# Patient Record
Sex: Female | Born: 1935
Health system: Southern US, Community
[De-identification: ages and names within clinical notes are randomized; demographics above are authoritative.]

## PROBLEM LIST (undated history)

## (undated) DIAGNOSIS — Z86718 Personal history of other venous thrombosis and embolism: Secondary | ICD-10-CM

## (undated) DIAGNOSIS — I10 Essential (primary) hypertension: Secondary | ICD-10-CM

## (undated) DIAGNOSIS — E785 Hyperlipidemia, unspecified: Secondary | ICD-10-CM

## (undated) DIAGNOSIS — E119 Type 2 diabetes mellitus without complications: Secondary | ICD-10-CM

## (undated) DIAGNOSIS — F329 Major depressive disorder, single episode, unspecified: Secondary | ICD-10-CM

## (undated) DIAGNOSIS — F32A Depression, unspecified: Secondary | ICD-10-CM

## (undated) HISTORY — DX: Depression, unspecified: F32.A

## (undated) HISTORY — DX: Major depressive disorder, single episode, unspecified: F32.9

## (undated) HISTORY — DX: Personal history of other venous thrombosis and embolism: Z86.718

## (undated) HISTORY — DX: Type 2 diabetes mellitus without complications: E11.9

## (undated) HISTORY — PX: LEG AMPUTATION: SHX1105

## (undated) HISTORY — DX: Essential (primary) hypertension: I10

## (undated) HISTORY — DX: Hyperlipidemia, unspecified: E78.5

---

## 2005-07-04 LAB — HEPATIC FUNCTION PANEL
ALT: 15 U/L (ref 7–35)
AST: 15 U/L (ref 13–35)
Alkaline Phosphatase: 84 U/L (ref 25–125)
Bilirubin, Total: 0.3 mg/dL

## 2005-07-04 LAB — BASIC METABOLIC PANEL
BUN: 9 mg/dL (ref 4–21)
Glucose: 149 mg/dL
POTASSIUM: 4.5 mmol/L (ref 3.4–5.3)
Sodium: 138 mmol/L (ref 137–147)

## 2005-07-04 LAB — LIPID PANEL
Cholesterol: 273 mg/dL — AB (ref 0–200)
HDL: 40 mg/dL (ref 35–70)
LDL Cholesterol: 197 mg/dL
LDL/HDL RATIO: 4.9
Triglycerides: 182 mg/dL — AB (ref 40–160)

## 2005-07-04 LAB — TSH: TSH: 1.62 u[IU]/mL (ref <=5.90)

## 2005-07-06 DIAGNOSIS — E139 Other specified diabetes mellitus without complications: Secondary | ICD-10-CM | POA: Insufficient documentation

## 2005-07-06 DIAGNOSIS — E785 Hyperlipidemia, unspecified: Secondary | ICD-10-CM | POA: Insufficient documentation

## 2005-07-06 HISTORY — DX: Hyperlipidemia, unspecified: E78.5

## 2005-07-18 ENCOUNTER — Ambulatory Visit: Payer: Self-pay | Admitting: Family Medicine

## 2005-08-12 ENCOUNTER — Ambulatory Visit: Payer: Self-pay | Admitting: Family Medicine

## 2011-12-17 ENCOUNTER — Ambulatory Visit: Payer: Self-pay | Admitting: Vascular Surgery

## 2011-12-17 LAB — CREATININE, SERUM
Creatinine: 0.41 mg/dL — ABNORMAL LOW (ref 0.60–1.30)
EGFR (African American): >60

## 2011-12-17 LAB — BUN: BUN: 9 mg/dL (ref 7–18)

## 2011-12-18 ENCOUNTER — Inpatient Hospital Stay: Payer: Self-pay | Admitting: Podiatry

## 2011-12-18 LAB — CBC WITH DIFFERENTIAL/PLATELET
Basophil #: 0.1 10*3/uL (ref 0.0–0.1)
Basophil %: 0.6 %
Basophil %: 0.8 %
Eosinophil #: 0.1 10*3/uL (ref 0.0–0.7)
Eosinophil %: 0.7 %
HCT: 31.2 % — ABNORMAL LOW (ref 35.0–47.0)
HGB: 10.4 g/dL — ABNORMAL LOW (ref 12.0–16.0)
HGB: 10.3 g/dL — ABNORMAL LOW (ref 12.0–16.0)
Lymphocyte #: 2.5 10*3/uL (ref 1.0–3.6)
Lymphocyte #: 2.7 10*3/uL (ref 1.0–3.6)
Lymphocyte %: 13.6 %
Lymphocyte %: 13.9 %
MCH: 27.6 pg (ref 26.0–34.0)
MCH: 27.9 pg (ref 26.0–34.0)
MCHC: 33.5 g/dL (ref 32.0–36.0)
MCHC: 33.4 g/dL (ref 32.0–36.0)
MCV: 83 fL (ref 80–100)
Monocyte #: 1.9 x10 3/mm — ABNORMAL HIGH (ref 0.2–0.9)
Monocyte %: 10.3 %
Neutrophil #: 13.8 10*3/uL — ABNORMAL HIGH (ref 1.4–6.5)
Neutrophil #: 14.6 10*3/uL — ABNORMAL HIGH (ref 1.4–6.5)
Neutrophil %: 74.8 %
Neutrophil %: 73.5 %
Platelet: 422 10*3/uL (ref 150–440)
Platelet: 410 10*3/uL (ref 150–440)
RBC: 3.78 10*6/uL — ABNORMAL LOW (ref 3.80–5.20)
RBC: 3.69 10*6/uL — ABNORMAL LOW (ref 3.80–5.20)
RDW: 13.7 % (ref 11.5–14.5)
RDW: 13.7 % (ref 11.5–14.5)
WBC: 18.5 10*3/uL — ABNORMAL HIGH (ref 3.6–11.0)
WBC: 19.8 10*3/uL — ABNORMAL HIGH (ref 3.6–11.0)

## 2011-12-18 LAB — SEDIMENTATION RATE: Erythrocyte Sed Rate: 119 mm/hr — ABNORMAL HIGH (ref 0–30)

## 2011-12-18 LAB — BASIC METABOLIC PANEL
BUN: 8 mg/dL (ref 7–18)
Chloride: 95 mmol/L — ABNORMAL LOW (ref 98–107)
Co2: 26 mmol/L (ref 21–32)
Creatinine: 0.58 mg/dL — ABNORMAL LOW (ref 0.60–1.30)
EGFR (Non-African Amer.): >60
Glucose: 396 mg/dL — ABNORMAL HIGH (ref 65–99)
Potassium: 4.5 mmol/L (ref 3.5–5.1)
Sodium: 131 mmol/L — ABNORMAL LOW (ref 136–145)

## 2011-12-20 LAB — CBC WITH DIFFERENTIAL/PLATELET
Eosinophil #: 0.2 10*3/uL (ref 0.0–0.7)
Eosinophil %: 1.1 %
HCT: 28.7 % — ABNORMAL LOW (ref 35.0–47.0)
Lymphocyte #: 2.9 10*3/uL (ref 1.0–3.6)
MCV: 82 fL (ref 80–100)
Monocyte %: 9.8 %
Neutrophil #: 13.4 10*3/uL — ABNORMAL HIGH (ref 1.4–6.5)
RBC: 3.5 10*6/uL — ABNORMAL LOW (ref 3.80–5.20)
WBC: 18.5 10*3/uL — ABNORMAL HIGH (ref 3.6–11.0)

## 2011-12-20 LAB — APTT: Activated PTT: 53.6 secs — ABNORMAL HIGH (ref 23.6–35.9)

## 2011-12-21 LAB — CBC WITH DIFFERENTIAL/PLATELET
Basophil #: 0.1 10*3/uL (ref 0.0–0.1)
Basophil %: 0.8 %
Eosinophil %: 1 %
HGB: 9.8 g/dL — ABNORMAL LOW (ref 12.0–16.0)
Lymphocyte #: 2.5 10*3/uL (ref 1.0–3.6)
MCV: 82 fL (ref 80–100)
Monocyte #: 1.7 x10 3/mm — ABNORMAL HIGH (ref 0.2–0.9)
Monocyte %: 9.4 %
Neutrophil %: 75.3 %
Platelet: 440 10*3/uL (ref 150–440)
RBC: 3.52 10*6/uL — ABNORMAL LOW (ref 3.80–5.20)
WBC: 18.5 10*3/uL — ABNORMAL HIGH (ref 3.6–11.0)

## 2011-12-21 LAB — APTT: Activated PTT: 56.4 secs — ABNORMAL HIGH (ref 23.6–35.9)

## 2011-12-22 LAB — CBC WITH DIFFERENTIAL/PLATELET
Eosinophil #: 0.2 10*3/uL (ref 0.0–0.7)
MCH: 28.3 pg (ref 26.0–34.0)
MCHC: 34.6 g/dL (ref 32.0–36.0)
Monocyte #: 1.6 x10 3/mm — ABNORMAL HIGH (ref 0.2–0.9)
Neutrophil #: 11.1 10*3/uL — ABNORMAL HIGH (ref 1.4–6.5)
Neutrophil %: 73.8 %
Platelet: 452 10*3/uL — ABNORMAL HIGH (ref 150–440)
RBC: 3.35 10*6/uL — ABNORMAL LOW (ref 3.80–5.20)

## 2011-12-22 LAB — BASIC METABOLIC PANEL
Anion Gap: 9 (ref 7–16)
BUN: 5 mg/dL — ABNORMAL LOW (ref 7–18)
Calcium, Total: 8.5 mg/dL (ref 8.5–10.1)
Chloride: 99 mmol/L (ref 98–107)
Osmolality: 269 (ref 275–301)
Potassium: 3.6 mmol/L (ref 3.5–5.1)
Sodium: 134 mmol/L — ABNORMAL LOW (ref 136–145)

## 2011-12-22 LAB — APTT: Activated PTT: 28.4 secs (ref 23.6–35.9)

## 2011-12-23 LAB — WBC: WBC: 13.2 10*3/uL — ABNORMAL HIGH (ref 3.6–11.0)

## 2011-12-23 LAB — APTT
Activated PTT: 36.5 secs — ABNORMAL HIGH (ref 23.6–35.9)
Activated PTT: 41.2 secs — ABNORMAL HIGH (ref 23.6–35.9)

## 2011-12-23 LAB — PROTIME-INR
INR: 1.1
Prothrombin Time: 14.9 secs — ABNORMAL HIGH (ref 11.5–14.7)

## 2011-12-24 LAB — PLATELET COUNT: Platelet: 476 10*3/uL — ABNORMAL HIGH (ref 150–440)

## 2011-12-24 LAB — PROTIME-INR: INR: 1.4

## 2011-12-24 LAB — HEMOGLOBIN: HGB: 8.2 g/dL — ABNORMAL LOW (ref 12.0–16.0)

## 2011-12-24 LAB — APTT: Activated PTT: 43.4 secs — ABNORMAL HIGH (ref 23.6–35.9)

## 2011-12-25 LAB — CBC WITH DIFFERENTIAL/PLATELET
Basophil #: 0.1 10*3/uL (ref 0.0–0.1)
Basophil %: 0.5 %
Eosinophil #: 0.3 10*3/uL (ref 0.0–0.7)
HCT: 26.2 % — ABNORMAL LOW (ref 35.0–47.0)
HGB: 8.8 g/dL — ABNORMAL LOW (ref 12.0–16.0)
Lymphocyte #: 3.1 10*3/uL (ref 1.0–3.6)
Lymphocyte %: 28.1 %
MCH: 27.9 pg (ref 26.0–34.0)
MCHC: 33.8 g/dL (ref 32.0–36.0)
MCV: 83 fL (ref 80–100)
Monocyte #: 1.3 x10 3/mm — ABNORMAL HIGH (ref 0.2–0.9)
Monocyte %: 11.8 %
Neutrophil #: 6.4 10*3/uL (ref 1.4–6.5)
Neutrophil %: 57.3 %
Platelet: 532 10*3/uL — ABNORMAL HIGH (ref 150–440)
WBC: 11.1 10*3/uL — ABNORMAL HIGH (ref 3.6–11.0)

## 2011-12-25 LAB — PROTIME-INR
INR: 3.5
Prothrombin Time: 35.1 secs — ABNORMAL HIGH (ref 11.5–14.7)

## 2011-12-25 LAB — SEDIMENTATION RATE: Erythrocyte Sed Rate: >140 mm/hr — ABNORMAL HIGH (ref 0–30)

## 2011-12-25 LAB — WOUND CULTURE

## 2011-12-26 LAB — PATHOLOGY REPORT

## 2011-12-30 LAB — WOUND CULTURE

## 2012-01-11 ENCOUNTER — Ambulatory Visit: Payer: Self-pay | Admitting: Podiatry

## 2012-01-15 LAB — WOUND CULTURE

## 2012-02-20 DIAGNOSIS — M869 Osteomyelitis, unspecified: Secondary | ICD-10-CM | POA: Insufficient documentation

## 2012-02-20 DIAGNOSIS — I96 Gangrene, not elsewhere classified: Secondary | ICD-10-CM | POA: Insufficient documentation

## 2012-03-03 ENCOUNTER — Ambulatory Visit: Payer: Self-pay | Admitting: Orthopaedic Surgery

## 2012-06-18 DIAGNOSIS — S88919A Complete traumatic amputation of unspecified lower leg, level unspecified, initial encounter: Secondary | ICD-10-CM | POA: Insufficient documentation

## 2012-08-18 ENCOUNTER — Emergency Department: Payer: Self-pay | Admitting: Emergency Medicine

## 2013-01-08 ENCOUNTER — Ambulatory Visit: Payer: Medicare Other | Attending: Infectious Diseases | Admitting: Physical Therapy

## 2013-01-08 DIAGNOSIS — Z5189 Encounter for other specified aftercare: Secondary | ICD-10-CM | POA: Insufficient documentation

## 2013-01-08 DIAGNOSIS — R5381 Other malaise: Secondary | ICD-10-CM | POA: Insufficient documentation

## 2013-01-08 DIAGNOSIS — S88119A Complete traumatic amputation at level between knee and ankle, unspecified lower leg, initial encounter: Secondary | ICD-10-CM | POA: Insufficient documentation

## 2013-01-08 DIAGNOSIS — R269 Unspecified abnormalities of gait and mobility: Secondary | ICD-10-CM | POA: Insufficient documentation

## 2013-10-06 LAB — HEMOGLOBIN A1C: Hgb A1c MFr Bld: 7.1 % — AB (ref 4.0–6.0)

## 2014-08-31 NOTE — Consult Note (Signed)
Brief Consult Note: Diagnosis: 1. Diabetic foot infection and gangrene.   Patient was seen by consultant.   Consult note dictated.   Recommend further assessment or treatment.   Orders entered.   Comments: Given PVD, very poorly controlled Dm, some deeper purulence noted at surgery and unhealthy appearance of wound would rec at least 2 weeks of IV abx - place picc (order placed) - d/c vanco (No MRSA identified) and cipro (needs better coverage including anaerobes) - start ertapenem for today - will discuss with home health whether they can provide zosyn pump which makes it easier to dose - if not may need the ertapenem for ease of dosing. Thank you for consult. Will follow with you and would like to see pt as otpt prior to changes in her abx regimen.  Electronic Signatures: Angelena Form (MD)  (Signed 12-Aug-13 19:18)  Authored: Brief Consult Note   Last Updated: 12-Aug-13 19:18 by Angelena Form (MD)

## 2014-08-31 NOTE — Consult Note (Signed)
Brief Consult Note: Comments: 81 yr old female admitted to podiatry services for possible right foot amputation/ for non healing ulcer on right foot;get wound cultures,continue cipro and clinda,get basic labs and EKG htn:no h/o htn;add bbloker will follow.  Electronic Signatures: Epifanio Lesches (MD)  (Signed 06-Aug-13 18:55)  Authored: Brief Consult Note   Last Updated: 06-Aug-13 18:55 by Epifanio Lesches (MD)

## 2014-08-31 NOTE — Op Note (Signed)
PATIENT NAME:  Melanie Diaz, Melanie Diaz MR#:  [id number] DATE OF BIRTH:  [birth year]  DATE OF PROCEDURE:  12/21/2011  PREOPERATIVE DIAGNOSIS: Right foot gangrene.   POSTOPERATIVE DIAGNOSIS: Right foot gangrene.   PROCEDURE: Transmetatarsal amputation right midfoot.   SURGEON: Raymond Azure A. Vickki Muff, DPM  COMPLICATIONS: None.   SPECIMEN: Swab for culture and sensitivity as well as forefoot amputation for pathology.   COMPLICATIONS: None.   HEMOSTASIS: None.   ANESTHESIA: MAC with ankle block.   OPERATIVE INDICATIONS: This is a 81 year old female who underwent angioplasty for blockages to her lower extremity who presented to the outpatient clinic with gangrenous changes of her great toe and second toe and was admitted. She was found to have an elevated white blood cell count with worsening discoloration of her third toe. We have discussed surgical intervention including a transmetatarsal amputation and she has given consent for this procedure.   DESCRIPTION OF PROCEDURE: The patient was brought into the OR and placed on the operating table in the supine position. IV sedation was administered by the anesthesia team. An ankle block was placed by myself. The right lower extremity was prepped and draped in the usual sterile fashion. Attention was directed to the forefoot area where basically the first and second metatarsals all the way to the base were noted to be quite gangrenous. The incision was mapped out. A full thickness incision was made along the base of the first and second metatarsals. This was carried distally to the third MTPJ lateral to the fifth MTPJ with a full thickness incision plantar from the fifth to the third MTPJ and brought back once again more proximal on the first metatarsal base area due to the gangrenous changes and brought back around the midfoot. Full thickness incision was made. The lateral portion was freed of the overlying soft tissue from the bone. At this time with a power saw at  the base of the first metatarsal keeping the metatarsal parabola intact, the metatarsals were all osteotomized one through five. These were disarticulated and removed from the surgical field in toto. A laterally based flap from the third to the fifth MTPJ had been created with this. There was noted to be a fair amount of purulent drainage throughout the medial one-half of this forefoot amputation site. There was noted to be some necrotic changes to the muscle and deep layers. There was noted to be areas of purulent drainage coursing along the dorsal extensor hallucis longus tendon as well as into the deep midfoot space where the FDL and FHL were coursing from as well as medially along the first ray. This was all debrided, opened up, and flushed out. All areas were then debrided with a Versajet. The laterally based flap was brought over medially. I was able to obtain a fairly good closure with this. Some dog ears were trimmed back. The medial portion of the incision site was left and packed open where most of the purulent drainage was noted. The dorsal and medial portion was closed loosely with nylon. A bulky sterile dressing was placed on the right foot. She tolerated the procedure and anesthesia well and was transported from the OR to the PAC-U with all vital signs stable and neurovascular status intact. Good perfusion to the flap was noted. There was not noted to be any severe bleeding from the wound. There was no obvious open arterial areas.   She will be readmitted to the floor. Will continue with vancomycin awaiting further culture results. Possibly place a Wound  VAC to this to assist with closure. She is at high risk of undergoing a below-the-knee amputation given the severe gangrenous changes and infection to the area. Will monitor her white blood cell count as well.   ____________________________ Pete Glatter. Vickki Muff, DPM jaf:drc D: 12/21/2011 15:28:33 ET T: 12/21/2011 16:14:42  ET JOB#: [id number]  cc: Larkin Ina A. Vickki Muff, DPM, <Dictator> Edna Rede DPM ELECTRONICALLY SIGNED 12/28/2011 9:36

## 2014-08-31 NOTE — Op Note (Signed)
PATIENT NAME:  Melanie Diaz, Melanie Diaz MR#:  [id number] DATE OF BIRTH:  [birth year]  DATE OF PROCEDURE:  12/17/2011  PREOPERATIVE DIAGNOSES:  1. Peripheral arterial disease with gangrene, right lower extremity.  2. Hypertension.  3. Ulceration, right foot.   POSTOPERATIVE DIAGNOSES: 1. Peripheral arterial disease with gangrene, right lower extremity.  2. Hypertension.  3. Ulceration, right foot.  PROCEDURES:  1. Ultrasound guidance for vascular access, left femoral artery.  2. Catheter placement into right dorsalis pedis artery from left femoral approach.  3. Aortogram and selective right lower extremity angiogram.  4. Percutaneous transluminal angioplasty right dorsalis pedis artery with 2 mm diameter angioplasty balloon.  5. Percutaneous transluminal angioplasty of right anterior tibial artery with 3 mm diameter angioplasty balloon.  6. Percutaneous transluminal angioplasty of long segment right SFA and popliteal artery for chronic total occlusion with 5 mm diameter angioplasty balloon.  7. Self-expanding stent x2 to right SFA and above-knee popliteal artery for greater than 50% residual stenosis and dissection after angioplasty.  8. StarClose closure device left femoral artery.   SURGEON: Algernon Huxley, MD   ANESTHESIA: Local with moderate conscious sedation.   ESTIMATED BLOOD LOSS: 25 mL.  FLUOROSCOPY TIME: Approximately 15 minutes.  CONTRAST USED: 120 mL.   INDICATION FOR PROCEDURE: This is a 81 year old white female who I saw in the office last week for nonhealing ulceration of the right lower extremity with an ABI of 0.27. She was set up for angiogram within a week's time. Since that time she has progressed to some gangrenous changes of the left great toe with worsening pain. We discussed that for any attempt at limb salvage revascularization will be required and she is brought here for angiography with possible revascularization today. Risks and benefits were discussed. Informed  consent was obtained.   DESCRIPTION OF PROCEDURE: The patient is brought to the Vascular Interventional Radiology Suite. Groins were shaved and prepped and a sterile surgical field was created. The left femoral head was localized with fluoroscopy. The left femoral artery was visualized with ultrasound and accessed under direct ultrasound guidance without difficulty with a Seldinger needle. A J-wire and 5 French sheath was placed. Pigtail catheter was placed in the aorta at the L1 level and AP aortogram was performed. This showed patent renal arteries bilaterally. The distal aorta was highly calcific but not stenotic and the iliacs appeared patent. I crossed the aortic bifurcation with a Terumo advantage wire and the pigtail catheter without difficulty and advanced the pigtail catheter into the right common femoral artery. Selective right lower extremity angiogram was then performed. This showed the first few centimeters of the superficial femoral artery to be patent but then was occluded over short to medium segment. It reconstituted in the distal superficial femoral artery and then re-occluded a few centimeters more distally and did not reconstitute until the popliteal artery around the level of the knee. There appeared to be an anterior tibial artery which was the best runoff distally. The peroneal artery came off the same level as the anterior tibial artery. The posterior tibial artery was not well seen but there was a vessel at the level of the knee that may have been an aberrant origin of the posterior tibial artery which was occluded. It did not reconstitute distally to be able to evaluate this further. The patient was given 4000 units of intravenous heparin for systemic anti-coagulation and a 6 French Ansel sheath was placed over a Terumo advantage wire. With a mild amount of difficulty I was  able to cross the lesions. I was able to redirect the wire into the true lumen and the below-knee popliteal artery  and confirm intraluminal flow distally. The anterior tibial artery was the best runoff but there was some disease proximally within the anterior tibial artery. Distal opacification into the foot was not particularly brisk initially but there was some disease within the foot as well. A 5 mm diameter angioplasty balloon was inflated from the below-knee popliteal artery to the proximal superficial femoral artery. Wastes were taken in multiple locations. Following angioplasty there was significant dissection and stenosis throughout much of the mid to distal superficial femoral artery and above-knee popliteal artery. Two 6 mm diameter self-expanding stents were used to treat from just above the knee to the proximal superficial femoral artery to resolve this. This was ironed out with a 5 mm balloon with good angiographic completion result. However, runoff distally appeared somewhat limited. The anterior tibial artery flow was stenotic proximally and this was the best runoff to the foot and there was disease within the foot. I was able taken an 0.018 wire and park this into the first digital artery and a 3 mm diameter angioplasty balloon was inflated in the proximal anterior tibial artery with excellent angiographic completion result. A 2 mm diameter angioplasty balloon was inflated in the dorsalis pedis artery. It was a bit difficult to see how much improved the flow was given patient motion but there was flow into the foot at this point in line from a sheath parked in the common femoral artery. At this point I elected to terminate the procedure. The sheath was pulled back to the ipsilateral external iliac artery and oblique arteriogram was performed. StarClose closure device was deployed in the usual fashion with excellent hemostatic result. The patient tolerated the procedure well and was taken to the recovery room in stable condition.   ____________________________ Algernon Huxley, MD jsd:drc D: 12/17/2011 11:19:39  ET T: 12/17/2011 11:42:12 ET JOB#: [id number]  cc: Algernon Huxley, MD, <Dictator> Richard L. Rosanna Randy, MD Algernon Huxley MD ELECTRONICALLY SIGNED 12/19/2011 11:01

## 2014-08-31 NOTE — Consult Note (Signed)
PATIENT NAME:  Melanie Diaz, Melanie Diaz MR#:  [id number] DATE OF BIRTH:  11/13/1933  DATE OF CONSULTATION:  12/24/2011  REFERRING PHYSICIAN:  Samara Deist, DPM  CONSULTING PHYSICIAN:  Cheral Marker. Ola Spurr, MD  REASON FOR CONSULT:  Antibiotic management of a complicated diabetic foot infection.  HISTORY OF PRESENT ILLNESS:  Melanie Diaz is a 81 year old with very poorly controlled diabetes who was admitted on 08/06 for gangrenous infected foot. She had had a very poor vascular supply to that foot and had a nonhealing ulcer on the right foot for several months.  She was being followed by Dr. Lucky Cowboy in vascular and Dr. Vickki Muff in podiatry.   She underwent transmetatarsal amputation of the right foot by Dr. Vickki Muff on 08/09. We are consulted for antibiotic management and review of her complicated culture data.  The patient apparently was not aware of her diabetes prior to this admission.  She was not seeking regular medical care at that time.  Since the amputation the patient continues to have some pain in the foot and some bleeding from the site that is reported as slowly improving.  PAST MEDICAL HISTORY: 1. Previously undiagnosed diabetes. 2. Peripheral vascular disease followed by Dr. Lucky Cowboy.  PAST SURGICAL HISTORY:  None.  ALLERGIES:  The patient is allergic to Augmentin, which causes stomach upset, and Keflex with an unclear reaction.  SOCIAL HISTORY:  The patient is married.  She does not smoke, does not drink.  FAMILY HISTORY:  No hypertension or coronary artery disease.  REVIEW OF SYSTEMS:  11 systems reviewed and negative except as per History of Present Illness.   MEDICATIONS:  Currently the patient is on: 1. Ciprofloxacin 500 mg twice a day orally. 2. Metoprolol 25 mg twice a day. 3. Sliding scale insulin. 4. Percocet p.r.n.  5. Morphine p.r.n.  6. Insulin detemir 20 units once a day. 7. Metformin 500 twice a day. 8. Magnesium hydroxide. 9. Robaxin. 10. Vancomycin 750 q. 18  hours. 11. Lovenox. 12. Coumadin. 13. Glucerna.  PHYSICAL EXAMINATION: VITAL SIGNS:  Temperature over the last 24 hours has been 100.3, pulse 88, blood pressure 127/56, pulse oximetry 90%.  GENERAL:  She is a somewhat disheveled, elderly woman in no acute distress.  HEENT:  Pupils equal, round, reactive to light and accommodation. Extraocular movements intact.  Oropharynx is clear.  NECK:  Supple.  HEART:  Regular.  LUNGS:  Clear.  ABDOMEN:  Soft, nontender, nondistended.  EXTREMITIES:  She has no edema in her left foot.  On her right foot she has a transmetatarsal amputation site with some dusky appearance at the site of the stitches.  The right foot is somewhat warm.  There is no purulence but some erythema around the margins of the operative site.  DATA:  The patient's white blood count on admission was elevated at 18.5 but has decreased today since the surgery and antibiotics to 15.1 on 08/10.  Hemoglobin 9.5, platelets 452.  White blood count on 08/11 is 13.2.  Creatinine on 08/10 was normal at 0.42 and BUN 5.  Estimated GFR of greater than 60.    Microbiology data from intraoperative cultures on 08/09 grew heavy growth of group B strep.  Preoperative cultures of necrotic area that was debrided grew the following organisms: 1. Methicillin-sensitive Staphylococcus aureus that was initially reported as resistant to vancomycin but on follow-up testing is relatively sensitive, resistant only to clindamycin.  2. Group B strep, heavy growth. 3. Moderate growth of Providencia, sensitive to ceftriaxone, ciprofloxacin, ceftazidime, imipenem, Bactrim, levofloxacin,  and cefoxitin.  It is resistant to cefazolin and ampicillin. 4. Acinetobacter lwoffi. This was sensitive to ceftriaxone, ciprofloxacin, gentamicin, ceftazidime, and imipenem.  It was intermediate to levofloxacin. 5. Bacteroides stercoris, beta-lactamase positive.  IMPRESSION:  Gangrenous foot in a woman with very poorly  controlled diabetes, peripheral vascular disease, now status post transmetatarsal amputation.  Per my discussion with Dr. Vickki Muff, there was necrotic material and areas of purulence in the remaining part of the foot that was not amputated.  This was debrided and washed out.  Her white count has decreased but she is still having low-grade fevers and the site looks dusky, somewhat erythematous, and warm.  At this point I think she would benefit from her multiple comorbidities and poor vascular supply from at least a two-week course of IV antibiotics.  Given the recent identification of the Bacteroides, we would definitely need to add anaerobic coverage, which is currently lacking.   RECOMMENDATIONS: 1. Place a PICC line for at least two weeks, potentially four, of antibiotics. 2. At this point I would recommend dosing with a carbapenem in order to achieve the best coverage with the simplest regimen.  Another option would be Zosyn, although it may not be as easy to administer this at home.  I will check with home infusion as to whether they have continuous pumps that can be used to administer Zosyn. 3. The foot should be monitored closely and she may end up needing below-the-knee amputation. 4. She needs better control of her diabetes and her primary care physician needs to be working on this. 5. Following the IV antibiotic course I will see her as an outpatient and may need to extend an oral antibiotic course to ensure clearance of this infection.     Thank you very much for the consult.  We will be glad to follow along with you.    ____________________________ Cheral Marker. Ola Spurr, MD dpf:bjt D: 12/24/2011 19:03:04 ET T: 12/25/2011 10:15:41 ET JOB#: [id number]  cc: Cheral Marker. Ola Spurr, MD, <Dictator> DAVID Ola Spurr MD ELECTRONICALLY SIGNED 01/01/2012 12:36

## 2014-08-31 NOTE — Op Note (Signed)
PATIENT NAME:  Melanie Diaz, Melanie Diaz MR#:  [id number] DATE OF BIRTH:  08-05-1933  DATE OF PROCEDURE:  01/11/2012  PREOPERATIVE DIAGNOSIS: Gangrene transmetatarsal amputation flap, right foot.   POSTOPERATIVE DIAGNOSIS: Gangrene transmetatarsal amputation flap, right foot.   PROCEDURES:  1. Debridement of necrotic gangrenous tissue. 2. Preparation of wound site for skin graft placement.  3. Application of Apligraf skin graft.  4. Application of wound VAC, right foot.   SURGEON: Edilia Ghuman A. Vickki Muff, DPM  COMPLICATIONS: None.   SPECIMEN: Wound culture, right foot.   HEMOSTASIS: None.   ANESTHESIA: MAC, IV sedation with ankle block.   OPERATIVE INDICATIONS: This is a 81 year old female who has been seen in the outpatient clinic. She underwent a transmetatarsal amputation approximately 2-1/2 weeks ago. She had evidence of necrosis of the skin flap, developed a full thickness gangrenous changes to this. She presents today for debridement of the skin flap, revision of the wound bed, placement of an Apligraf and a wound VAC. All risks, benefits, alternatives, and complications associated with the procedure were discussed with the patient and informed consent has been given.   DESCRIPTION OF PROCEDURE: Patient was brought into the OR, placed the operating table in the supine position. IV sedation was administered by the anesthesia team. An ankle block was placed by myself. The right lower extremity was then prepped and draped in the usual sterile fashion. Attention was directed to the right foot where after exposure of the skin flap there was noted to be a full thickness necrosis of the skin flap to the right foot. This was the medial one-half of the transmetatarsal amputation flap. This was debrided of all necrotic tissue. This was taken down with some mild superficial residual skin that was left in the area but approximately 80% of the flap full thickness had died. I did debride the skin edges and deeper  tissue with the Versajet. I did debride and remove with a power saw a small amount of bone to allow for bleeding into the tissues and mild skin closure dorsally. This was basically along the navicular cuneiform region. At this time a wound culture was taken. I then prepped the Apligraf. Basically the complete sheet of Apligraf, 44 sq cm, were applied to the wound bed. This had been fenestrated to allow for drainage. This was then covered with a Mepitel nonadhesive covering and a sponge for the wound VAC was placed over top of this and the wound VAC was then fixated to the area with a good seal noted. No active bleeding was noted during the procedure, just mild drainage from the bone and superficial skin as expected. This was then covered with a light gauze dressing. She was transported from the Operating Room to the PAC-U with all vital signs stable and neurovascular status intact. She will be readmitted to be nursing facility. Her Coumadin will be restarted tomorrow evening. Orders have already been written for this. I will see her in the outpatient clinic in five days where I will perform the wound VAC change at that point.   ____________________________ Pete Glatter. Vickki Muff, DPM jaf:cms D: 01/11/2012 13:08:52 ET T: 01/11/2012 13:25:16 ET JOB#: [id number]  cc: Larkin Ina A. Vickki Muff, DPM, <Dictator>  Tong Pieczynski DPM ELECTRONICALLY SIGNED 01/15/2012 13:37

## 2014-08-31 NOTE — Consult Note (Signed)
Patient admitted with worsening gangrene of the toes of the right foot.site well healedpulse now presentas if she will need a TMA or at least multiple toes removed, and this is being considered for later this week.follow  Electronic Signatures: Algernon Huxley (MD)  (Signed on 07-Aug-13 14:44)  Authored  Last Updated: 07-Aug-13 14:44 by Algernon Huxley (MD)

## 2014-08-31 NOTE — Consult Note (Signed)
Brief Consult Note: Diagnosis: Infected foot wound- s/p amputation.   Recommend further assessment or treatment.   Orders entered.   Comments: Called by Dr Vickki Muff to review micro results which I have done and also discussed with lab tech (although no one available in micro until tomorrow AM.  An MSSA isolate that is resistant to vancomycin would be EXCEEDINGLY Rare.  The sensitivies for some of the other organisms also do not make sense.   I have asked micro to call me first thing in the morning.  Per report pt does not appear septic and wbc has inproved since amputation.   ABX - all organisms isolated so far are sensitive to ciprofloxacin so it seems reasonable to continue this. Will add vanco until the true sensitivies of the Staph aureus are elucidated. - consider adding anaerobic coverage but pt is intolerant of augmentin.  WIll decide final abx recs once pt examined and after discussion with lab.  Thank you for the consult.  Full consult to follow tomorrow in AM.  Electronic Signatures: Angelena Form (MD)  (Signed 11-Aug-13 20:38)  Authored: Brief Consult Note   Last Updated: 11-Aug-13 20:38 by Angelena Form (MD)

## 2014-08-31 NOTE — Discharge Summary (Signed)
PATIENT NAME:  Melanie Diaz, Melanie Diaz MR#:  [id number] DATE OF BIRTH:  May 08, 1934  DATE OF ADMISSION:  12/18/2011 DATE OF DISCHARGE:  12/25/2011  REASON FOR ADMISSION: Right foot gangrene.   HISTORY OF PRESENT ILLNESS: This is a 81 year old female who presented to our outpatient clinic after undergoing an angioplasty early in the week. On evaluation on August 6th she had noted worsening gangrenous changes to her right foot. She was found to be in a fair amount of pain with erythema and admitted to the hospital for IV antibiotics and further work-up with impending surgery.   PERTINENT LABS UPON ADMISSION: White blood cell count 18.5, increased to 19.8. Serum glucose was 396. Her hemoglobin A1c on August 7th was 12.8. White blood cell count upon admission was 11.1, hemoglobin 8.8 with hematocrit of 26.2. Micro revealed intraoperatively a Group B strep pansensitive. A bedside lab revealed Staph aureus Group B strep, Acinetobacter, bacteroides.   PERTINENT RADIOLOGY STUDIES: Color flow Doppler of her lower extremities revealing bilateral lower extremity nonocclusive thrombus in the common femoral popliteal veins bilaterally.   HOSPITAL COURSE: She was admitted on August 6th. Internal Medicine was consulted at that time. She was started on IV clindamycin and p.o. ciprofloxacin. Bedside debridement was performed and a culture was sent off at that time. Vascular Surgery did evaluate her on August 7th and also agreed with transmetatarsal amputation as being her likely outcome for possible healing. With her history of multiple DVTs, she was started on a heparin drip which was discontinued six hours prior to surgical intervention. On August 9th she underwent a transmetatarsal amputation in the afternoon after six hours discontinuation of the heparin drip. She was noted to have multiple bacteria and was on vancomycin for a period of time. This was discontinued. There was some concern as far as a possible resistant  bacteria though after further lab evaluation this was found to be a lab error and she did not have resistant bacteria to Group B Strep or Staph aureus. ID was consulted on August 12th for their assistance with antibiotics. Upon discharge he felt that she could be discharged with 1 gram of ertapenem daily for two weeks. Her anemia of chronic disease was felt to be likely multifactorial in volume depletion and will be left up to primary care for further evaluation upon discharge. Her hypertension was well controlled at discharge and her blood sugars were under much better control at discharge. She will be discharged to a skilled nursing facility for physical therapy to assist with her balance issues and possible transfer. Continue with IV antibiotics. She is going to follow-up with myself in two weeks. She will follow-up with Infectious Disease in two weeks. She did have some chronic back pain. She has been dealing with this for some time. Will try to have her follow-up with Orthopedics in approximately two weeks as well.   ____________________________ Pete Glatter. Vickki Muff, DPM jaf:drc D: 12/25/2011 13:11:33 ET T: 12/26/2011 12:06:50 ET JOB#: [id number]  cc: Larkin Ina A. Vickki Muff, DPM, <Dictator> Arlette Schaad DPM ELECTRONICALLY SIGNED 12/28/2011 9:36

## 2014-08-31 NOTE — Consult Note (Signed)
PATIENT NAME:  Melanie Diaz, Melanie Diaz MR#:  [id number] DATE OF BIRTH:  01/04/1934  DATE OF CONSULTATION:  12/18/2011  REFERRING PHYSICIAN:  Dr. Samara Deist  CONSULTING PHYSICIAN:  Epifanio Lesches, MD  PRIMARY CARE PHYSICIAN: Dr. Rosanna Randy   CHIEF COMPLAINT: Gangrene of the right foot.   HISTORY OF PRESENT ILLNESS: 81 year old female with no significant past medical history and does not follow with PCP on regular basis came in because patient is admitted to podiatry service for right foot gangrene. Patient has been having nonhealing ulcer on the right foot for about three months, had an angiogram with revascularization by Dr. Lucky Cowboy yesterday and she went to follow up with Dr. Lucky Cowboy because ulcer was not healing and the toe was turning to purple color. She was referred to podiatry service with Dr. Vickki Muff and he recommended admission for IV antibiotics and also possible partial foot amputation. I was asked for medical consult for preop clearance. Patient seen at the bedside and denies chest pain, trouble breathing. No nausea. No vomiting. Has some low back pain and also right foot pain, received some pain medication.  PAST MEDICAL HISTORY: No hypertension or diabetes.   ALLERGIES: Patient allergic to Augmentin and Keflex.   SOCIAL HISTORY: No smoking. No drinking.   MEDICATIONS: Patient is right now on Cipro 500 mg q.12 and clinda 600 mg IV q.6 hours and also on acetaminophen with oxycodone tablets.   PAST SURGICAL HISTORY: None.   FAMILY HISTORY: No hypertension or coronary artery disease.   REVIEW OF SYSTEMS: CONSTITUTIONAL: Complains of low back pain. No fatigue. EYES: No blurred vision. RESPIRATORY: No cough, no hemoptysis. CARDIOVASCULAR: No chest pain. No orthopnea. GASTROINTESTINAL: No nausea, no vomiting, no abdominal pain. GENITOURINARY: No dysuria. ENDOCRINE: No polydipsia, nocturia. INTEGUMENTARY: No skin rashes. MUSCULOSKELETAL: Complains of low back pain. NEUROLOGIC: No numbness or  weakness. PSYCH: No anxiety or insomnia.   PHYSICAL EXAMINATION:  VITAL SIGNS: Temperature 99 Fahrenheit, pulse 96, respirations 18, blood 171/78, sats 98% on room air.   GENERAL: She is alert, awake, oriented, cooperative, not in distress.   HEENT: Head atraumatic, normocephalic. Pupils equally reacting to light. Extraocular movements are intact. ENT: No tympanic membrane congestion. No turbinate hypertrophy. No oropharyngeal erythema.   NECK: Normal range of motion. No JVD. Thyroid not enlarged. No lymphadenopathy. No carotid bruits.    RESPIRATORY: Clear to auscultation. No wheeze. No rales. Patient is not using accessory muscles of respiration.   CARDIOVASCULAR: S1 and S2 regular. PMI not displaced. Patient has slight tachycardia, about 96 beats per minute.   ABDOMEN: Soft, nontender, nondistended. Bowel sounds present.   SKIN: Patient had left foot small superficial ulcer present between first and second toes and right foot as gangrenous changes in the first and second toes and also to the dorsum with foot dorsal aspect patient has some blackish discoloration of the great toe and dressing present.   EXTREMITIES: Patient has decreased sensations on the right foot. Pulse is palpable on the left side dorsalis pedis and femoral artery on the right side. Patient has weak pulses on the dorsalis pedis.   NEUROLOGIC: Cranial nerves II through XII intact. Deep tendon reflexes 2+. Sensory sensations are diminished on the right foot. No dysarthria or aphasia.   PSYCH: Oriented to time, place, person.   LABORATORY, DIAGNOSTIC AND RADIOLOGICAL DATA: Right foot x-ray showed chronic changes. No definite bony lesion, underlying osteopenia. WBC 18.5, hemoglobin 10.4, hematocrit 31.2, platelets 422, ESR 119, BUN 9, creatinine 0.41, that was yesterday.   ASSESSMENT  AND PLAN: Patient is a 78-year-old female with peripheral vascular disease with right lower extremity gangrene. Patient had angiogram of the  right lower extremity with stent to the right superior femoral artery yesterday. Patient has some ischemic changes with gangrene and also elevated white count and ESR so admitted to podiatry service for possible partial foot amputation and IV antibiotics. She is on appropriate stuff with Cipro and clindamycin and wound cultures are pending. Continue the IV antibiotics. Patient has hypertension, no prior history of high blood pressure but blood pressure is elevated here at 171/78, heart rate 96 so I started her on beta blockers. Follow the clinical response. Obtain basic labs with CBC and CMP and EKG. Patient's family says she was pretty active before without we will follow along and make further recommendations about her preop clearance. At this time we are waiting for further blood work.  TIME SPENT ON CONSULTATION: About 55 minutes.  ____________________________  , MD sk:cms D: 12/18/2011 18:57:47 ET T: 12/19/2011 09:25:28 ET JOB#: [id number]  cc:  , MD, <Dictator> Richard L. Gilbert, MD   MD ELECTRONICALLY SIGNED 01/24/2012 17:53 

## 2014-08-31 NOTE — Consult Note (Signed)
PATIENT NAME:  Melanie Diaz, Melanie Diaz MR#:  [id number] DATE OF BIRTH:  Jul 03, 1933  DATE OF CONSULTATION:  12/19/2011  REFERRING PHYSICIAN:   CONSULTING PHYSICIAN:  Algernon Huxley, MD  REASON FOR CONSULTATION: Worsening gangrene of the toes of the right foot.   HISTORY OF PRESENT ILLNESS: This is a 81 year old white female who was admitted to the podiatry service last night with worsening gangrenous changes to the toes on the right foot. The first toe is now frankly gangrenous, the second and third toes have cyanosis with early gangrenous changes as well as does the metatarsal head area. The patient has undergone revascularization of the right lower extremity for very severe ischemia within the last week and her pain is now nearly unbearable and she is no longer able to really ambulate. Her access site from her procedure seems well healed and she is not having any pain or complaints in this region.   PAST MEDICAL HISTORY: Worsening nonhealing ulceration right foot. She is a diabetic.   PAST SURGICAL HISTORY: Percutaneous revascularization of right foot.   ALLERGIES: Augmentin and Keflex.   MEDICATIONS:  1. Cipro. 2. Pain medications. 3. Started on metformin.   SOCIAL HISTORY: No alcohol or tobacco abuse.   FAMILY HISTORY: No coronary disease or hypertension.   REVIEW OF SYSTEMS: GENERAL: Positive for fever. No unintentional weight loss or gain. EYES: No blurred or double vision. EARS: No tinnitus or ear pain. CARDIOVASCULAR: No palpitations or chest pain. GASTROINTESTINAL: No nausea, vomiting, diarrhea. GENITOURINARY: No dysuria, hematuria. ENDOCRINE: No heat or cold intolerance. MUSCULOSKELETAL: Positive for low back pain and right foot pain. NEUROLOGIC: No transient ischemic attack, stroke, or seizure. PSYCH: No anxiety or depression. SKIN: Gangrenous changes to right foot.   PHYSICAL EXAMINATION:  VITAL SIGNS: Temperature 100.2, pulse 77, blood pressure 153/66, saturations 96% on room air.    HEENT: Normocephalic and atraumatic. Eyes: Sclerae nonicteric. Conjunctivae are clear. Ears: Normal external appearance. Hearing is intact.  NECK: Supple without adenopathy or jugular venous distention.   HEART: Regular rate and rhythm. No jugular venous distention.   LUNGS: Clear bilaterally without increased work of respiration.   ABDOMEN: Soft, nondistended, nontender.   EXTREMITIES: Her access site in her left groin is well healed. There is a palpable dorsalis pedis pulse in the right foot now that was not present previously. There is capillary refill to the mid foot. The right great toe is clearly nonviable. The right second toe appears nonviable. The  base of the right third toe has cyanosis and likely early gangrenous changes. These changes extend to the first metatarsal head area and the area is tender. The left foot has no open ulcerations.   NEUROLOGIC: Normal strength and tone in all four extremities.   PSYCH: Normal affect and mood.   LABORATORY, DIAGNOSTIC, AND RADIOLOGICAL DATA: White blood cell count 19.8, hemoglobin 10.3, platelet count 410,000, sodium 131, potassium 4.5, chloride 95, CO2 26, BUN 8, creatinine 0.58, glucose 396. Hemoglobin A1c is 12.8 from completely uncontrolled diabetes.   ASSESSMENT AND PLAN: This is a 81 year old white female with progression of gangrenous changes to her right foot. She had undergone revascularization. She has what appears to be horribly controlled diabetes and a medical consult has also been placed. The patient reports severe pain and is scheduled for removal of her toes later this week. I hope with revascularization she will be able to salvage a transmetatarsal amputation but limb loss is still very likely particularly if her sugars do not get under  better control and there is underlying purulence or infection at the time of surgery. This is a very serious situation with a high risk of limb loss and this is discussed again with the family  and patient.   This is a level-4 consultation.   ____________________________ Algernon Huxley, MD jsd:cms D: 01/02/2012 10:04:25 ET T: 01/02/2012 10:26:11 ET JOB#: [id number]  cc: Algernon Huxley, MD, <Dictator> Algernon Huxley MD ELECTRONICALLY SIGNED 01/02/2012 14:15

## 2014-11-16 DIAGNOSIS — S88919A Complete traumatic amputation of unspecified lower leg, level unspecified, initial encounter: Secondary | ICD-10-CM | POA: Insufficient documentation

## 2014-11-16 DIAGNOSIS — F32A Depression, unspecified: Secondary | ICD-10-CM | POA: Insufficient documentation

## 2014-11-16 DIAGNOSIS — J309 Allergic rhinitis, unspecified: Secondary | ICD-10-CM | POA: Insufficient documentation

## 2014-11-16 DIAGNOSIS — G547 Phantom limb syndrome without pain: Secondary | ICD-10-CM | POA: Insufficient documentation

## 2014-11-16 DIAGNOSIS — G4700 Insomnia, unspecified: Secondary | ICD-10-CM | POA: Insufficient documentation

## 2014-11-16 DIAGNOSIS — I739 Peripheral vascular disease, unspecified: Secondary | ICD-10-CM | POA: Insufficient documentation

## 2014-11-16 DIAGNOSIS — E119 Type 2 diabetes mellitus without complications: Secondary | ICD-10-CM | POA: Insufficient documentation

## 2014-11-16 DIAGNOSIS — F329 Major depressive disorder, single episode, unspecified: Secondary | ICD-10-CM | POA: Insufficient documentation

## 2014-12-15 ENCOUNTER — Ambulatory Visit: Payer: Self-pay | Admitting: Family Medicine

## 2014-12-20 ENCOUNTER — Encounter: Payer: Self-pay | Admitting: Family Medicine

## 2014-12-20 ENCOUNTER — Ambulatory Visit (INDEPENDENT_AMBULATORY_CARE_PROVIDER_SITE_OTHER): Payer: Medicare Other | Admitting: Family Medicine

## 2014-12-20 VITALS — BP 132/62 | HR 84 | Temp 97.9°F | Resp 16

## 2014-12-20 DIAGNOSIS — E139 Other specified diabetes mellitus without complications: Secondary | ICD-10-CM

## 2014-12-20 DIAGNOSIS — I10 Essential (primary) hypertension: Secondary | ICD-10-CM | POA: Diagnosis not present

## 2014-12-20 DIAGNOSIS — E785 Hyperlipidemia, unspecified: Secondary | ICD-10-CM | POA: Diagnosis not present

## 2014-12-20 NOTE — Progress Notes (Signed)
Patient ID: Melanie Diaz, female   DOB: Dec 27, 1933, 81 y.o.   MRN: [id number]    Subjective:  HPI  Hypertension, follow-up:  BP Readings from Last 3 Encounters:  12/20/14 132/62  10/07/14 170/60    She was last seen for hypertension 2 months ago.  BP at that visit was 170/60. Management changes since that visit include Started Amlodipine. She reports good compliance with treatment. She is not having side effects.  She is not exercising. Outside blood pressures are 130's/80's. She is experiencing none.    Wt Readings from Last 3 Encounters:  No data found for Wt   ------------------------------------------------------------------------ Labs ordered at [place], not done.    Prior to Admission medications   Medication Sig Start Date End Date Taking? Authorizing Provider  amLODipine (NORVASC) 5 MG tablet Take by mouth. 10/07/14  Yes Historical Provider, MD  Cetirizine HCl (ZYRTEC ALLERGY) 10 MG CAPS Take by mouth. 12/16/13  Yes Historical Provider, MD  gabapentin (NEURONTIN) 300 MG capsule Take by mouth. 08/17/14  Yes Historical Provider, MD  glipiZIDE (GLUCOTROL) 10 MG tablet Take by mouth. 08/17/14  Yes Historical Provider, MD  hydrochlorothiazide (HYDRODIURIL) 25 MG tablet Take by mouth. 08/17/14  Yes Historical Provider, MD  losartan (COZAAR) 100 MG tablet Take by mouth. 08/17/14  Yes Historical Provider, MD  metFORMIN (GLUCOPHAGE) 500 MG tablet Take by mouth. 08/17/14  Yes Historical Provider, MD  metoprolol (LOPRESSOR) 50 MG tablet Take by mouth. 08/17/14  Yes Historical Provider, MD  mirtazapine (REMERON) 30 MG tablet Take by mouth. 08/17/14  Yes Historical Provider, MD  warfarin (COUMADIN) 4 MG tablet Take by mouth. 08/17/14  Yes Historical Provider, MD  warfarin (COUMADIN) 5 MG tablet Take by mouth. 01/07/14  Yes Historical Provider, MD    Patient Active Problem List   Diagnosis Date Noted  . Allergic rhinitis 11/16/2014  . Cannot sleep 11/16/2014  . Peripheral blood vessel disorder  11/16/2014  . FLS (phantom limb syndrome) 11/16/2014  . Reactive depression (situational) 11/16/2014  . Amputation of lower limb 11/16/2014  . Diabetes mellitus, type 2 11/16/2014  . Amputation of leg, traumatic 06/18/2012  . Osteomyelitis of ankle and foot 02/20/2012  . Gangrene of foot 02/20/2012  . DM (diabetes mellitus), secondary 07/06/2005  . HLD (hyperlipidemia) 07/06/2005    Past Medical History  Diagnosis Date  . Hyperlipidemia 07/06/2005  . Hypertension   . Depression     History   Social History  . Marital Status: Married    Spouse Name: N/A  . Number of Children: N/A  . Years of Education: N/A   Occupational History  . Not on file.   Social History Main Topics  . Smoking status: Never Smoker   . Smokeless tobacco: Not on file  . Alcohol Use: No  . Drug Use: No  . Sexual Activity: Not on file   Other Topics Concern  . Not on file   Social History Narrative    Allergies  Allergen Reactions  . Amoxicillin Other (See Comments)  . Azithromycin     vomiting and stomach upset    Review of Systems  All other systems reviewed and are negative.    There is no immunization history on file for this patient. Objective:  BP 132/62 mmHg  Pulse 84  Temp(Src) 97.9 F (36.6 C) (Oral)  Resp 16  Physical Exam  Constitutional: She is oriented to person, place, and time and well-developed, well-nourished, and in no distress.  HENT:  Head: Normocephalic and atraumatic.  Right Ear: External ear normal.  Left Ear: External ear normal.  Nose: Nose normal.  Eyes: Conjunctivae are normal.  Neck: Neck supple.  Cardiovascular: Normal rate, regular rhythm and normal heart sounds.   Pulmonary/Chest: Effort normal and breath sounds normal.  Abdominal: Soft. Bowel sounds are normal.  Neurological: She is alert and oriented to person, place, and time. Gait normal.  Skin: Skin is warm and dry.  Psychiatric: Mood, memory, affect and judgment normal.    Lab  Results  Component Value Date   WBC 11.1* 12/25/2011   HGB 8.8* 12/25/2011   HCT 26.2* 12/25/2011   PLT 532* 12/25/2011   GLUCOSE 167* 12/22/2011   CHOL 273* 07/04/2005   TRIG 182* 07/04/2005   HDL 40 07/04/2005   LDLCALC 197 07/04/2005   TSH 1.62 07/04/2005   INR 3.5 12/25/2011   HGBA1C 7.1* 10/06/2013    CMP     Component Value Date/Time   NA 134* 12/22/2011 0507   NA 138 07/04/2005   K 3.6 12/22/2011 0507   K 4.5 07/04/2005   CL 99 12/22/2011 0507   CO2 26 12/22/2011 0507   GLUCOSE 167* 12/22/2011 0507   BUN 5* 12/22/2011 0507   BUN 9 07/04/2005   CREATININE 0.42* 12/22/2011 0507   CALCIUM 8.5 12/22/2011 0507   AST 15 07/04/2005   ALT 15 07/04/2005   ALKPHOS 84 07/04/2005   GFRNONAA >60 12/22/2011 0507   GFRAA >60 12/22/2011 0507    Assessment and Plan :  1. HLD (hyperlipidemia)  - COMPLETE METABOLIC PANEL WITH GFR - Lipid Panel With LDL/HDL Ratio  2. Essential hypertension Much improved on present therapy. - CBC with Differential/Platelet - TSH  3. DM (diabetes mellitus), secondary Will get A1c in 4 months since not been 3 months since the last one.   Patient was seen and examined by Dr. Miguel Aschoff, and noted scribed by Webb Laws, [place] [facility] 12/20/2014 2:36 PM

## 2014-12-21 LAB — COMPREHENSIVE METABOLIC PANEL
ALBUMIN: 3.9 g/dL (ref 3.5–4.8)
ALT: 18 IU/L (ref 0–32)
AST: 14 IU/L (ref 0–40)
Albumin/Globulin Ratio: 1.4 (ref 1.1–2.5)
Alkaline Phosphatase: 69 IU/L (ref 39–117)
BUN/Creatinine Ratio: 36 — ABNORMAL HIGH (ref 11–26)
BUN: 19 mg/dL (ref 8–27)
CHLORIDE: 88 mmol/L — AB (ref 97–108)
CO2: 25 mmol/L (ref 18–29)
CREATININE: 0.53 mg/dL — AB (ref 0.57–1.00)
Calcium: 9.4 mg/dL (ref 8.7–10.3)
GFR calc non Af Amer: 91 mL/min/{1.73_m2} (ref >59)
GFR, EST AFRICAN AMERICAN: 104 mL/min/{1.73_m2} (ref >59)
GLUCOSE: 142 mg/dL — AB (ref 65–99)
Globulin, Total: 2.7 g/dL (ref 1.5–4.5)
Potassium: 5.1 mmol/L (ref 3.5–5.2)
SODIUM: 128 mmol/L — AB (ref 134–144)
Total Protein: 6.6 g/dL (ref 6.0–8.5)

## 2014-12-21 LAB — CBC WITH DIFFERENTIAL/PLATELET
BASOS: 1 %
Basophils Absolute: 0 10*3/uL (ref 0.0–0.2)
EOS (ABSOLUTE): 0.2 10*3/uL (ref 0.0–0.4)
EOS: 2 %
Hematocrit: 29.7 % — ABNORMAL LOW (ref 34.0–46.6)
Hemoglobin: 10.3 g/dL — ABNORMAL LOW (ref 11.1–15.9)
IMMATURE GRANS (ABS): 0 10*3/uL (ref 0.0–0.1)
Immature Granulocytes: 0 %
LYMPHS: 35 %
Lymphocytes Absolute: 3.1 10*3/uL (ref 0.7–3.1)
MCH: 27.9 pg (ref 26.6–33.0)
MCHC: 34.7 g/dL (ref 31.5–35.7)
MCV: 81 fL (ref 79–97)
MONOCYTES: 9 %
Monocytes Absolute: 0.8 10*3/uL (ref 0.1–0.9)
Neutrophils Absolute: 4.7 10*3/uL (ref 1.4–7.0)
Neutrophils: 53 %
Platelets: 409 10*3/uL — ABNORMAL HIGH (ref 150–379)
RBC: 3.69 x10E6/uL — ABNORMAL LOW (ref 3.77–5.28)
RDW: 14.7 % (ref 12.3–15.4)
WBC: 8.8 10*3/uL (ref 3.4–10.8)

## 2014-12-21 LAB — LIPID PANEL WITH LDL/HDL RATIO
CHOLESTEROL TOTAL: 262 mg/dL — AB (ref 100–199)
HDL: 36 mg/dL — ABNORMAL LOW (ref >39)
LDL Calculated: 164 mg/dL — ABNORMAL HIGH (ref 0–99)
LDl/HDL Ratio: 4.6 ratio units — ABNORMAL HIGH (ref 0.0–3.2)
TRIGLYCERIDES: 309 mg/dL — AB (ref 0–149)
VLDL Cholesterol Cal: 62 mg/dL — ABNORMAL HIGH (ref 5–40)

## 2014-12-21 LAB — TSH: TSH: 3.23 u[IU]/mL (ref 0.450–4.500)

## 2014-12-22 ENCOUNTER — Other Ambulatory Visit: Payer: Self-pay | Admitting: Emergency Medicine

## 2014-12-22 DIAGNOSIS — E785 Hyperlipidemia, unspecified: Secondary | ICD-10-CM

## 2014-12-22 MED ORDER — SIMVASTATIN 20 MG PO TABS: 20.0000 mg | ORAL_TABLET | Freq: Every day | ORAL | Status: DC

## 2015-02-24 ENCOUNTER — Other Ambulatory Visit: Payer: Self-pay

## 2015-02-24 MED ORDER — HYDROCHLOROTHIAZIDE 25 MG PO TABS: 25.0000 mg | ORAL_TABLET | Freq: Every day | ORAL | Status: DC

## 2015-04-04 ENCOUNTER — Other Ambulatory Visit: Payer: Self-pay | Admitting: Family Medicine

## 2015-04-06 ENCOUNTER — Other Ambulatory Visit: Payer: Self-pay | Admitting: Family Medicine

## 2015-04-19 ENCOUNTER — Ambulatory Visit: Payer: Medicare Other | Admitting: Family Medicine

## 2015-06-06 ENCOUNTER — Other Ambulatory Visit: Payer: Self-pay | Admitting: Family Medicine

## 2015-06-06 DIAGNOSIS — E119 Type 2 diabetes mellitus without complications: Secondary | ICD-10-CM

## 2015-06-30 ENCOUNTER — Other Ambulatory Visit: Payer: Self-pay | Admitting: Family Medicine

## 2015-07-11 ENCOUNTER — Other Ambulatory Visit: Payer: Self-pay | Admitting: Family Medicine

## 2015-07-28 ENCOUNTER — Other Ambulatory Visit: Payer: Self-pay | Admitting: Family Medicine

## 2015-09-05 ENCOUNTER — Other Ambulatory Visit: Payer: Self-pay | Admitting: Family Medicine

## 2015-09-26 ENCOUNTER — Other Ambulatory Visit: Payer: Self-pay | Admitting: Family Medicine

## 2015-10-17 ENCOUNTER — Other Ambulatory Visit: Payer: Self-pay | Admitting: Family Medicine

## 2015-10-24 ENCOUNTER — Other Ambulatory Visit: Payer: Self-pay | Admitting: Family Medicine

## 2015-11-17 ENCOUNTER — Other Ambulatory Visit: Payer: Self-pay | Admitting: Family Medicine

## 2015-11-21 ENCOUNTER — Other Ambulatory Visit: Payer: Self-pay | Admitting: Family Medicine

## 2015-11-22 ENCOUNTER — Other Ambulatory Visit: Payer: Self-pay | Admitting: Family Medicine

## 2015-11-23 ENCOUNTER — Other Ambulatory Visit: Payer: Self-pay | Admitting: Family Medicine

## 2015-12-01 ENCOUNTER — Other Ambulatory Visit: Payer: Self-pay | Admitting: Family Medicine

## 2015-12-09 ENCOUNTER — Other Ambulatory Visit: Payer: Self-pay | Admitting: Family Medicine

## 2015-12-09 DIAGNOSIS — E785 Hyperlipidemia, unspecified: Secondary | ICD-10-CM

## 2015-12-22 ENCOUNTER — Other Ambulatory Visit: Payer: Self-pay | Admitting: Family Medicine

## 2016-01-15 ENCOUNTER — Other Ambulatory Visit: Payer: Self-pay | Admitting: Family Medicine

## 2016-01-18 ENCOUNTER — Other Ambulatory Visit: Payer: Self-pay | Admitting: Family Medicine

## 2016-01-23 ENCOUNTER — Other Ambulatory Visit: Payer: Self-pay | Admitting: Family Medicine

## 2016-01-24 ENCOUNTER — Other Ambulatory Visit: Payer: Self-pay | Admitting: Family Medicine

## 2016-01-24 DIAGNOSIS — I10 Essential (primary) hypertension: Secondary | ICD-10-CM

## 2016-02-07 ENCOUNTER — Other Ambulatory Visit: Payer: Self-pay | Admitting: Family Medicine

## 2016-02-09 ENCOUNTER — Telehealth: Payer: Self-pay | Admitting: Family Medicine

## 2016-02-09 NOTE — Telephone Encounter (Signed)
Pt contacted office for refill request on the following medications: 1. glipiZIDE (GLUCOTROL) 10 MG tablet Last written: 11/17/15 No refills 2. metoprolol (LOPRESSOR) 50 MG tablet Last written: 12/22/15 1 refill Last OV: 12/20/14 Pt is scheduled for OV with husband on 02/28/16. To [facility] Please advise. Thanks TNP

## 2016-02-09 NOTE — Telephone Encounter (Signed)
Ok to refill. Thanks 

## 2016-02-10 ENCOUNTER — Other Ambulatory Visit: Payer: Self-pay | Admitting: Family Medicine

## 2016-02-10 MED ORDER — GLIPIZIDE 10 MG PO TABS: 10.0000 mg | ORAL_TABLET | Freq: Every day | ORAL | 0 refills | Status: DC

## 2016-02-10 MED ORDER — METOPROLOL TARTRATE 50 MG PO TABS: 50.0000 mg | ORAL_TABLET | Freq: Two times a day (BID) | ORAL | 0 refills | Status: DC

## 2016-02-10 NOTE — Telephone Encounter (Signed)
Sent in. Melanie Diaz, [place]

## 2016-02-10 NOTE — Telephone Encounter (Signed)
2 weeks only==pt has not kept appts as recommended

## 2016-02-13 ENCOUNTER — Other Ambulatory Visit: Payer: Self-pay | Admitting: Family Medicine

## 2016-02-21 ENCOUNTER — Other Ambulatory Visit: Payer: Self-pay | Admitting: Family Medicine

## 2016-02-24 ENCOUNTER — Other Ambulatory Visit: Payer: Self-pay | Admitting: Family Medicine

## 2016-02-27 ENCOUNTER — Other Ambulatory Visit: Payer: Self-pay | Admitting: Family Medicine

## 2016-02-27 ENCOUNTER — Other Ambulatory Visit: Payer: Self-pay

## 2016-02-27 MED ORDER — AMLODIPINE BESYLATE 5 MG PO TABS: ORAL_TABLET | ORAL | 0 refills | Status: DC

## 2016-02-27 MED ORDER — METOPROLOL TARTRATE 50 MG PO TABS: 50.0000 mg | ORAL_TABLET | Freq: Two times a day (BID) | ORAL | 0 refills | Status: DC

## 2016-02-27 NOTE — Telephone Encounter (Signed)
RX sent in-aa

## 2016-02-27 NOTE — Telephone Encounter (Signed)
2 week refill only

## 2016-02-27 NOTE — Telephone Encounter (Signed)
Pt needs refill on her metoprolol 50 mg  She uses [facility]   Thank sTeri

## 2016-02-27 NOTE — Telephone Encounter (Signed)
Please review, next appt 03/08/16-aa

## 2016-02-28 ENCOUNTER — Ambulatory Visit: Payer: Medicare Other | Admitting: Family Medicine

## 2016-03-05 ENCOUNTER — Other Ambulatory Visit: Payer: Self-pay | Admitting: Family Medicine

## 2016-03-08 ENCOUNTER — Ambulatory Visit: Payer: Medicare Other | Admitting: Family Medicine

## 2016-03-12 ENCOUNTER — Other Ambulatory Visit: Payer: Self-pay | Admitting: Family Medicine

## 2016-03-19 ENCOUNTER — Other Ambulatory Visit: Payer: Self-pay | Admitting: Family Medicine

## 2016-03-19 ENCOUNTER — Encounter: Payer: Self-pay | Admitting: Family Medicine

## 2016-03-19 ENCOUNTER — Ambulatory Visit (INDEPENDENT_AMBULATORY_CARE_PROVIDER_SITE_OTHER): Payer: Medicare Other | Admitting: Family Medicine

## 2016-03-19 VITALS — BP 146/62 | HR 54 | Temp 98.4°F | Resp 12 | Wt 137.0 lb

## 2016-03-19 DIAGNOSIS — G4709 Other insomnia: Secondary | ICD-10-CM | POA: Diagnosis not present

## 2016-03-19 DIAGNOSIS — E784 Other hyperlipidemia: Secondary | ICD-10-CM | POA: Diagnosis not present

## 2016-03-19 DIAGNOSIS — F329 Major depressive disorder, single episode, unspecified: Secondary | ICD-10-CM | POA: Diagnosis not present

## 2016-03-19 DIAGNOSIS — I10 Essential (primary) hypertension: Secondary | ICD-10-CM

## 2016-03-19 DIAGNOSIS — E139 Other specified diabetes mellitus without complications: Secondary | ICD-10-CM

## 2016-03-19 DIAGNOSIS — E7849 Other hyperlipidemia: Secondary | ICD-10-CM

## 2016-03-19 DIAGNOSIS — G547 Phantom limb syndrome without pain: Secondary | ICD-10-CM | POA: Diagnosis not present

## 2016-03-19 DIAGNOSIS — S88919A Complete traumatic amputation of unspecified lower leg, level unspecified, initial encounter: Secondary | ICD-10-CM

## 2016-03-19 DIAGNOSIS — Z89619 Acquired absence of unspecified leg above knee: Secondary | ICD-10-CM

## 2016-03-19 LAB — POCT GLYCOSYLATED HEMOGLOBIN (HGB A1C): HEMOGLOBIN A1C: 7.6

## 2016-03-19 MED ORDER — GLIPIZIDE 10 MG PO TABS: 10.0000 mg | ORAL_TABLET | Freq: Every day | ORAL | 1 refills | Status: DC

## 2016-03-19 MED ORDER — MIRTAZAPINE 30 MG PO TABS: 30.0000 mg | ORAL_TABLET | Freq: Every day | ORAL | 1 refills | Status: DC

## 2016-03-19 MED ORDER — LOSARTAN POTASSIUM 100 MG PO TABS: 100.0000 mg | ORAL_TABLET | Freq: Every day | ORAL | 1 refills | Status: DC

## 2016-03-19 MED ORDER — METFORMIN HCL 500 MG PO TABS: ORAL_TABLET | ORAL | 1 refills | Status: DC

## 2016-03-19 MED ORDER — METOPROLOL TARTRATE 50 MG PO TABS: 50.0000 mg | ORAL_TABLET | Freq: Two times a day (BID) | ORAL | 1 refills | Status: DC

## 2016-03-19 MED ORDER — HYDROCHLOROTHIAZIDE 25 MG PO TABS: 25.0000 mg | ORAL_TABLET | Freq: Every day | ORAL | 1 refills | Status: DC

## 2016-03-19 MED ORDER — AMLODIPINE BESYLATE 5 MG PO TABS: ORAL_TABLET | ORAL | 1 refills | Status: DC

## 2016-03-19 MED ORDER — GABAPENTIN 300 MG PO CAPS: ORAL_CAPSULE | ORAL | 1 refills | Status: DC

## 2016-03-19 NOTE — Progress Notes (Addendum)
Melanie Diaz  MRN: [id number] DOB: 06-02-1933  Subjective:  HPI  Patient is here for follow up Diabetes: patient checks her sugar usually once daily and sometimes twice if she does not feel well. Last fasting reading was 128.  Last A1C was on 09/01/14 7.3 Patient is completely noncompliant with office follow-up. Hypertension: patient checks her b/p sometimes and last reading was 146/68. No cardiac symptoms. BP Readings from Last 3 Encounters:  03/19/16 (!) 146/62  12/20/14 132/62  10/07/14 (!) 170/60    Last labs were in October 2016, patient does not take Simvastatin daily due to it upsets her stomach. Patient Active Problem List   Diagnosis Date Noted  . Hypertension 12/20/2014  . Allergic rhinitis 11/16/2014  . Cannot sleep 11/16/2014  . Peripheral blood vessel disorder ([place]) 11/16/2014  . FLS (phantom limb syndrome) 11/16/2014  . Reactive depression (situational) 11/16/2014  . Amputation of lower limb ([place]) 11/16/2014  . Diabetes mellitus, type 2 ([place]) 11/16/2014  . Amputation of leg, traumatic ([place]) 06/18/2012  . Osteomyelitis of ankle and foot ([place]) 02/20/2012  . Gangrene of foot ([place]) 02/20/2012  . DM (diabetes mellitus), secondary ([place]) 07/06/2005  . HLD (hyperlipidemia) 07/06/2005    Past Medical History:  Diagnosis Date  . Depression   . Hyperlipidemia 07/06/2005  . Hypertension     Social History   Social History  . Marital status: Married    Spouse name: N/A  . Number of children: N/A  . Years of education: N/A   Occupational History  . Not on file.   Social History Main Topics  . Smoking status: Never Smoker  . Smokeless tobacco: Not on file  . Alcohol use No  . Drug use: No  . Sexual activity: Not on file   Other Topics Concern  . Not on file   Social History Narrative  . No narrative on file    Outpatient Encounter Prescriptions as of 03/19/2016  Medication Sig Note  . amLODipine (NORVASC) 5 MG tablet 1 (one) Tablet, Oral, daily    . Cetirizine HCl (ZYRTEC ALLERGY) 10 MG CAPS Take by mouth. 11/16/2014: Received from: [facility]  . gabapentin (NEURONTIN) 300 MG capsule TAKE (1) CAPSULE TWICE DAILY AS NEEDED.   Marland Kitchen glipiZIDE (GLUCOTROL) 10 MG tablet Take 1 tablet (10 mg total) by mouth daily.   . hydrochlorothiazide (HYDRODIURIL) 25 MG tablet Take 1 tablet (25 mg total) by mouth daily.   Marland Kitchen losartan (COZAAR) 100 MG tablet TAKE 1 TABLET DAILY.   . metFORMIN (GLUCOPHAGE) 500 MG tablet TAKE THREE TABLETS AT BEDTIME.   . metoprolol (LOPRESSOR) 50 MG tablet Take 1 tablet (50 mg total) by mouth 2 (two) times daily.   . mirtazapine (REMERON) 30 MG tablet TAKE TWO TABLETS AT BEDTIME.   . simvastatin (ZOCOR) 20 MG tablet Take 1 tablet (20 mg total) by mouth daily.   Marland Kitchen warfarin (COUMADIN) 4 MG tablet TAKE ONE TABLET DAILY AS DIRECTED.   Marland Kitchen warfarin (COUMADIN) 5 MG tablet Take by mouth. 11/16/2014: Received from: [facility]   No facility-administered encounter medications on file as of 03/19/2016.     Allergies  Allergen Reactions  . Amoxicillin Other (See Comments)  . Azithromycin     vomiting and stomach upset    Review of Systems  Constitutional: Negative.   HENT: Negative.   Eyes: Negative.   Respiratory: Negative.   Cardiovascular: Negative.   Gastrointestinal: Negative.   Genitourinary: Negative.   Musculoskeletal: Positive for myalgias.  Has phantom pain at times. Right leg amputated up to the almost knee area  Skin: Negative.   Endo/Heme/Allergies: Negative.   Psychiatric/Behavioral: The patient has insomnia (at times.).     Objective:  BP (!) 146/62   Pulse (!) 54   Temp 98.4 F (36.9 C)   Resp 12   Wt 137 lb (62.1 kg)   Physical Exam  Constitutional: She is oriented to person, place, and time and well-developed, well-nourished, and in no distress.  HENT:  Head: Normocephalic and atraumatic.  Right Ear: External ear normal.  Left Ear: External ear normal.  Nose:  Nose normal.  Eyes: Conjunctivae are normal. Pupils are equal, round, and reactive to light.  Neck: Normal range of motion. Neck supple.  Cardiovascular: Normal rate, regular rhythm, normal heart sounds and intact distal pulses.   No murmur heard. Pulmonary/Chest: Effort normal and breath sounds normal. No respiratory distress. She has no wheezes.  Neurological: She is alert and oriented to person, place, and time. No cranial nerve deficit. She exhibits normal muscle tone.  Skin: Skin is warm and dry.  Psychiatric: Mood, memory, affect and judgment normal.    Assessment and Plan :  1. DM (diabetes mellitus), secondary (HCC) A1C 7.6. Stable. Follow. No changes today. - POCT HgB A1C  2. Essential hypertension Stable for now. - CBC w/Diff/Platelet - Comprehensive metabolic panel  3. Other hyperlipidemia Stop Simvastatin since patient is having stomach issues. Will re check lipids on the next visit.  4. Phantom limb syndrome (HCC) Presently stable. 5. Other insomnia - TSH  6. Amputation of lower limb ([place]) Secondary to osteomyelitis for diabetic infection.  Status post right BKA 7. Reactive depression (situational) - TSH Daughter has metastatic breast cancer and patient is understandably upset about this. 8. Noncompliance with follow-up We'll give patient 6 months of refills. 9. History of DVTs times 3 Coumadin is listed on her medications and I'm not sure she is taking it. Also, she does not get PT is here and I cannot find any INRs in the chart. We'll try to figure out the situation.Will only refill Coumadin for 1 month and her time until she proves that she will keep follow-up. HPI, Exam and A&P transcribed under direction and in the presence of Miguel Aschoff, MD. I have done the exam and reviewed the chart and it is accurate to the best of my knowledge. Miguel Aschoff M.D. [facility]

## 2016-03-20 LAB — COMPREHENSIVE METABOLIC PANEL
ALBUMIN: 3.3 g/dL — AB (ref 3.5–4.7)
ALK PHOS: 94 IU/L (ref 39–117)
ALT: 17 IU/L (ref 0–32)
AST: 16 IU/L (ref 0–40)
Albumin/Globulin Ratio: 1.2 (ref 1.2–2.2)
BUN / CREAT RATIO: 39 — AB (ref 12–28)
BUN: 34 mg/dL — AB (ref 8–27)
Bilirubin Total: <0.2 mg/dL (ref 0.0–1.2)
CALCIUM: 8.9 mg/dL (ref 8.7–10.3)
CO2: 21 mmol/L (ref 18–29)
CREATININE: 0.87 mg/dL (ref 0.57–1.00)
Chloride: 95 mmol/L — ABNORMAL LOW (ref 96–106)
GFR, EST AFRICAN AMERICAN: 73 mL/min/{1.73_m2} (ref >59)
GFR, EST NON AFRICAN AMERICAN: 63 mL/min/{1.73_m2} (ref >59)
GLOBULIN, TOTAL: 2.8 g/dL (ref 1.5–4.5)
Glucose: 125 mg/dL — ABNORMAL HIGH (ref 65–99)
Potassium: 4.8 mmol/L (ref 3.5–5.2)
SODIUM: 133 mmol/L — AB (ref 134–144)
TOTAL PROTEIN: 6.1 g/dL (ref 6.0–8.5)

## 2016-03-20 LAB — CBC WITH DIFFERENTIAL/PLATELET
Basophils Absolute: 0 10*3/uL (ref 0.0–0.2)
Basos: 0 %
EOS (ABSOLUTE): 0.2 10*3/uL (ref 0.0–0.4)
EOS: 2 %
HEMATOCRIT: 27.5 % — AB (ref 34.0–46.6)
HEMOGLOBIN: 9.2 g/dL — AB (ref 11.1–15.9)
IMMATURE GRANS (ABS): 0 10*3/uL (ref 0.0–0.1)
Immature Granulocytes: 0 %
LYMPHS ABS: 3.2 10*3/uL — AB (ref 0.7–3.1)
LYMPHS: 30 %
MCH: 26.9 pg (ref 26.6–33.0)
MCHC: 33.5 g/dL (ref 31.5–35.7)
MCV: 80 fL (ref 79–97)
MONOCYTES: 10 %
Monocytes Absolute: 1 10*3/uL — ABNORMAL HIGH (ref 0.1–0.9)
Neutrophils Absolute: 6.1 10*3/uL (ref 1.4–7.0)
Neutrophils: 58 %
Platelets: 421 10*3/uL — ABNORMAL HIGH (ref 150–379)
RBC: 3.42 x10E6/uL — AB (ref 3.77–5.28)
RDW: 14.4 % (ref 12.3–15.4)
WBC: 10.6 10*3/uL (ref 3.4–10.8)

## 2016-03-20 LAB — TSH: TSH: 3.07 u[IU]/mL (ref 0.450–4.500)

## 2016-03-22 ENCOUNTER — Telehealth: Payer: Self-pay

## 2016-03-22 NOTE — Telephone Encounter (Signed)
Pt advised.   Thanks,   -Laura  

## 2016-03-22 NOTE — Telephone Encounter (Signed)
-----   Message from [name]., MD sent at 03/22/2016  8:17 AM EST ----- Labs stable. Patient slightly anemic. On next visit check CBC iron and A1c

## 2016-08-07 ENCOUNTER — Other Ambulatory Visit: Payer: Self-pay

## 2016-08-07 ENCOUNTER — Other Ambulatory Visit: Payer: Self-pay | Admitting: Family Medicine

## 2016-09-10 ENCOUNTER — Other Ambulatory Visit: Payer: Self-pay | Admitting: Family Medicine

## 2016-09-18 ENCOUNTER — Ambulatory Visit: Payer: Medicare Other | Admitting: Family Medicine

## 2016-09-20 ENCOUNTER — Other Ambulatory Visit: Payer: Self-pay | Admitting: Family Medicine

## 2016-11-19 ENCOUNTER — Ambulatory Visit (INDEPENDENT_AMBULATORY_CARE_PROVIDER_SITE_OTHER): Payer: Medicare Other | Admitting: Physician Assistant

## 2016-11-19 ENCOUNTER — Ambulatory Visit
Admission: RE | Admit: 2016-11-19 | Discharge: 2016-11-19 | Disposition: A | Discharge status: Home / Self Care | Payer: Medicare Other | Source: Ambulatory Visit | Attending: Physician Assistant | Admitting: Physician Assistant

## 2016-11-19 VITALS — BP 140/70 | HR 64 | Temp 98.3°F | Resp 16

## 2016-11-19 DIAGNOSIS — I739 Peripheral vascular disease, unspecified: Secondary | ICD-10-CM

## 2016-11-19 DIAGNOSIS — E119 Type 2 diabetes mellitus without complications: Secondary | ICD-10-CM | POA: Diagnosis not present

## 2016-11-19 DIAGNOSIS — M79675 Pain in left toe(s): Secondary | ICD-10-CM | POA: Diagnosis not present

## 2016-11-19 DIAGNOSIS — L089 Local infection of the skin and subcutaneous tissue, unspecified: Secondary | ICD-10-CM | POA: Insufficient documentation

## 2016-11-19 DIAGNOSIS — Z5181 Encounter for therapeutic drug level monitoring: Secondary | ICD-10-CM | POA: Diagnosis not present

## 2016-11-19 DIAGNOSIS — Z7901 Long term (current) use of anticoagulants: Secondary | ICD-10-CM | POA: Diagnosis not present

## 2016-11-19 DIAGNOSIS — R110 Nausea: Secondary | ICD-10-CM | POA: Diagnosis not present

## 2016-11-19 LAB — POCT INR
INR: 5.7
PT: 68.7

## 2016-11-19 LAB — POCT GLYCOSYLATED HEMOGLOBIN (HGB A1C)
Est. average glucose Bld gHb Est-mCnc: 157
Hemoglobin A1C: 7.1

## 2016-11-19 MED ORDER — DOXYCYCLINE HYCLATE 100 MG PO TABS: 100.0000 mg | ORAL_TABLET | Freq: Two times a day (BID) | ORAL | 0 refills | Status: DC

## 2016-11-19 MED ORDER — ONDANSETRON HCL 4 MG PO TABS: 4.0000 mg | ORAL_TABLET | Freq: Three times a day (TID) | ORAL | 0 refills | Status: DC | PRN

## 2016-11-19 MED ORDER — CLINDAMYCIN HCL 300 MG PO CAPS: 600.0000 mg | ORAL_CAPSULE | Freq: Three times a day (TID) | ORAL | 0 refills | Status: DC

## 2016-11-19 NOTE — Progress Notes (Addendum)
Patient: Melanie Diaz Female    DOB: [birth year]   83 y.o.   MRN: [id number] Visit Date: 11/19/2016  Today's Provider: Trinna Post, PA-C   Chief Complaint  Patient presents with  . Toe Pain   Subjective:      Melanie Diaz is an 83 y/o woman with Type II DM, PVD, history of BKA right lower limb 2/2 gangrene and osteomyelitis presenting today with toe pain. Her left fourth toe is the source of pain. She states last week she pulled her toenail off. Now it is green and draining. No fevers, chills, nausea, vomiting.   Toe Pain   The incident occurred more than 1 week ago. The incident occurred at home. The pain is present in the left toes. The quality of the pain is described as aching ("like something is stuck in it"). The patient is experiencing no pain. Pertinent negatives include no loss of sensation, numbness or tingling. She reports no foreign bodies present. The symptoms are aggravated by movement (sometimes). Treatments tried: sugar water and honey-home remedy. The treatment provided mild relief.        Allergies  Allergen Reactions  . Amoxicillin Other (See Comments)  . Azithromycin     vomiting and stomach upset     Current Outpatient Prescriptions:  .  amLODipine (NORVASC) 5 MG tablet, 1 (one) Tablet, Oral, daily, Disp: 90 tablet, Rfl: 1 .  Cetirizine HCl (ZYRTEC ALLERGY) 10 MG CAPS, Take by mouth., Disp: , Rfl:  .  gabapentin (NEURONTIN) 300 MG capsule, TAKE (1) CAPSULE TWICE DAILY AS NEEDED., Disp: 180 capsule, Rfl: 0 .  glipiZIDE (GLUCOTROL) 10 MG tablet, Take 1 tablet (10 mg total) by mouth daily., Disp: 90 tablet, Rfl: 0 .  hydrochlorothiazide (HYDRODIURIL) 25 MG tablet, Take 1 tablet (25 mg total) by mouth daily., Disp: 90 tablet, Rfl: 1 .  losartan (COZAAR) 100 MG tablet, Take 1 tablet (100 mg total) by mouth daily., Disp: 90 tablet, Rfl: 0 .  metFORMIN (GLUCOPHAGE) 500 MG tablet, 2 tablets daily, Disp: 180 tablet, Rfl: 1 .  metoprolol (LOPRESSOR) 50  MG tablet, Take 1 tablet (50 mg total) by mouth 2 (two) times daily., Disp: 180 tablet, Rfl: 1 .  mirtazapine (REMERON) 30 MG tablet, Take 1 tablet (30 mg total) by mouth at bedtime., Disp: 90 tablet, Rfl: 0 .  warfarin (COUMADIN) 4 MG tablet, TAKE ONE TABLET DAILY AS DIRECTED., Disp: 90 tablet, Rfl: 0 .  warfarin (COUMADIN) 5 MG tablet, Take by mouth., Disp: , Rfl:   Review of Systems  Cardiovascular: Positive for leg swelling. Negative for chest pain and palpitations.  Neurological: Negative for tingling and numbness.    Social History  Substance Use Topics  . Smoking status: Never Smoker  . Smokeless tobacco: Not on file  . Alcohol use No   Objective:   BP 140/70 (BP Location: Left Arm, Patient Position: Sitting, Cuff Size: Normal)   Pulse 64   Temp 98.3 F (36.8 C) (Oral)   Resp 16   SpO2 98%     Physical Exam  Constitutional: She is oriented to person, place, and time. She appears well-developed and well-nourished.  Afebrile, nontoxic.  Cardiovascular: Normal rate.   Pulses:      Dorsalis pedis pulses are 1+ on the left side.       Posterior tibial pulses are 1+ on the left side.  Cap Refill > 3s on left leg  Pulmonary/Chest: Effort normal.  Musculoskeletal: She exhibits  deformity.  S/p BKA of right leg. Patient has left fourth toe infection. The most distal phalanx of the left fourth toe seems to have been effaced. On the distal aspect of this digit, there is a depression with a collection of purulence. No nail visible.   Neurological: She is alert and oriented to person, place, and time.  Skin: Skin is dry. There is erythema.  Psychiatric: She has a normal mood and affect. Her behavior is normal.    Media Information     Document Information   Photos  Left foot infection   11/19/2016 2:00 PM  Attached To:  Office Visit on 11/19/16 with Trinna Post, PA-C  Source Information   Paulene Floor  [place]:       1. Toe infection  I have instructed patient that her toe appears infected. I have counseled her that she will need Clindamycin 600 mg three times daily for ten days. We can not do doxycycline like we originally talked about because her INR at 5.7 is too high. She also needs a foot xray at the [facility] on [address] to assess for osteomyelitis. She should get this today. Additionally, we have referred her to podiatry for possible debridement. Originally, we had appointment scheduled for tomorrow with Dr. Vickki Muff at [facility], but patient does not want to see this provider. She has an appt on 11/22/2016 with Dr. Prudence Davidson at [facility]. I have emphasized the importance of this treatment. She says all antibiotics make her throw up and she can't take them. I have given her Zofran which she is to take before to help with nausea. She says if it makes her vomit she can't take it. I have again emphasized the importance of taking this medication as there is a possibility she could lose her foot from spreading infection.    - DG Foot Complete Left; Future - Ambulatory referral to Podiatry - clindamycin (CLEOCIN) 300 MG capsule; Take 2 capsules (600 mg total) by mouth 3 (three) times daily.  Dispense: 60 capsule; Refill: 0  2. Anticoagulation management encounter  Her INR is 5.7 today. We are holding her Warfarin until the next time she sees [place]. Best from what I can tell, she is on 4 mg daily. She has not had INR checked in a while, says she was not told to come back monthly. I have explicitly instructed patient that she must come back monthly to have INR monitored when she is on Warfarin. If she cannot commit to doing this, we cannot refill her medication. We have gone over complications from unmonitored anticoagulation, mainly death by internal bleeding.   - POCT INR  3. Type 2 diabetes mellitus without complication, without long-term current use of insulin (HCC)  7.1% today. Needs to follow up maintenance,  urine microalbumin. Continue current diabetic medications.  - POCT glycosylated hemoglobin (Hb A1C)  4. Nausea  Will give zofran for her to take prior to antibiotic use.  - ondansetron (ZOFRAN) 4 MG tablet; Take 1 tablet (4 mg total) by mouth every 8 (eight) hours as needed for nausea or vomiting.  Dispense: 20 tablet; Refill: 0  5. Peripheral blood vessel disorder (HCC)  Present, now with new foot infection.   Return in about 4 days (around 11/23/2016) for INR and f/u Dr. Darnell Level.  The entirety of the information documented in the History of Present Illness, Review of Systems and Physical Exam were personally obtained  by me. Portions of this information were initially documented by [facility], CMA and reviewed by me for thoroughness and accuracy.         Trinna Post, PA-C  [facility]

## 2016-11-19 NOTE — Patient Instructions (Addendum)
Please take Clindamycin 600 mg three times daily for ten days. I have given you zofran for nausea every 8 hours as needed.  We got an INR on you to check how thin your blood is today. You need a repeat of this blood level in one week. You will also need an xray, you will get this across the street at the [facility] center. We'll call you about the results. Hold Warfarin until we see you back Follow up with Dr. Darnell Level later this week.   Bone and Joint Infections, Adult Bone infections (osteomyelitis) and joint infections (septic arthritis) occur when bacteria or other germs get inside a bone or a joint. This can happen if you have an infection in another part of your body that spreads through your blood. Germs from your skin or from outside of your body can also cause this type of infection if you have a wound or a broken bone (fracture) that breaks the skin. Anyone can get a bone infection or joint infection. You may be more likely to get this type of infection if you have a condition, such as diabetes, that lowers your ability to fight infection or increases your chances of getting an infection. Bone and joint infections can cause damage, and they can spread to other areas of your body. They need to be treated quickly. What are the causes? Most bone and joint infections are caused by bacteria. They can also be caused by other germs, such as viruses and funguses. What increases the risk? This condition is more likely to develop in:  People who recently had surgery, especially bone or joint surgery.  People who have a long-term (chronic) disease, such as: ? HIV (human immunodeficiency virus). ? Diabetes. ? Rheumatoid arthritis. ? Sickle cell anemia.  Elderly people.  People who take medicines that block or weaken the body's defense system (immune system).  People who have a condition that reduces their blood flow.  People who are on kidney dialysis.  People who have an artificial  joint.  People who have had a joint or bone repaired with plates or screws (surgical hardware).  People who use or abuse IV drugs.  People who have had trauma, such as stepping on a nail.  What are the signs or symptoms? Symptoms vary depending on the type and location of your infection. Common symptoms of bone and joint infections include:  Fever and chills.  Redness and warmth.  Swelling.  Pain and stiffness.  Drainage of fluid or pus near the infection.  Weight loss and fatigue.  Decreased ability to use a hand or foot.  How is this diagnosed? This condition may be diagnosed based on symptoms, medical history, a physical exam, and diagnostic tests. Tests can help to identify the cause of the infection. You may have various tests, such as:  A sample of tissue, fluid, or blood taken to be examined under a microscope.  A procedure to remove fluid from the infected joint with a needle (joint aspiration) for testing in a lab.  Pus or discharge swabbed from a wound for testing to identify germs and to determine what type of medicine will kill them (culture and sensitivity).  Blood tests to look for evidence of infection and inflammation (biomarkers).  Imaging studies to determine how severe the bone or joint infection is. These may include: ? X-rays. ? CT scan. ? MRI. ? Bone scan.  How is this treated? Treatment depends on the cause and type of infection. Antibiotic  medicines are usually the first treatment for a bone or joint infection. Treatment with antibiotics may include:  Getting IV antibiotics. This may be done in a hospital at first. You may have to continue IV antibiotics at home for several weeks. You may also have to take antibiotics by mouth for several weeks after that.  Taking more than one kind of antibiotic. Treatment may start with a type of antibiotic that works against many different bacteria (broad spectrumantibiotics). IV antibiotics may be changed if  tests show that another type may work better.  Other treatments may include:  Draining fluid from the joint by placing a needle into it (aspiration).  Surgery to remove: ? Dead or dying tissue from a bone or joint. ? An infected artificial joint. ? Infected plates or screws that were used to repair a broken bone.  Follow these instructions at home:  Take medicines only as directed by your health care provider.  Take your antibiotic medicine as directed by your health care provider. Finish the antibiotic even if you start to feel better.  Follow instructions from your health care provider about how to take IV antibiotics at home.  Ask your health care provider if you have any restrictions on your activities.  Keep all follow-up visits as directed by your health care provider. This is important. Contact a health care provider if:  You have a fever or chills.  You have redness, warmth, pain, or swelling that returns after treatment. Get help right away if:  You have rapid breathing or you have trouble breathing.  You have chest pain.  You cannot drink fluids or make urine.  The affected arm or leg swells, changes color, or turns blue. This information is not intended to replace advice given to you by your health care provider. Make sure you discuss any questions you have with your health care provider. Document Released: 04/30/2005 Document Revised: 10/06/2015 Document Reviewed: 04/28/2014 Elsevier Interactive Patient Education  [employer].

## 2016-11-22 ENCOUNTER — Ambulatory Visit: Payer: Self-pay | Admitting: Physician Assistant

## 2016-11-22 ENCOUNTER — Encounter: Payer: Self-pay | Admitting: Podiatry

## 2016-11-22 ENCOUNTER — Ambulatory Visit (INDEPENDENT_AMBULATORY_CARE_PROVIDER_SITE_OTHER): Payer: Medicare Other | Admitting: Family Medicine

## 2016-11-22 ENCOUNTER — Ambulatory Visit (INDEPENDENT_AMBULATORY_CARE_PROVIDER_SITE_OTHER): Payer: Medicare Other | Admitting: Podiatry

## 2016-11-22 VITALS — BP 158/48 | HR 60 | Temp 98.6°F | Resp 16

## 2016-11-22 VITALS — BP 133/76 | HR 72

## 2016-11-22 DIAGNOSIS — I739 Peripheral vascular disease, unspecified: Secondary | ICD-10-CM

## 2016-11-22 DIAGNOSIS — G547 Phantom limb syndrome without pain: Secondary | ICD-10-CM

## 2016-11-22 DIAGNOSIS — E1142 Type 2 diabetes mellitus with diabetic polyneuropathy: Secondary | ICD-10-CM | POA: Diagnosis not present

## 2016-11-22 DIAGNOSIS — L089 Local infection of the skin and subcutaneous tissue, unspecified: Secondary | ICD-10-CM | POA: Diagnosis not present

## 2016-11-22 DIAGNOSIS — Z89619 Acquired absence of unspecified leg above knee: Secondary | ICD-10-CM

## 2016-11-22 DIAGNOSIS — Z86718 Personal history of other venous thrombosis and embolism: Secondary | ICD-10-CM | POA: Diagnosis not present

## 2016-11-22 DIAGNOSIS — Z5181 Encounter for therapeutic drug level monitoring: Secondary | ICD-10-CM | POA: Diagnosis not present

## 2016-11-22 DIAGNOSIS — Z7901 Long term (current) use of anticoagulants: Secondary | ICD-10-CM

## 2016-11-22 DIAGNOSIS — L97521 Non-pressure chronic ulcer of other part of left foot limited to breakdown of skin: Secondary | ICD-10-CM | POA: Diagnosis not present

## 2016-11-22 DIAGNOSIS — E11621 Type 2 diabetes mellitus with foot ulcer: Secondary | ICD-10-CM

## 2016-11-22 DIAGNOSIS — S88919A Complete traumatic amputation of unspecified lower leg, level unspecified, initial encounter: Secondary | ICD-10-CM

## 2016-11-22 DIAGNOSIS — L02612 Cutaneous abscess of left foot: Secondary | ICD-10-CM

## 2016-11-22 DIAGNOSIS — L03032 Cellulitis of left toe: Secondary | ICD-10-CM | POA: Diagnosis not present

## 2016-11-22 DIAGNOSIS — E1159 Type 2 diabetes mellitus with other circulatory complications: Secondary | ICD-10-CM | POA: Diagnosis not present

## 2016-11-22 DIAGNOSIS — I82409 Acute embolism and thrombosis of unspecified deep veins of unspecified lower extremity: Secondary | ICD-10-CM | POA: Insufficient documentation

## 2016-11-22 LAB — POCT INR
INR: 1.7
PT: 20

## 2016-11-22 MED ORDER — DOXYCYCLINE HYCLATE 100 MG PO TABS: 100.0000 mg | ORAL_TABLET | Freq: Two times a day (BID) | ORAL | 0 refills | Status: DC

## 2016-11-22 NOTE — Progress Notes (Signed)
Melanie Diaz  MRN: [id number] DOB: 08-02-1933  Subjective:  HPI  Patient is here to follow up on toe infection and repeat INR level today. Patient was started on Clindamycin on her last visit with Adriana on 11/19/16. Patient saw Dr Prudence Davidson podiatrist today 11/22/16 to be seen for toe infection. Clindamycin has been changed to Doxy and she has follow up with him on 12/03/16. INR was checked on 11/19/16 and level was 5.7. Coumadin is been held at this time. Patient was taking Coumadin at 4 mg daily prior to that. Patient Active Problem List   Diagnosis Date Noted  . Hypertension 12/20/2014  . Allergic rhinitis 11/16/2014  . Cannot sleep 11/16/2014  . Peripheral blood vessel disorder ([place]) 11/16/2014  . Phantom limb syndrome ([place]) 11/16/2014  . Reactive depression (situational) 11/16/2014  . Amputation of lower limb ([place]) 11/16/2014  . Diabetes mellitus, type 2 ([place]) 11/16/2014  . Amputation of leg, traumatic ([place]) 06/18/2012  . Osteomyelitis of ankle and foot ([place]) 02/20/2012  . Gangrene of foot ([place]) 02/20/2012  . DM (diabetes mellitus), secondary ([place]) 07/06/2005  . HLD (hyperlipidemia) 07/06/2005    Past Medical History:  Diagnosis Date  . Depression   . Hyperlipidemia 07/06/2005  . Hypertension     Social History   Social History  . Marital status: Married    Spouse name: N/A  . Number of children: N/A  . Years of education: N/A   Occupational History  . Not on file.   Social History Main Topics  . Smoking status: Never Smoker  . Smokeless tobacco: Never Used  . Alcohol use No  . Drug use: No  . Sexual activity: Not on file   Other Topics Concern  . Not on file   Social History Narrative  . No narrative on file    Outpatient Encounter Prescriptions as of 11/22/2016  Medication Sig Note  . amLODipine (NORVASC) 5 MG tablet 1 (one) Tablet, Oral, daily   . Cetirizine HCl (ZYRTEC ALLERGY) 10 MG CAPS Take by mouth. 11/16/2014: Received from: [facility]  . doxycycline (VIBRA-TABS) 100 MG tablet Take 1 tablet (100 mg total) by mouth 2 (two) times daily.   Marland Kitchen gabapentin (NEURONTIN) 300 MG capsule TAKE (1) CAPSULE TWICE DAILY AS NEEDED.   Marland Kitchen glipiZIDE (GLUCOTROL) 10 MG tablet Take 1 tablet (10 mg total) by mouth daily.   . hydrochlorothiazide (HYDRODIURIL) 25 MG tablet Take 1 tablet (25 mg total) by mouth daily.   Marland Kitchen losartan (COZAAR) 100 MG tablet Take 1 tablet (100 mg total) by mouth daily.   . metFORMIN (GLUCOPHAGE) 500 MG tablet 2 tablets daily   . metoprolol (LOPRESSOR) 50 MG tablet Take 1 tablet (50 mg total) by mouth 2 (two) times daily.   . mirtazapine (REMERON) 30 MG tablet Take 1 tablet (30 mg total) by mouth at bedtime.   . ondansetron (ZOFRAN) 4 MG tablet Take 1 tablet (4 mg total) by mouth every 8 (eight) hours as needed for nausea or vomiting.   . warfarin (COUMADIN) 4 MG tablet TAKE ONE TABLET DAILY AS DIRECTED.   Marland Kitchen warfarin (COUMADIN) 5 MG tablet Take by mouth. 11/16/2014: Received from: [facility]  . [DISCONTINUED] clindamycin (CLEOCIN) 300 MG capsule Take 2 capsules (600 mg total) by mouth 3 (three) times daily.    No facility-administered encounter medications on file as of 11/22/2016.     Allergies  Allergen Reactions  . Amoxicillin Other (See Comments)  . Azithromycin  vomiting and stomach upset    Review of Systems  Constitutional: Negative.   HENT: Negative.   Eyes: Negative.   Respiratory: Negative.   Cardiovascular: Negative.   Gastrointestinal: Negative.   Musculoskeletal: Positive for joint pain.       Right leg amputation present.  Skin: Negative.   Endo/Heme/Allergies: Negative.   Psychiatric/Behavioral: Negative.     Objective:  BP (!) 158/48   Pulse 60   Temp 98.6 F (37 C)   Resp 16   Physical Exam  Constitutional: She is oriented to person, place, and time and well-developed, well-nourished, and in no distress.  HENT:  Head: Normocephalic and atraumatic.  Right  Ear: External ear normal.  Left Ear: External ear normal.  Nose: Nose normal.  Eyes: Pupils are equal, round, and reactive to light. Conjunctivae are normal.  Neck: Normal range of motion. Neck supple.  Cardiovascular: Normal rate, regular rhythm and normal heart sounds.  Exam reveals decreased pulses. Exam reveals no gallop.   Pulmonary/Chest: Effort normal and breath sounds normal. No respiratory distress. She has no wheezes.  Musculoskeletal:  Right leg amputation  Neurological: She is alert and oriented to person, place, and time. Gait normal. GCS score is 15.  Skin: Skin is warm and dry.  Psychiatric: Mood, memory, affect and judgment normal.   Assessment and Plan :  1. Toe infection Following podiatrist.  2. Anticoagulation management encounter INR 1.7. Off coumadin at this time. Discussed with patient whether to stay on coumadin or stop. Will refer patient to hematologist for work up. Will stop coumadin at this time. Follow after hematology work up. - POCT INR More than 25 minutes spent in counselling regarding multiple issues with pt/granddaughter. 3. 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 4. Peripheral blood vessel disorder ([place]) Advised patient she should follow up with vein and vascular specialist. Patient does not want to see Dr Lucky Cowboy.  Ambulatory referral to Hematology  5. Phantom limb syndrome ([place]) 6. Amputation of lower limb (HCC)  HPI, Exam and A&P transcribed by Theressa Millard, RMA under direction and in the presence of Miguel Aschoff, MD. I have done the exam and reviewed the chart and it is accurate to the best of my knowledge. Development worker, community has been used and  any errors in dictation or transcription are unintentional. Miguel Aschoff M.D. [facility]

## 2016-11-22 NOTE — Patient Instructions (Addendum)
Stop Coumadin all together at this time. Ask Dr Prudence Davidson if you need to see a vein and vascular for follow up. If so we can refer you to someone in [place]. You will get a call about appointment with hematologist when this is set up.

## 2016-11-22 NOTE — Progress Notes (Signed)
   Subjective:    Patient ID: Melanie Diaz, female    DOB: 02-05-1934, 83 y.o.   MRN: [id number]  HPI this patient presents to the office with chief complaint of an open lesion on the tip of the fourth toe left foot.  She says she injured it at home when she jammed her toe.  She says she self treated at home with soaks, but went to see Dr. Rosanna Randy on Monday.  Dr. Rosanna Randy took x-rays and prescribed clindamycin 300 mg to be taken for an infection.  He then referred her to this office for an evaluation of the fourth toe.  Patient has a history of an amputation of the right leg and foot.  She presents the office today for an evaluation of her fourth toe left foot.  She says she has been soaking her foot and taken her antibiotics as prescribed.  She presents the office today for continued evaluation and treatment of her fourth toe left foot.  Review of Systems  All other systems reviewed and are negative.      Objective:   Physical Exam GENERAL APPEARANCE: Alert, conversant. Appropriately groomed. No acute distress.  VASCULAR: Pedal pulses are  palpable at  DP left foot.  PT is absent left foot.  Capillary refill time is immediate to all digits,  Normal temperature gradient.   NEUROLOGIC: sensation is diminished  to 5.07 monofilament at 5/5 sites left foot.  Light touch is intact bilateral, Muscle strength normal.  MUSCULOSKELETAL: acceptable muscle strength, tone and stability bilateral.  Intrinsic muscluature intact bilateral.  Rectus appearance of foot and digits noted bilateral. Amputation of her right foot and leg has been performed.  Examination of the fourth toe reveals the toe to be shortened in length. ULCER  3 x 3 mm ulcer is noted on the distal aspect fourth toe left foot.  Necrotic tissue noted in the center of the ulcer.  There is also a small skin lesion developing on the fifth toe left foot.   DERMATOLOGIC: Examination of the fourth toe reveals a red, swollen fourth toe localized to the  toe only.  No evidence of any streaking noted from the toe up the foot and leg.   Diabetic ulcer fourth toe left with possible bone infection.    Diabetic neuropathy left.  Cellulitis fourth toe left  IE.  X-ray was taken of the left foot and reveals the absence of the distal tuft of the distal phalanx fourth toe left foot.  Probable infectious process occurred.  My goal at this point is to clear up the infection and close the ulcer,  This toe still appears to be infected and therefore, I changed her antibiotics from clindamycin to doxycycline.  Gave her home instructions for soaks and to use Iodosorb at the ulcer site.   If this condition worsens or becomes very painful, the patient was told to contact this office or go to the Emergency Department at the hospital.  Her granddaughter says that she is proceeding over to Dr. Alben Spittle office for an update on her fourth toe left.  Return to the clinic in one week for further evaluation and treatment.  Prior to ordering doxycycline. We checked and the patient states that she has been no longer taking Coumadin per her doctor's recommendation.    Gardiner Barefoot DPM          Assessment & Plan:

## 2016-11-29 ENCOUNTER — Inpatient Hospital Stay: Payer: Medicare Other | Admitting: Oncology

## 2016-12-03 ENCOUNTER — Ambulatory Visit (INDEPENDENT_AMBULATORY_CARE_PROVIDER_SITE_OTHER): Payer: Medicare Other | Admitting: Podiatry

## 2016-12-03 ENCOUNTER — Encounter: Payer: Self-pay | Admitting: Oncology

## 2016-12-03 ENCOUNTER — Inpatient Hospital Stay: Payer: Medicare Other

## 2016-12-03 ENCOUNTER — Inpatient Hospital Stay: Payer: Medicare Other | Attending: Oncology | Admitting: Oncology

## 2016-12-03 VITALS — BP 181/68 | HR 67 | Temp 97.3°F | Resp 18 | Ht 62.6 in

## 2016-12-03 DIAGNOSIS — F329 Major depressive disorder, single episode, unspecified: Secondary | ICD-10-CM | POA: Diagnosis not present

## 2016-12-03 DIAGNOSIS — E785 Hyperlipidemia, unspecified: Secondary | ICD-10-CM | POA: Insufficient documentation

## 2016-12-03 DIAGNOSIS — Z7901 Long term (current) use of anticoagulants: Secondary | ICD-10-CM | POA: Insufficient documentation

## 2016-12-03 DIAGNOSIS — I10 Essential (primary) hypertension: Secondary | ICD-10-CM

## 2016-12-03 DIAGNOSIS — Z993 Dependence on wheelchair: Secondary | ICD-10-CM | POA: Insufficient documentation

## 2016-12-03 DIAGNOSIS — S88919A Complete traumatic amputation of unspecified lower leg, level unspecified, initial encounter: Secondary | ICD-10-CM

## 2016-12-03 DIAGNOSIS — Z7984 Long term (current) use of oral hypoglycemic drugs: Secondary | ICD-10-CM | POA: Insufficient documentation

## 2016-12-03 DIAGNOSIS — E11621 Type 2 diabetes mellitus with foot ulcer: Secondary | ICD-10-CM | POA: Insufficient documentation

## 2016-12-03 DIAGNOSIS — L03032 Cellulitis of left toe: Secondary | ICD-10-CM

## 2016-12-03 DIAGNOSIS — L02612 Cutaneous abscess of left foot: Secondary | ICD-10-CM | POA: Diagnosis not present

## 2016-12-03 DIAGNOSIS — L97521 Non-pressure chronic ulcer of other part of left foot limited to breakdown of skin: Secondary | ICD-10-CM

## 2016-12-03 DIAGNOSIS — E1169 Type 2 diabetes mellitus with other specified complication: Secondary | ICD-10-CM

## 2016-12-03 DIAGNOSIS — E1159 Type 2 diabetes mellitus with other circulatory complications: Secondary | ICD-10-CM

## 2016-12-03 DIAGNOSIS — Z79899 Other long term (current) drug therapy: Secondary | ICD-10-CM | POA: Diagnosis not present

## 2016-12-03 DIAGNOSIS — I82503 Chronic embolism and thrombosis of unspecified deep veins of lower extremity, bilateral: Secondary | ICD-10-CM | POA: Insufficient documentation

## 2016-12-03 LAB — CBC WITH DIFFERENTIAL/PLATELET
BASOS ABS: 0.1 10*3/uL (ref 0–0.1)
BASOS PCT: 1 %
EOS ABS: 0.2 10*3/uL (ref 0–0.7)
Eosinophils Relative: 3 %
HCT: 26.9 % — ABNORMAL LOW (ref 35.0–47.0)
Hemoglobin: 9.2 g/dL — ABNORMAL LOW (ref 12.0–16.0)
Lymphocytes Relative: 35 %
Lymphs Abs: 3.1 10*3/uL (ref 1.0–3.6)
MCH: 26.5 pg (ref 26.0–34.0)
MCHC: 34.1 g/dL (ref 32.0–36.0)
MCV: 77.7 fL — ABNORMAL LOW (ref 80.0–100.0)
Monocytes Absolute: 0.7 10*3/uL (ref 0.2–0.9)
Monocytes Relative: 8 %
NEUTROS ABS: 4.7 10*3/uL (ref 1.4–6.5)
NEUTROS PCT: 53 %
Platelets: 432 10*3/uL (ref 150–440)
RBC: 3.46 MIL/uL — AB (ref 3.80–5.20)
RDW: 15.2 % — ABNORMAL HIGH (ref 11.5–14.5)
WBC: 8.8 10*3/uL (ref 3.6–11.0)

## 2016-12-03 LAB — PROTIME-INR
INR: 0.98
Prothrombin Time: 13 seconds (ref 11.4–15.2)

## 2016-12-03 LAB — COMPREHENSIVE METABOLIC PANEL
ALBUMIN: 2.7 g/dL — AB (ref 3.5–5.0)
ALT: 19 U/L (ref 14–54)
ANION GAP: 9 (ref 5–15)
AST: 17 U/L (ref 15–41)
Alkaline Phosphatase: 81 U/L (ref 38–126)
BUN: 34 mg/dL — AB (ref 6–20)
CALCIUM: 9.3 mg/dL (ref 8.9–10.3)
CO2: 22 mmol/L (ref 22–32)
CREATININE: 0.95 mg/dL (ref 0.44–1.00)
Chloride: 99 mmol/L — ABNORMAL LOW (ref 101–111)
GFR calc Af Amer: >60 mL/min (ref >60)
GFR calc non Af Amer: 55 mL/min — ABNORMAL LOW (ref >60)
GLUCOSE: 94 mg/dL (ref 65–99)
Potassium: 4.3 mmol/L (ref 3.5–5.1)
SODIUM: 130 mmol/L — AB (ref 135–145)
Total Bilirubin: 0.2 mg/dL — ABNORMAL LOW (ref 0.3–1.2)
Total Protein: 6.4 g/dL — ABNORMAL LOW (ref 6.5–8.1)

## 2016-12-03 LAB — FIBRIN DERIVATIVES D-DIMER (ARMC ONLY): Fibrin derivatives D-dimer (ARMC): 3383.93 — ABNORMAL HIGH (ref 0.00–499.00)

## 2016-12-03 LAB — APTT: aPTT: 32 seconds (ref 24–36)

## 2016-12-03 NOTE — Progress Notes (Signed)
This patient presents the office for continued evaluation of a diabetic ulcer on the fourth toe of the left foot. Patient was seen in the office 1 week ago and she was given Iodosorb to apply to the ulcer site as well as bandaging instructions. She has a history of an amputation of her right leg and foot.  She says she has been taking her doxycycline as directed. Even though it has caused her to have headache and loss of appetite.  She says that her toe is doing much better at this time.  GENERAL APPEARANCE: Alert, conversant. Appropriately groomed. No acute distress.  VASCULAR: Pedal pulses are  palpable at  DP left foot.  PT is absent left foot..  Capillary refill time is immediate to all digits,  Normal temperature gradient.  Digital hair growth is present bilateral  NEUROLOGIC: sensation is diminished  to 5.07 monofilament at 5/5 sites left foot.Sunday Corn touch is intact left foot., Muscle strength normal.  MUSCULOSKELETAL: acceptable muscle strength, tone and stability bilateral.  Intrinsic muscluature intact bilateral.  Rectus appearance of foot and digits noted bilateral.  ULCER  . There is a persistent 3 x 3 mm ulcer noted at the distal aspect of the fourth toe left foot.  A protective covering/scab is forming at the site of the ulcer.  There is persistent localized redness and swelling.   No drainage or pus noted. DERMATOLOGIC: skin color, texture, and turgor are within normal limits.  No preulcerative lesions or ulcers  are seen, no interdigital maceration noted.  No open lesions present.  Digital nails are asymptomatic. No drainage noted.   Diabetic ulcer 4th toe left.  Possible OM 4th toe left.  Neuropathy.  Return office visit.  Swelling at the base of the fourth toe left has subsided and resolved.  There is continued redness and swelling around the distal aspect of the fourth toe.  The ulcer is still open but appears to be healing.  Pain has subsided.  Patient was told to continue to take a  new round of doxycycline medication and continue soaks and bandaging.  Since this patient is diabetic. I told her that she should return to the office in 2 weeks for further evaluation and treatment   Gardiner Barefoot DPM

## 2016-12-03 NOTE — Progress Notes (Addendum)
[facility] Initial Visit:  Patient Care Team: [name]., MD as PCP - General (Family Medicine)  CHIEF COMPLAINTS/PURPOSE OF CONSULTATION:  HISTORY OF PRESENTING ILLNESS: Melanie Diaz 83 y.o. female with PMH listed as below is referred to our clinic for evaluation of history of DVT and management. Patient is a poor historian. Patient told me that her first episode of clot was about 30 years ago. She cannot remember the details, but remembering taking her lower extremity and feel pain and swelling. She reports that she was started on Coumadin for anticoagulation for about 2-3 months. Patient has poor for arthritic disease and she developed a nonhealing ulcer on her right great toe. The ulcer became more severe and she patient required a midfoot amputation in August 2013. Unfortunately her wound did not heal and became gangrenous and the patient required additional debridement. Eventually she underwent a below knee amputation on 04/24/2012. Patient reports that she was told that she had another episode of clots sometime perry operation. Of note on November 6 06/21/2015 when she saw her primary care doctor Dr. Rosanna Randy, it was mentioned that Coumadin was listed on her medication but Dr. Rosanna Randy was not sure if she is taking it. And there was no INR documented in the chart at that time. Dr. Micki Riley patient's Coumadin and the patient supposed to follow up with Dr. Rosanna Randy for INR checks. It appears that patient did not follow up on checking her INR. Patient reports that since Dr. Rosanna Randy started her on Coumadin she has been taking it for roughly a year and half or 2 years. Most recently when she presented to Dr. Marlan Palau office for infected toe, her INR was 5.7 and her Coumadin was stopped due to supratherapeutic INR. Patient has not had INR checked for a while. Patient was referred to see [place] for recommendation on the management of her DVT.   Patient told me that  she currently lives by herself, her granddaughter and one of her daughters help with her daily life. Due to the amputation. She is wheelchair bound. She reports left lower extremity swelling chronically and attribute to not taking her water pill on time. She denies any recent falls. She did admit that she hasn't been follow-up with Dr. Rosanna Randy and have INR checked recently. Patient's husband passed away 3 months back. Patient appears to be quite emotional when talking about that.  Review of Systems  Constitutional: Negative.   HENT:  Negative.   Eyes: Negative.   Respiratory: Negative.   Cardiovascular: Negative.   Gastrointestinal: Negative.   Endocrine: Negative.   Genitourinary: Negative.    Musculoskeletal: Negative.   Skin: Negative.   Neurological: Negative.   Hematological: Negative.   Psychiatric/Behavioral: Negative.     MEDICAL HISTORY: Past Medical History:  Diagnosis Date  . Depression   . Diabetes mellitus without complication ([place])   . History of deep vein thrombosis    about 30 years ago  . Hyperlipidemia 07/06/2005  . Hypertension     SURGICAL HISTORY: Past Surgical History:  Procedure Laterality Date  . LEG AMPUTATION Right     SOCIAL HISTORY: Social History   Social History  . Marital status: Married    Spouse name: N/A  . Number of children: N/A  . Years of education: N/A   Occupational History  . Not on file.   Social History Main Topics  . Smoking status: Never Smoker  . Smokeless tobacco: Never Used  . Alcohol use  No  . Drug use: No  . Sexual activity: Not on file   Other Topics Concern  . Not on file   Social History Narrative  . No narrative on file    FAMILY HISTORY Family History  Problem Relation Age of Onset  . Kidney failure Mother   . Heart attack Sister        Hx of MI  . Diabetes Brother        borderline diabetes  . Cancer Maternal Aunt        Ovarian cancer    ALLERGIES:  is allergic to amoxicillin and  azithromycin.  MEDICATIONS:  Current Outpatient Prescriptions  Medication Sig Dispense Refill  . amLODipine (NORVASC) 5 MG tablet 1 (one) Tablet, Oral, daily 90 tablet 1  . Cetirizine HCl (ZYRTEC ALLERGY) 10 MG CAPS Take by mouth.    . doxycycline (VIBRA-TABS) 100 MG tablet Take 1 tablet (100 mg total) by mouth 2 (two) times daily. 20 tablet 0  . gabapentin (NEURONTIN) 300 MG capsule TAKE (1) CAPSULE TWICE DAILY AS NEEDED. 180 capsule 0  . glipiZIDE (GLUCOTROL) 10 MG tablet Take 1 tablet (10 mg total) by mouth daily. 90 tablet 0  . hydrochlorothiazide (HYDRODIURIL) 25 MG tablet Take 1 tablet (25 mg total) by mouth daily. 90 tablet 1  . losartan (COZAAR) 100 MG tablet Take 1 tablet (100 mg total) by mouth daily. 90 tablet 0  . metFORMIN (GLUCOPHAGE) 500 MG tablet 2 tablets daily 180 tablet 1  . metoprolol (LOPRESSOR) 50 MG tablet Take 1 tablet (50 mg total) by mouth 2 (two) times daily. 180 tablet 1  . mirtazapine (REMERON) 30 MG tablet Take 1 tablet (30 mg total) by mouth at bedtime. 90 tablet 0  . ondansetron (ZOFRAN) 4 MG tablet Take 1 tablet (4 mg total) by mouth every 8 (eight) hours as needed for nausea or vomiting. 20 tablet 0   No current facility-administered medications for this visit.     PHYSICAL EXAMINATION:  ECOG PERFORMANCE STATUS: 2 - Symptomatic, <50% confined to bed   Vitals:   12/03/16 1604  BP: (!) 181/68  Pulse: 67  Resp: 18  Temp: (!) 97.3 F (36.3 C)    Filed Weights  Weight not measured as patient is in wheelchair and not able to stand onto the scale.    Physical Exam  GENERAL: No distress, well nourished.  SKIN:  No rashes or significant lesions  HEAD: Normocephalic, No masses, lesions, tenderness or abnormalities  EYES: Conjunctiva are pink, non icteric ENT: External ears normal ,lips , buccal mucosa, and tongue normal and mucous membranes are moist  LYMPH: No palpable cervical and axillary lymphadenopathy  LUNGS: Clear to auscultation, no  crackles or wheezes HEART: Regular rate & rhythm, no murmurs, no gallops, S1 normal and S2 normal  ABDOMEN: Abdomen soft, non-tender, normal bowel sounds MUSCULOSKELETAL: No CVA tenderness and no tenderness on percussion of the back or rib cage.  EXTREMITIES: left ankle with +2 edema, right LLE + below knee amputation.   NEURO: Alert & oriented, no focal motor/sensory deficits.   LABORATORY DATA: I have personally reviewed the data as listed:  Office Visit on 11/22/2016  Component Date Value Ref Range Status  . INR 11/22/2016 1.7   Final  . PT 11/22/2016 20.0   Final  Office Visit on 11/19/2016  Component Date Value Ref Range Status  . INR 11/19/2016 5.7   Final  . PT 11/19/2016 68.7   Final  . Hemoglobin A1C 11/19/2016  7.1   Final  . Est. average glucose Bld gHb Est-m* 11/19/2016 157   Final    ASSESSMENT/PLAN 1. Chronic deep vein thrombosis (DVT) of both lower extremities, unspecified vein (HCC)   2. Chronic anticoagulation   3. Amputation of lower limb ([place])   4. Type 2 diabetes mellitus with other specified complication, without long-term current use of insulin ([place])   5. Essential hypertension   . Orders Placed This Encounter  Procedures  . US Venous Img Lower Bilateral    Standing Status:   Future    Standing Expiration Date:   12/03/2017    Order Specific Question:   Reason for Exam (SYMPTOM  OR DIAGNOSIS REQUIRED)    Answer:   hx of dvt, also below the knee amputation    Order Specific Question:   Preferred imaging location?    Answer:   [facility]  . CBC with Differential/Platelet    Standing Status:   Future    Number of Occurrences:   1    Standing Expiration Date:   12/03/2017  . Comprehensive metabolic panel    Standing Status:   Future    Number of Occurrences:   1    Standing Expiration Date:   12/03/2017  . Fibrin derivatives D-Dimer ([facility] only)    Standing Status:   Future    Number of Occurrences:   1    Standing Expiration Date:   12/03/2017  .  Protime-INR    Standing Status:   Future    Number of Occurrences:   1    Standing Expiration Date:   12/03/2017  . APTT    Standing Status:   Future    Number of Occurrences:   1    Standing Expiration Date:   12/03/2017  . ANTIPHOSPHOLIPID SYNDROME PROF    Standing Status:   Future    Number of Occurrences:   1    Standing Expiration Date:   12/03/2017  . Prothrombin gene mutation    Standing Status:   Future    Number of Occurrences:   1    Standing Expiration Date:   12/03/2017  . Factor 5 leiden    Standing Status:   Future    Number of Occurrences:   1    Standing Expiration Date:   12/03/2017  . Protein C activity    Standing Status:   Future    Number of Occurrences:   1    Standing Expiration Date:   12/03/2017  . Protein S activity    Standing Status:   Future    Number of Occurrences:   1    Standing Expiration Date:   12/03/2017   # I discussed with patient that as she has had at least DVT in the past, chances of recurrence DVT is high. Studies have shown that recurrent VTE after 2nd event is about 6.7%. Continuing long term anticoagulation is ideal. However, patient appears not complaint with following up with INRs and has had supratheraputic INR. On the other side, patient have multiple bleeding risk factors, including age >55, diabetes, anemia, poor anticoagulant control, reduced functional capacity, total of 5 risk factor which belongs to the high risk group, at about 6.5%.  I discussed with patient that her bleeding risk and recurrence VTE risk are similar. Either way, she will have some risks of VTE or bleeding. Currently she has been of Warfarin for 2 weeks. For now, I advice her to take Aspirin 81mg  daily.  #  I will check her D-dimer, CBC, CMP, repeat bilateral lower extremity venous doppler, hypercoagulable work up. If all negative, I recommend to stay off anticoagulation and take Aspirin instead.  # If any of above is positive, she has additional weight on VTE risks, I  will recommend her to start on Eliquis 2.5mg  daily which actually has been shown to be as effective as Warfarin but less bleeding risk.  # Continue to follow up with primary care physician regarding her chronic medical problems.   Patient understands the rationale and plan. I will see her back in 1-2 weeks to discuss the plan.  All questions were answered. The patient knows to call the clinic with any problems, questions or concerns. Thank you for this kind referral and the opportunity to participate in the care of this patient.   Earlie Server MD PhD [facility] [phone] Phone: [phone] 11/26/2016

## 2016-12-03 NOTE — Progress Notes (Signed)
Pt had 3 dvt about 30 years ago and was put on coumadin and then taken off.  Then in 2013 she had infected toe due to diabetes and she was going to have it removed then infection was higher up and they removed ankle and then infection still there and had below the knee amputation. She had been on coumadin again until 2 weeks ago when she was taken off and sent here to decide on whether she should stay on it or come off permanently

## 2016-12-04 ENCOUNTER — Telehealth: Payer: Self-pay | Admitting: Podiatry

## 2016-12-04 LAB — PROTEIN S ACTIVITY: PROTEIN S ACTIVITY: 72 % (ref 63–140)

## 2016-12-04 LAB — PROTEIN C ACTIVITY: Protein C Activity: 125 % (ref 73–180)

## 2016-12-04 MED ORDER — DOXYCYCLINE HYCLATE 100 MG PO TABS
100.0000 mg | ORAL_TABLET | Freq: Two times a day (BID) | ORAL | 0 refills | Status: DC
Start: 2016-12-04 — End: 2017-08-14

## 2016-12-04 NOTE — Telephone Encounter (Signed)
Dr. Prudence Davidson was supposed to call me in an prescription yesterday and the pharmacy does not have it yet. Please call me back at [phone]. Thank you.

## 2016-12-04 NOTE — Telephone Encounter (Signed)
Rx for antibiotic has been sent to pharmacy and pt has been informed of rx

## 2016-12-04 NOTE — Addendum Note (Signed)
Addended by: Graceann Congress D on: 12/04/2016 04:25 PM   Modules accepted: Orders

## 2016-12-05 ENCOUNTER — Telehealth: Payer: Self-pay | Admitting: *Deleted

## 2016-12-05 LAB — ANTIPHOSPHOLIPID SYNDROME PROF
Anticardiolipin IgG: <9 GPL U/mL (ref 0–14)
Anticardiolipin IgM: <9 MPL U/mL (ref 0–12)
DRVVT: 41.2 s (ref 0.0–47.0)
PTT LA: 32.5 s (ref 0.0–51.9)

## 2016-12-05 MED ORDER — DOXYCYCLINE HYCLATE 100 MG PO CAPS: 100.0000 mg | ORAL_CAPSULE | Freq: Two times a day (BID) | ORAL | 0 refills | Status: DC

## 2016-12-05 NOTE — Telephone Encounter (Signed)
Pt states Dr. Prudence Davidson was to call in a prescription, it is not at the pharmacy.  12/05/2016-I reviewed LOV and Dr. Prudence Davidson had ordered Doxycycline. I informed pt the medication had been sent to [facility].

## 2016-12-07 ENCOUNTER — Ambulatory Visit: Payer: Medicare Other

## 2016-12-07 LAB — PROTHROMBIN GENE MUTATION

## 2016-12-10 LAB — FACTOR 5 LEIDEN

## 2016-12-12 ENCOUNTER — Ambulatory Visit
Admission: RE | Admit: 2016-12-12 | Discharge: 2016-12-12 | Disposition: A | Discharge status: Home / Self Care | Payer: Medicare Other | Source: Ambulatory Visit | Attending: Oncology | Admitting: Oncology

## 2016-12-12 ENCOUNTER — Telehealth: Payer: Self-pay | Admitting: Internal Medicine

## 2016-12-12 ENCOUNTER — Other Ambulatory Visit: Payer: Self-pay | Admitting: Family Medicine

## 2016-12-12 DIAGNOSIS — I82432 Acute embolism and thrombosis of left popliteal vein: Secondary | ICD-10-CM | POA: Diagnosis not present

## 2016-12-12 DIAGNOSIS — I82503 Chronic embolism and thrombosis of unspecified deep veins of lower extremity, bilateral: Secondary | ICD-10-CM | POA: Diagnosis not present

## 2016-12-12 DIAGNOSIS — I82412 Acute embolism and thrombosis of left femoral vein: Secondary | ICD-10-CM | POA: Diagnosis not present

## 2016-12-12 MED ORDER — APIXABAN 5 MG PO TABS: 5.0000 mg | ORAL_TABLET | Freq: Two times a day (BID) | ORAL | 0 refills | Status: DC

## 2016-12-12 NOTE — Telephone Encounter (Signed)
Left popliteal occlusive DVT/ new; [phone]- Spoke to pts daughter-Angie ; will prescribe Eliquis 5 mg BID [lower dose].   Zhou- please follow up. Thx

## 2016-12-12 NOTE — Telephone Encounter (Signed)
[place] faxed refill request for 90 day supply of the following medications: losartan (COZAAR) 100 MG tablet   Last Rx: 09/20/16 90 day supply no refills LOV: 11/22/16 Please advise. Thanks TNP

## 2016-12-13 MED ORDER — LOSARTAN POTASSIUM 100 MG PO TABS: 100.0000 mg | ORAL_TABLET | Freq: Every day | ORAL | 1 refills | Status: DC

## 2016-12-17 ENCOUNTER — Inpatient Hospital Stay: Payer: Medicare Other | Admitting: Oncology

## 2016-12-17 ENCOUNTER — Other Ambulatory Visit: Payer: Self-pay | Admitting: Family Medicine

## 2016-12-20 ENCOUNTER — Ambulatory Visit: Payer: Medicare Other | Admitting: Podiatry

## 2016-12-24 ENCOUNTER — Other Ambulatory Visit: Payer: Self-pay | Admitting: Family Medicine

## 2016-12-24 ENCOUNTER — Ambulatory Visit (INDEPENDENT_AMBULATORY_CARE_PROVIDER_SITE_OTHER): Payer: Medicare Other | Admitting: Podiatry

## 2016-12-24 DIAGNOSIS — L97521 Non-pressure chronic ulcer of other part of left foot limited to breakdown of skin: Secondary | ICD-10-CM | POA: Diagnosis not present

## 2016-12-24 DIAGNOSIS — E11621 Type 2 diabetes mellitus with foot ulcer: Secondary | ICD-10-CM

## 2016-12-24 DIAGNOSIS — E1159 Type 2 diabetes mellitus with other circulatory complications: Secondary | ICD-10-CM

## 2016-12-24 DIAGNOSIS — S88911S Complete traumatic amputation of right lower leg, level unspecified, sequela: Secondary | ICD-10-CM | POA: Diagnosis not present

## 2016-12-24 NOTE — Progress Notes (Signed)
This patient presents the office for continued evaluation of a diabetic ulcer on the fourth toe of the left foot. Patient says that she has been soaking her toe and bandaging her toe as instructed.  She also has been taking doxycycline as prescribed. She has a history of an amputation of her right leg and foot.    She says that her toe is doing much better at this time. She says the toe still remains red at the tip but has improved from her initial visit.  GENERAL APPEARANCE: Alert, conversant. Appropriately groomed. No acute distress.  VASCULAR: Pedal pulses are  palpable at  DP left foot.  PT is absent left foot..  Capillary refill time is immediate to all digits,  Normal temperature gradient.   NEUROLOGIC: sensation is diminished  to 5.07 monofilament at 5/5 sites left foot.Sunday Corn touch is diminished  left foot., Muscle strength normal.  MUSCULOSKELETAL: acceptable muscle strength, tone and stability bilateral.  Intrinsic muscluature intact bilateral.  Rectus appearance of foot and digits . Amputation right leg.  ULCER  . There is a persistent 2 x 2  mm ulcer noted at the distal aspect of the fourth toe left foot.  A protective covering/scab is forming at the site of the ulcer.  There is persistent localized redness and swelling.   No drainage or pus noted. DERMATOLOGIC: skin color, texture, and turgor are within normal limitsleft foot except ulcer fourth toe.    Digital nails are asymptomatic. No drainage noted.   Diabetic ulcer 4th toe left.  Possible OM 4th toe left.  Neuropathy.  Return office visit.  Examination of her fourth toe reveals a persistent ulcer on the distal aspect fourth toe left foot.  This ulcer appears to be doing well and no evidence of pus drainage or infection is noted.  A hyperkeratotic crust is trying to develop at the site of the ulcer.  No evidence of any pain is noted to the fourth toe.  I discussed this also with this patient.  I told her the ulcer is still open without  any drainage or infection noted.  I have not witnessed any improvement in the size of the ulcer except  infection has resolved from the initial visit.  I rechecked her circulatory status and her DP pulse is present.  I discussed with this patient that she needs to continue home soaks and to bandage the ulcer when wearing shoes. Further discussion of her ulcer revealed that since this patient has no right extremity. She uses her left foot digits to propel her wheelchair and, thus says it is preventing the toe from completely healing.  I told this patient that if she desires a second opinion. She can be seen by Dr. Amalia Hailey or [place] at this office.  I then gave her padding to cover the fourth toe left foot which she can wear when she is mobile with her wheelchair.  Told this patient to return to the office in 4 weeks for further evaluation and treatment.  If the problem persists or worsens, she should call the office and be seen.    Gardiner Barefoot DPM

## 2017-01-15 ENCOUNTER — Telehealth: Payer: Self-pay | Admitting: Family Medicine

## 2017-01-15 NOTE — Telephone Encounter (Signed)
LMTCB regarding scheduling AWV and CPE

## 2017-01-31 ENCOUNTER — Ambulatory Visit: Payer: Medicare Other | Admitting: Podiatry

## 2017-02-07 ENCOUNTER — Other Ambulatory Visit: Payer: Self-pay | Admitting: Family Medicine

## 2017-03-05 ENCOUNTER — Ambulatory Visit (INDEPENDENT_AMBULATORY_CARE_PROVIDER_SITE_OTHER): Payer: Medicare Other | Admitting: Family Medicine

## 2017-03-05 ENCOUNTER — Encounter: Payer: Self-pay | Admitting: Family Medicine

## 2017-03-05 VITALS — BP 174/78 | HR 48 | Temp 98.3°F | Resp 16

## 2017-03-05 DIAGNOSIS — I10 Essential (primary) hypertension: Secondary | ICD-10-CM | POA: Diagnosis not present

## 2017-03-05 DIAGNOSIS — T879 Unspecified complications of amputation stump: Secondary | ICD-10-CM

## 2017-03-05 DIAGNOSIS — E1169 Type 2 diabetes mellitus with other specified complication: Secondary | ICD-10-CM | POA: Diagnosis not present

## 2017-03-05 DIAGNOSIS — Z89619 Acquired absence of unspecified leg above knee: Secondary | ICD-10-CM

## 2017-03-05 DIAGNOSIS — E7849 Other hyperlipidemia: Secondary | ICD-10-CM

## 2017-03-05 DIAGNOSIS — S88919A Complete traumatic amputation of unspecified lower leg, level unspecified, initial encounter: Secondary | ICD-10-CM

## 2017-03-05 MED ORDER — AMLODIPINE BESYLATE 10 MG PO TABS: 10.0000 mg | ORAL_TABLET | Freq: Every day | ORAL | 5 refills | Status: DC

## 2017-03-05 NOTE — Progress Notes (Signed)
Patient: Melanie Diaz Female    DOB: 06/20/1933   83 y.o.   MRN: [id number] Visit Date: 03/05/2017  Today's Provider: Wilhemena Durie, MD   Chief Complaint  Patient presents with  . Diabetes  . Hypertension  . Hyperlipidemia   Subjective:    HPI  Hypertension, follow-up:  BP Readings from Last 3 Encounters:  03/05/17 (!) 174/78  12/03/16 (!) 181/68  11/22/16 (!) 158/48    She was last seen for hypertension 1 years ago.  BP at that visit was 158/48. Management since that visit includes no changes. She reports good compliance with treatment. She is not having side effects.  She is not exercising. She is adherent to low salt diet.   Outside blood pressures are checked daily. She is experiencing none.  Patient denies exertional chest pressure/discomfort, fatigue and palpitations.    Weight trend: stable Wt Readings from Last 3 Encounters:  03/19/16 137 lb (62.1 kg)    Current diet: well balanced     Lipid/Cholesterol, Follow-up:   Last seen for this1 years ago.  Management changes since that visit include increased Zocor. . Last Lipid Panel:    Component Value Date/Time   CHOL 262 (H) 12/20/2014 1511   TRIG 309 (H) 12/20/2014 1511   HDL 36 (L) 12/20/2014 1511   LDLCALC 164 (H) 12/20/2014 1511    Risk factors for vascular disease include diabetes mellitus and hypertension  She reports good compliance with treatment. She is not having side effects.  Current symptoms include none and have been stable. Weight trend: stable Prior visit with dietician: no Current diet: well balanced Current exercise: none  Wt Readings from Last 3 Encounters:  03/19/16 137 lb (62.1 kg)      Diabetes Mellitus Type II, Follow-up:   Lab Results  Component Value Date   HGBA1C 7.1 11/19/2016   HGBA1C 7.6 03/19/2016   HGBA1C 7.1 (A) 10/06/2013    Last seen for diabetes 1 years ago.  Management since then includes no changes. She reports good  compliance with treatment. She is not having side effects.  Current symptoms include none and have been stable. Home blood sugar records: trend: stable  Episodes of hypoglycemia? no   Current Insulin Regimen: none Most Recent Eye Exam: due  Weight trend: stable Prior visit with dietician: no Current diet: well balanced Current exercise: none  Pertinent Labs:    Component Value Date/Time   CHOL 262 (H) 12/20/2014 1511   TRIG 309 (H) 12/20/2014 1511   HDL 36 (L) 12/20/2014 1511   LDLCALC 164 (H) 12/20/2014 1511   CREATININE 0.95 12/03/2016 1645   CREATININE 0.42 (L) 12/22/2011 0507    Wt Readings from Last 3 Encounters:  03/19/16 137 lb (62.1 kg)    Pt has requested referral to surgery for possible revision of her stump as prosthesis no longer fits at all.      Allergies  Allergen Reactions  . Amoxicillin Other (See Comments)  . Azithromycin     vomiting and stomach upset     Current Outpatient Prescriptions:  .  amLODipine (NORVASC) 5 MG tablet, 1 (one) Tablet, Oral, daily, Disp: 90 tablet, Rfl: 2 .  apixaban (ELIQUIS) 5 MG TABS tablet, Take 1 tablet (5 mg total) by mouth 2 (two) times daily., Disp: 60 tablet, Rfl: 0 .  Cetirizine HCl (ZYRTEC ALLERGY) 10 MG CAPS, Take 1 capsule by mouth daily. , Disp: , Rfl:  .  doxycycline (VIBRAMYCIN) 100 MG capsule,  Take 1 capsule (100 mg total) by mouth 2 (two) times daily., Disp: 20 capsule, Rfl: 0 .  gabapentin (NEURONTIN) 300 MG capsule, TAKE (1) CAPSULE TWICE DAILY AS NEEDED., Disp: 180 capsule, Rfl: 0 .  glipiZIDE (GLUCOTROL) 10 MG tablet, Take 1 tablet (10 mg total) by mouth daily., Disp: 90 tablet, Rfl: 0 .  glipiZIDE (GLUCOTROL) 10 MG tablet, Take 1 tablet (10 mg total) by mouth daily., Disp: 30 tablet, Rfl: 5 .  hydrochlorothiazide (HYDRODIURIL) 25 MG tablet, Take 1 tablet (25 mg total) by mouth daily., Disp: 90 tablet, Rfl: 3 .  losartan (COZAAR) 100 MG tablet, Take 1 tablet (100 mg total) by mouth daily., Disp: 90  tablet, Rfl: 1 .  metFORMIN (GLUCOPHAGE) 500 MG tablet, 2 tablets daily, Disp: 180 tablet, Rfl: 2 .  metoprolol (LOPRESSOR) 50 MG tablet, Take 1 tablet (50 mg total) by mouth 2 (two) times daily., Disp: 180 tablet, Rfl: 1 .  metoprolol tartrate (LOPRESSOR) 50 MG tablet, Take 1 tablet (50 mg total) by mouth 2 (two) times daily., Disp: 60 tablet, Rfl: 5 .  mirtazapine (REMERON) 30 MG tablet, Take 1 tablet (30 mg total) by mouth at bedtime., Disp: 90 tablet, Rfl: 0 .  ondansetron (ZOFRAN) 4 MG tablet, Take 1 tablet (4 mg total) by mouth every 8 (eight) hours as needed for nausea or vomiting., Disp: 20 tablet, Rfl: 0 .  doxycycline (VIBRA-TABS) 100 MG tablet, Take 1 tablet (100 mg total) by mouth 2 (two) times daily. (Patient not taking: Reported on 03/05/2017), Disp: 20 tablet, Rfl: 0  Review of Systems  Constitutional: Negative.   Eyes: Negative.   Respiratory: Negative.   Cardiovascular: Negative.  Negative for chest pain, palpitations and leg swelling.  Musculoskeletal: Negative.   Allergic/Immunologic: Negative.   Neurological: Negative.   Psychiatric/Behavioral: Negative.     Social History  Substance Use Topics  . Smoking status: Never Smoker  . Smokeless tobacco: Never Used  . Alcohol use No   Objective:   BP (!) 174/78 (BP Location: Right Arm, Patient Position: Sitting, Cuff Size: Normal)   Pulse (!) 48   Temp 98.3 F (36.8 C)   Resp 16   SpO2 99%  Vitals:   03/05/17 1701  BP: (!) 174/78  Pulse: (!) 48  Resp: 16  Temp: 98.3 F (36.8 C)  SpO2: 99%     Physical Exam  Constitutional: She is oriented to person, place, and time. She appears well-developed and well-nourished.  HENT:  Head: Normocephalic and atraumatic.  Right Ear: External ear normal.  Left Ear: External ear normal.  Nose: Nose normal.  Eyes: Conjunctivae are normal. No scleral icterus.  Neck: No thyromegaly present.  Cardiovascular: Normal rate, regular rhythm and normal heart sounds.     Pulmonary/Chest: Effort normal and breath sounds normal.  Abdominal: Soft.  Musculoskeletal: She exhibits edema.  Rt BKA stump.  Neurological: She is alert and oriented to person, place, and time.  Skin: Skin is warm and dry.  Psychiatric: She has a normal mood and affect. Her behavior is normal. Judgment and thought content normal.        Assessment & Plan:     1. Type 2 diabetes mellitus with other specified complication, without long-term current use of insulin (HCC)  - CBC with Differential/Platelet - Hemoglobin A1c  2. Essential hypertension  - CBC with Differential/Platelet - Comprehensive metabolic panel - TSH - amLODipine (NORVASC) 10 MG tablet; Take 1 tablet (10 mg total) by mouth daily.  Dispense: 30 tablet;  Refill: 5  3. Other hyperlipidemia  - Lipid panel  4. Amputation of lower limb ([place])   5. BKA stump complication ([place])  - Ambulatory referral to  Surgery  6.Noncompliance with followup  I have done the exam and reviewed the chart and it is accurate to the best of my knowledge. Development worker, community has been used and  any errors in dictation or transcription are unintentional. Miguel Aschoff M.D. [place], MD  [facility]

## 2017-03-05 NOTE — Patient Instructions (Signed)
Increase amlodipine to 10mg  daily. Follow up in 2 months.

## 2017-03-07 ENCOUNTER — Telehealth: Payer: Self-pay | Admitting: Family Medicine

## 2017-03-07 DIAGNOSIS — T879 Unspecified complications of amputation stump: Secondary | ICD-10-CM

## 2017-03-07 NOTE — Telephone Encounter (Signed)
Per general surgeons in area they will not make corrections to stump to fit a prosthetic.Can you put a referral in for Ortho instead ? [facility] states they have doctors that can do this

## 2017-03-07 NOTE — Telephone Encounter (Signed)
Order placed-Melanie Diaz [name], RMA

## 2017-03-07 NOTE — Telephone Encounter (Signed)
Ok to do?-Dinia Joynt V Corin Tilly, RMA  

## 2017-03-07 NOTE — Telephone Encounter (Signed)
ok 

## 2017-04-22 ENCOUNTER — Ambulatory Visit: Payer: Self-pay | Admitting: Family Medicine

## 2017-05-01 ENCOUNTER — Ambulatory Visit: Payer: Self-pay | Admitting: Family Medicine

## 2017-05-16 ENCOUNTER — Ambulatory Visit: Payer: Medicare Other | Admitting: Family Medicine

## 2017-05-16 ENCOUNTER — Telehealth: Payer: Self-pay | Admitting: Family Medicine

## 2017-05-16 MED ORDER — LISINOPRIL 10 MG PO TABS: 10.0000 mg | ORAL_TABLET | Freq: Every day | ORAL | 3 refills | Status: DC

## 2017-05-16 NOTE — Telephone Encounter (Signed)
Please advise due to [place], RMA

## 2017-05-16 NOTE — Telephone Encounter (Signed)
Lisinopril 10mg

## 2017-05-16 NOTE — Telephone Encounter (Signed)
Patient is on Losartan and wants to switch meds or do what you think is best for patient.    She uses [facility]

## 2017-05-16 NOTE — Telephone Encounter (Signed)
Pt advised, RX sent in-Melanie Diaz, RMA

## 2017-05-31 DIAGNOSIS — E113413 Type 2 diabetes mellitus with severe nonproliferative diabetic retinopathy with macular edema, bilateral: Secondary | ICD-10-CM | POA: Diagnosis not present

## 2017-05-31 LAB — HM DIABETES EYE EXAM

## 2017-06-03 DIAGNOSIS — E113413 Type 2 diabetes mellitus with severe nonproliferative diabetic retinopathy with macular edema, bilateral: Secondary | ICD-10-CM | POA: Diagnosis not present

## 2017-06-13 ENCOUNTER — Encounter: Payer: Self-pay | Admitting: Family Medicine

## 2017-07-01 ENCOUNTER — Other Ambulatory Visit: Payer: Self-pay | Admitting: Emergency Medicine

## 2017-07-01 ENCOUNTER — Telehealth: Payer: Self-pay | Admitting: Family Medicine

## 2017-07-01 DIAGNOSIS — E139 Other specified diabetes mellitus without complications: Secondary | ICD-10-CM

## 2017-07-01 DIAGNOSIS — I10 Essential (primary) hypertension: Secondary | ICD-10-CM

## 2017-07-01 DIAGNOSIS — E7849 Other hyperlipidemia: Secondary | ICD-10-CM

## 2017-07-01 NOTE — Progress Notes (Signed)
Reprinted lab slip for pt and for [facility] instead of [facility]. She will come before her appt since she was suppose to come 02/2017

## 2017-07-09 DIAGNOSIS — E113413 Type 2 diabetes mellitus with severe nonproliferative diabetic retinopathy with macular edema, bilateral: Secondary | ICD-10-CM | POA: Diagnosis not present

## 2017-08-06 ENCOUNTER — Ambulatory Visit: Payer: Medicare Other

## 2017-08-06 ENCOUNTER — Encounter: Payer: Medicare Other | Admitting: Family Medicine

## 2017-08-09 ENCOUNTER — Telehealth: Payer: Self-pay

## 2017-08-09 NOTE — Telephone Encounter (Signed)
LMTCB and rescheduled previously cancelled AWV and CPE. -MM

## 2017-08-10 ENCOUNTER — Inpatient Hospital Stay: Payer: Medicare Other

## 2017-08-10 ENCOUNTER — Other Ambulatory Visit: Payer: Self-pay

## 2017-08-10 ENCOUNTER — Encounter: Payer: Self-pay | Admitting: Internal Medicine

## 2017-08-10 ENCOUNTER — Emergency Department: Payer: Medicare Other

## 2017-08-10 ENCOUNTER — Inpatient Hospital Stay
Admission: EM | Admit: 2017-08-10 | Discharge: 2017-08-14 | DRG: 065 | Disposition: A | Discharge status: Skilled Nursing Facility | Payer: Medicare Other | Attending: Internal Medicine | Admitting: Internal Medicine

## 2017-08-10 DIAGNOSIS — I639 Cerebral infarction, unspecified: Secondary | ICD-10-CM

## 2017-08-10 DIAGNOSIS — I651 Occlusion and stenosis of basilar artery: Secondary | ICD-10-CM | POA: Diagnosis present

## 2017-08-10 DIAGNOSIS — I10 Essential (primary) hypertension: Secondary | ICD-10-CM | POA: Diagnosis present

## 2017-08-10 DIAGNOSIS — Z880 Allergy status to penicillin: Secondary | ICD-10-CM | POA: Diagnosis not present

## 2017-08-10 DIAGNOSIS — I6523 Occlusion and stenosis of bilateral carotid arteries: Secondary | ICD-10-CM | POA: Diagnosis present

## 2017-08-10 DIAGNOSIS — H8149 Vertigo of central origin, unspecified ear: Secondary | ICD-10-CM | POA: Diagnosis present

## 2017-08-10 DIAGNOSIS — R609 Edema, unspecified: Secondary | ICD-10-CM

## 2017-08-10 DIAGNOSIS — I6502 Occlusion and stenosis of left vertebral artery: Secondary | ICD-10-CM | POA: Diagnosis not present

## 2017-08-10 DIAGNOSIS — E876 Hypokalemia: Secondary | ICD-10-CM | POA: Diagnosis present

## 2017-08-10 DIAGNOSIS — F329 Major depressive disorder, single episode, unspecified: Secondary | ICD-10-CM | POA: Diagnosis present

## 2017-08-10 DIAGNOSIS — Z86718 Personal history of other venous thrombosis and embolism: Secondary | ICD-10-CM | POA: Diagnosis not present

## 2017-08-10 DIAGNOSIS — Z89611 Acquired absence of right leg above knee: Secondary | ICD-10-CM

## 2017-08-10 DIAGNOSIS — M6281 Muscle weakness (generalized): Secondary | ICD-10-CM | POA: Diagnosis not present

## 2017-08-10 DIAGNOSIS — E119 Type 2 diabetes mellitus without complications: Secondary | ICD-10-CM | POA: Diagnosis not present

## 2017-08-10 DIAGNOSIS — D72829 Elevated white blood cell count, unspecified: Secondary | ICD-10-CM | POA: Diagnosis present

## 2017-08-10 DIAGNOSIS — Z7901 Long term (current) use of anticoagulants: Secondary | ICD-10-CM | POA: Diagnosis not present

## 2017-08-10 DIAGNOSIS — R112 Nausea with vomiting, unspecified: Secondary | ICD-10-CM | POA: Diagnosis present

## 2017-08-10 DIAGNOSIS — I69398 Other sequelae of cerebral infarction: Secondary | ICD-10-CM | POA: Diagnosis not present

## 2017-08-10 DIAGNOSIS — R29700 NIHSS score 0: Secondary | ICD-10-CM | POA: Diagnosis not present

## 2017-08-10 DIAGNOSIS — Z792 Long term (current) use of antibiotics: Secondary | ICD-10-CM

## 2017-08-10 DIAGNOSIS — E785 Hyperlipidemia, unspecified: Secondary | ICD-10-CM | POA: Diagnosis present

## 2017-08-10 DIAGNOSIS — I63542 Cerebral infarction due to unspecified occlusion or stenosis of left cerebellar artery: Secondary | ICD-10-CM | POA: Diagnosis not present

## 2017-08-10 DIAGNOSIS — Z881 Allergy status to other antibiotic agents status: Secondary | ICD-10-CM | POA: Diagnosis not present

## 2017-08-10 DIAGNOSIS — I63233 Cerebral infarction due to unspecified occlusion or stenosis of bilateral carotid arteries: Secondary | ICD-10-CM | POA: Diagnosis not present

## 2017-08-10 DIAGNOSIS — Z7984 Long term (current) use of oral hypoglycemic drugs: Secondary | ICD-10-CM

## 2017-08-10 DIAGNOSIS — R42 Dizziness and giddiness: Secondary | ICD-10-CM | POA: Diagnosis not present

## 2017-08-10 DIAGNOSIS — Z79899 Other long term (current) drug therapy: Secondary | ICD-10-CM | POA: Diagnosis not present

## 2017-08-10 DIAGNOSIS — Z89511 Acquired absence of right leg below knee: Secondary | ICD-10-CM

## 2017-08-10 DIAGNOSIS — Z7401 Bed confinement status: Secondary | ICD-10-CM | POA: Diagnosis not present

## 2017-08-10 DIAGNOSIS — I82511 Chronic embolism and thrombosis of right femoral vein: Secondary | ICD-10-CM | POA: Diagnosis present

## 2017-08-10 DIAGNOSIS — M7989 Other specified soft tissue disorders: Secondary | ICD-10-CM | POA: Diagnosis not present

## 2017-08-10 DIAGNOSIS — R402413 Glasgow coma scale score 13-15, at hospital admission: Secondary | ICD-10-CM | POA: Diagnosis not present

## 2017-08-10 DIAGNOSIS — E860 Dehydration: Secondary | ICD-10-CM | POA: Diagnosis not present

## 2017-08-10 DIAGNOSIS — E789 Disorder of lipoprotein metabolism, unspecified: Secondary | ICD-10-CM | POA: Diagnosis not present

## 2017-08-10 DIAGNOSIS — I6389 Other cerebral infarction: Secondary | ICD-10-CM | POA: Diagnosis not present

## 2017-08-10 DIAGNOSIS — I672 Cerebral atherosclerosis: Secondary | ICD-10-CM | POA: Diagnosis not present

## 2017-08-10 DIAGNOSIS — Z7982 Long term (current) use of aspirin: Secondary | ICD-10-CM | POA: Diagnosis not present

## 2017-08-10 LAB — HEMOGLOBIN A1C
Hgb A1c MFr Bld: 7 % — ABNORMAL HIGH (ref 4.8–5.6)
MEAN PLASMA GLUCOSE: 154.2 mg/dL

## 2017-08-10 LAB — CBC WITH DIFFERENTIAL/PLATELET
BASOS ABS: 0 10*3/uL (ref 0–0.1)
Basophils Relative: 0 %
EOS PCT: 0 %
Eosinophils Absolute: 0 10*3/uL (ref 0–0.7)
HEMATOCRIT: 30.3 % — AB (ref 35.0–47.0)
Hemoglobin: 10 g/dL — ABNORMAL LOW (ref 12.0–16.0)
LYMPHS ABS: 1.1 10*3/uL (ref 1.0–3.6)
LYMPHS PCT: 9 %
MCH: 26.6 pg (ref 26.0–34.0)
MCHC: 32.9 g/dL (ref 32.0–36.0)
MCV: 80.9 fL (ref 80.0–100.0)
MONO ABS: 1.1 10*3/uL — AB (ref 0.2–0.9)
Monocytes Relative: 8 %
NEUTROS ABS: 10.6 10*3/uL — AB (ref 1.4–6.5)
Neutrophils Relative %: 83 %
PLATELETS: 415 10*3/uL (ref 150–440)
RBC: 3.75 MIL/uL — AB (ref 3.80–5.20)
RDW: 14.2 % (ref 11.5–14.5)
WBC: 12.9 10*3/uL — AB (ref 3.6–11.0)

## 2017-08-10 LAB — COMPREHENSIVE METABOLIC PANEL
ALT: 17 U/L (ref 14–54)
AST: 25 U/L (ref 15–41)
Albumin: 2.6 g/dL — ABNORMAL LOW (ref 3.5–5.0)
Alkaline Phosphatase: 86 U/L (ref 38–126)
Anion gap: 16 — ABNORMAL HIGH (ref 5–15)
BUN: 37 mg/dL — AB (ref 6–20)
CALCIUM: 8.4 mg/dL — AB (ref 8.9–10.3)
CHLORIDE: 102 mmol/L (ref 101–111)
CO2: 17 mmol/L — ABNORMAL LOW (ref 22–32)
CREATININE: 1.4 mg/dL — AB (ref 0.44–1.00)
GFR, EST AFRICAN AMERICAN: 40 mL/min — AB (ref >60)
GFR, EST NON AFRICAN AMERICAN: 34 mL/min — AB (ref >60)
Glucose, Bld: 239 mg/dL — ABNORMAL HIGH (ref 65–99)
Potassium: 3 mmol/L — ABNORMAL LOW (ref 3.5–5.1)
Sodium: 135 mmol/L (ref 135–145)
TOTAL PROTEIN: 6.2 g/dL — AB (ref 6.5–8.1)
Total Bilirubin: 0.4 mg/dL (ref 0.3–1.2)

## 2017-08-10 LAB — LIPID PANEL
CHOLESTEROL: 345 mg/dL — AB (ref 0–200)
HDL: 44 mg/dL (ref >40)
LDL Cholesterol: 258 mg/dL — ABNORMAL HIGH (ref 0–99)
Total CHOL/HDL Ratio: 7.8 RATIO
Triglycerides: 214 mg/dL — ABNORMAL HIGH (ref <150)
VLDL: 43 mg/dL — ABNORMAL HIGH (ref 0–40)

## 2017-08-10 LAB — GLUCOSE, CAPILLARY
Glucose-Capillary: 134 mg/dL — ABNORMAL HIGH (ref 65–99)
Glucose-Capillary: 138 mg/dL — ABNORMAL HIGH (ref 65–99)

## 2017-08-10 LAB — TSH: TSH: 3.682 u[IU]/mL (ref 0.350–4.500)

## 2017-08-10 LAB — TROPONIN I

## 2017-08-10 MED ORDER — ACETAMINOPHEN 650 MG RE SUPP: 650.0000 mg | Freq: Four times a day (QID) | RECTAL | Status: DC | PRN

## 2017-08-10 MED ORDER — ONDANSETRON HCL 4 MG/2ML IJ SOLN
4.0000 mg | Freq: Once | INTRAMUSCULAR | Status: AC
  Administered 2017-08-10: 4 mg via INTRAVENOUS
  Filled 2017-08-10: qty 2

## 2017-08-10 MED ORDER — INSULIN ASPART 100 UNIT/ML ~~LOC~~ SOLN
0.0000 [IU] | Freq: Three times a day (TID) | SUBCUTANEOUS | Status: DC
  Administered 2017-08-10: 17:00:00 1 [IU] via SUBCUTANEOUS
  Administered 2017-08-11 (×2): 2 [IU] via SUBCUTANEOUS
  Administered 2017-08-12: 1 [IU] via SUBCUTANEOUS
  Administered 2017-08-12: 09:00:00 2 [IU] via SUBCUTANEOUS
  Administered 2017-08-13: 1 [IU] via SUBCUTANEOUS
  Administered 2017-08-13: 2 [IU] via SUBCUTANEOUS
  Administered 2017-08-14: 08:00:00 1 [IU] via SUBCUTANEOUS
  Filled 2017-08-10 (×8): qty 1

## 2017-08-10 MED ORDER — INSULIN ASPART 100 UNIT/ML ~~LOC~~ SOLN: 0.0000 [IU] | Freq: Every day | SUBCUTANEOUS | Status: DC

## 2017-08-10 MED ORDER — MIRTAZAPINE 15 MG PO TABS
30.0000 mg | ORAL_TABLET | Freq: Every day | ORAL | Status: DC
  Administered 2017-08-10 – 2017-08-13 (×3): 30 mg via ORAL
  Filled 2017-08-10 (×3): qty 2

## 2017-08-10 MED ORDER — ACETAMINOPHEN 325 MG PO TABS
650.0000 mg | ORAL_TABLET | Freq: Four times a day (QID) | ORAL | Status: DC | PRN
  Filled 2017-08-10: qty 2

## 2017-08-10 MED ORDER — DIAZEPAM 5 MG PO TABS
5.0000 mg | ORAL_TABLET | Freq: Once | ORAL | Status: AC
  Administered 2017-08-10: 5 mg via ORAL
  Filled 2017-08-10: qty 1

## 2017-08-10 MED ORDER — SODIUM CHLORIDE 0.9 % IV SOLN
Freq: Once | INTRAVENOUS | Status: AC
  Administered 2017-08-10: 13:00:00 via INTRAVENOUS

## 2017-08-10 MED ORDER — LISINOPRIL 10 MG PO TABS
10.0000 mg | ORAL_TABLET | Freq: Every day | ORAL | Status: DC
  Filled 2017-08-10 (×2): qty 1

## 2017-08-10 MED ORDER — METFORMIN HCL 500 MG PO TABS: 1000.0000 mg | ORAL_TABLET | Freq: Every day | ORAL | Status: DC

## 2017-08-10 MED ORDER — KCL IN DEXTROSE-NACL 20-5-0.9 MEQ/L-%-% IV SOLN
INTRAVENOUS | Status: DC
  Administered 2017-08-10: 23:00:00 via INTRAVENOUS
  Filled 2017-08-10 (×3): qty 1000

## 2017-08-10 MED ORDER — MECLIZINE HCL 25 MG PO TABS
50.0000 mg | ORAL_TABLET | Freq: Once | ORAL | Status: AC
  Administered 2017-08-10: 50 mg via ORAL
  Filled 2017-08-10: qty 2

## 2017-08-10 MED ORDER — KETOROLAC TROMETHAMINE 30 MG/ML IJ SOLN
15.0000 mg | Freq: Once | INTRAMUSCULAR | Status: AC
  Administered 2017-08-10: 23:00:00 15 mg via INTRAVENOUS
  Filled 2017-08-10: qty 1

## 2017-08-10 MED ORDER — ONDANSETRON HCL 4 MG PO TABS: 4.0000 mg | ORAL_TABLET | Freq: Four times a day (QID) | ORAL | Status: DC | PRN

## 2017-08-10 MED ORDER — INSULIN ASPART 100 UNIT/ML ~~LOC~~ SOLN
5.0000 [IU] | Freq: Once | SUBCUTANEOUS | Status: AC
  Administered 2017-08-10: 5 [IU] via SUBCUTANEOUS
  Filled 2017-08-10: qty 1

## 2017-08-10 MED ORDER — ENOXAPARIN SODIUM 30 MG/0.3ML ~~LOC~~ SOLN
30.0000 mg | SUBCUTANEOUS | Status: DC
  Administered 2017-08-10: 17:00:00 30 mg via SUBCUTANEOUS
  Filled 2017-08-10 (×2): qty 0.3

## 2017-08-10 MED ORDER — AMLODIPINE BESYLATE 10 MG PO TABS
10.0000 mg | ORAL_TABLET | Freq: Every day | ORAL | Status: DC
  Filled 2017-08-10 (×2): qty 1

## 2017-08-10 MED ORDER — METOPROLOL TARTRATE 50 MG PO TABS
50.0000 mg | ORAL_TABLET | Freq: Two times a day (BID) | ORAL | Status: DC
  Administered 2017-08-10 – 2017-08-14 (×6): 50 mg via ORAL
  Filled 2017-08-10 (×7): qty 1

## 2017-08-10 MED ORDER — ASPIRIN 81 MG PO CHEW
324.0000 mg | CHEWABLE_TABLET | Freq: Once | ORAL | Status: AC
  Administered 2017-08-10: 324 mg via ORAL
  Filled 2017-08-10: qty 4

## 2017-08-10 MED ORDER — ATORVASTATIN CALCIUM 20 MG PO TABS
40.0000 mg | ORAL_TABLET | Freq: Every day | ORAL | Status: DC
  Administered 2017-08-12 – 2017-08-13 (×2): 40 mg via ORAL
  Filled 2017-08-10 (×4): qty 2

## 2017-08-10 MED ORDER — ONDANSETRON HCL 4 MG/2ML IJ SOLN
4.0000 mg | Freq: Four times a day (QID) | INTRAMUSCULAR | Status: DC | PRN
  Administered 2017-08-11 – 2017-08-13 (×4): 4 mg via INTRAVENOUS
  Filled 2017-08-10 (×5): qty 2

## 2017-08-10 MED ORDER — POTASSIUM CHLORIDE CRYS ER 20 MEQ PO TBCR
40.0000 meq | EXTENDED_RELEASE_TABLET | ORAL | Status: AC
  Filled 2017-08-10 (×2): qty 2

## 2017-08-10 MED ORDER — ASPIRIN EC 81 MG PO TBEC
81.0000 mg | DELAYED_RELEASE_TABLET | Freq: Every day | ORAL | Status: DC
  Filled 2017-08-10: qty 1

## 2017-08-10 MED ORDER — GLIPIZIDE 10 MG PO TABS
10.0000 mg | ORAL_TABLET | Freq: Every day | ORAL | Status: DC
  Filled 2017-08-10 (×2): qty 1

## 2017-08-10 NOTE — ED Provider Notes (Signed)
[facility] Emergency Department Provider Note       Time seen: ----------------------------------------- 1:01 PM on 08/10/2017 -----------------------------------------   I have reviewed the triage vital signs and the nursing notes.  HISTORY   Chief Complaint Dizziness and Emesis    HPI Melanie Diaz is a 84 y.o. female with a history of depression, diabetes, DVT, hyperlipidemia and hypertension who presents to the ED for dizziness that she describes as a room spinning sensation or like that she is on the ocean.  She has had associated nausea and vomiting.  She had some epigastric discomfort.  She was given Zofran in route by EMS with no improvement.  She has had significant in her ear issues in the past according to family.  She has also had fevers and chills.  Past Medical History:  Diagnosis Date  . Depression   . Diabetes mellitus without complication ([place])   . History of deep vein thrombosis    about 30 years ago  . Hyperlipidemia 07/06/2005  . Hypertension     Patient Active Problem List   Diagnosis Date Noted  . Chronic anticoagulation 12/03/2016  . DVT (deep venous thrombosis) ([place]) 11/22/2016  . Hypertension 12/20/2014  . Allergic rhinitis 11/16/2014  . Cannot sleep 11/16/2014  . Peripheral blood vessel disorder ([place]) 11/16/2014  . Phantom limb syndrome ([place]) 11/16/2014  . Reactive depression (situational) 11/16/2014  . Amputation of lower limb ([place]) 11/16/2014  . Diabetes mellitus, type 2 ([place]) 11/16/2014  . Amputation of leg, traumatic ([place]) 06/18/2012  . 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 Gangrene of foot ([place]) 02/20/2012  . DM (diabetes mellitus), secondary ([place]) 07/06/2005  . HLD (hyperlipidemia) 07/06/2005    Past Surgical History:  Procedure Laterality Date  . LEG AMPUTATION Right     Allergies Amoxicillin and Azithromycin  Social History Social History   Tobacco Use  . Smoking status: Never  Smoker  . Smokeless tobacco: Never Used  Substance Use Topics  . Alcohol use: No  . Drug use: No   Review of Systems Constitutional: Negative for fever. Cardiovascular: Negative for chest pain. Respiratory: Negative for shortness of breath. Gastrointestinal: Negative for abdominal pain, positive for vomiting Musculoskeletal: Negative for back pain. Skin: Negative for rash. Neurological: Negative for headaches, focal weakness or numbness.  Positive for vertigo  All systems negative/normal/unremarkable except as stated in the HPI  ____________________________________________   PHYSICAL EXAM:  VITAL SIGNS: ED Triage Vitals  Enc Vitals Group     BP 08/10/17 1254 (!) 154/75     Pulse Rate 08/10/17 1254 76     Resp 08/10/17 1254 18     Temp 08/10/17 1254 98.2 F (36.8 C)     Temp Source 08/10/17 1254 Oral     SpO2 08/10/17 1252 100 %     Weight 08/10/17 1256 130 lb 3.2 oz (59.1 kg)     Height --      Head Circumference --      Peak Flow --      Pain Score 08/10/17 1255 0     Pain Loc --      Pain Edu? --      Excl. in [place]? --    Constitutional: Alert and oriented. Well appearing and in no distress. Eyes: Conjunctivae are normal. Normal extraocular movements. ENT   Head: Normocephalic and atraumatic.   Nose: No congestion/rhinnorhea.   Mouth/Throat: Mucous membranes are moist.   Neck: No stridor. Cardiovascular: Normal rate, regular rhythm. No  murmurs, rubs, or gallops. Respiratory: Normal respiratory effort without tachypnea nor retractions. Breath sounds are clear and equal bilaterally. No wheezes/rales/rhonchi. Gastrointestinal: Soft and nontender. Normal bowel sounds Musculoskeletal: Nontender with normal range of motion in extremities. No lower extremity tenderness nor edema. Neurologic:  Normal speech and language. No gross focal neurologic deficits are appreciated.  Skin:  Skin is warm, dry and intact. No rash noted. Psychiatric: Mood and affect are  normal. Speech and behavior are normal.  ____________________________________________  EKG: Interpreted by me.  Sinus rhythm rate 76 bpm, normal PR interval, wide QRS, LVH with repolarization abnormality, left axis deviation  ____________________________________________  ED COURSE:  As part of my medical decision making, I reviewed the following data within the [place] History obtained from family if available, nursing notes, old chart and ekg, as well as notes from prior ED visits. Patient presented for vertigo symptoms, we will assess with labs and imaging as indicated at this time.   Procedures ____________________________________________   LABS (pertinent positives/negatives)  Labs Reviewed  CBC WITH DIFFERENTIAL/PLATELET - Abnormal; Notable for the following components:      Result Value   WBC 12.9 (*)    RBC 3.75 (*)    Hemoglobin 10.0 (*)    HCT 30.3 (*)    Neutro Abs 10.6 (*)    Monocytes Absolute 1.1 (*)    All other components within normal limits  COMPREHENSIVE METABOLIC PANEL - Abnormal; Notable for the following components:   Potassium 3.0 (*)    CO2 17 (*)    Glucose, Bld 239 (*)    BUN 37 (*)    Creatinine, Ser 1.40 (*)    Calcium 8.4 (*)    Total Protein 6.2 (*)    Albumin 2.6 (*)    GFR calc non Af Amer 34 (*)    GFR calc Af Amer 40 (*)    Anion gap 16 (*)    All other components within normal limits  TROPONIN I  URINALYSIS, COMPLETE (UACMP) WITH MICROSCOPIC    RADIOLOGY Images were viewed by me  CT head IMPRESSION: Left cerebellar low density is noted concerning for infarction of indeterminate age. MRI is recommended for further evaluation.  ____________________________________________  DIFFERENTIAL DIAGNOSIS   Peripheral vertigo, central vertigo, dehydration, electrolyte abnormality, occult infection  FINAL ASSESSMENT AND PLAN  Cerebellar infarction   Plan: The patient had presented for symptoms of vertigo with  nausea and vomiting. Patient's labs revealed mild leukocytosis and likely dehydration for which she was given IV fluids.  She also received meclizine and Valium without significant improvement in her symptoms.  Patient's imaging was concerning for a cerebellar infarction which would explain her symptoms.  She does not take anticoagulation at this point so she was placed on aspirin, I will order an MRI.  She is stable for admission at this time.   Laurence Aly, MD   Note: This note was generated in part or whole with voice recognition software. Voice recognition is usually quite accurate but there are transcription errors that can and very often do occur. I apologize for any typographical errors that were not detected and corrected.     Earleen Newport, MD 08/10/17 1515

## 2017-08-10 NOTE — H&P (Addendum)
[place] [facility] [place] NAME: Melanie Diaz    MR#:  [id number]  DATE OF BIRTH:  [birth year]  DATE OF ADMISSION:  08/10/2017  PRIMARY CARE PHYSICIAN: [name]., MD   REQUESTING/REFERRING PHYSICIAN: Dr. Lenise Arena  CHIEF COMPLAINT:   Chief Complaint  Patient presents with  . Dizziness  . Emesis    HISTORY OF PRESENT ILLNESS:  Melanie Diaz  is a 84 y.o. female with a known history of DVT who was on Coumadin, hypertension, non-insulin-dependent diabetes mellitus presents from home secondary to worsening dizziness. Patient is a poor historian. She says that her symptoms started about 5 days ago, she started feeling dizzy whenever she is trying to get out of bed. She laid there for a while and her symptoms passed and she felt better.Since then on and off she's been having dizzy spells associated with nausea. She does not ambulate at baseline due to her right leg amputation and uses a wheelchair. Symptoms started again last night, did not resolve and were much worse and so brought to the emergency room today. CT showing a possible left cerebellar infarct.  PAST MEDICAL HISTORY:   Past Medical History:  Diagnosis Date  . Depression   . Diabetes mellitus without complication ([place])   . History of deep vein thrombosis    about 30 years ago  . Hyperlipidemia 07/06/2005  . Hypertension     PAST SURGICAL HISTORY:   Past Surgical History:  Procedure Laterality Date  . LEG AMPUTATION Right     SOCIAL HISTORY:   Social History   Tobacco Use  . Smoking status: Never Smoker  . Smokeless tobacco: Never Used  Substance Use Topics  . Alcohol use: No    FAMILY HISTORY:   Family History  Problem Relation Age of Onset  . Kidney failure Mother   . Heart attack Sister        Hx of MI  . Diabetes Brother        borderline diabetes  . Cancer Maternal Aunt        Ovarian cancer    DRUG ALLERGIES:   Allergies    Allergen Reactions  . Amoxicillin Other (See Comments)  . Azithromycin     vomiting and stomach upset    REVIEW OF SYSTEMS:   Review of Systems  Constitutional: Positive for malaise/fatigue. Negative for chills, fever and weight loss.  HENT: Negative for ear discharge, ear pain, hearing loss and nosebleeds.   Eyes: Negative for blurred vision, double vision and photophobia.  Respiratory: Negative for cough, hemoptysis, shortness of breath and wheezing.   Cardiovascular: Negative for chest pain, palpitations, orthopnea and leg swelling.  Gastrointestinal: Positive for nausea and vomiting. Negative for abdominal pain, constipation, diarrhea, heartburn and melena.  Genitourinary: Negative for dysuria, frequency, hematuria and urgency.  Musculoskeletal: Negative for back pain, myalgias and neck pain.  Skin: Negative for rash.  Neurological: Positive for dizziness. Negative for tremors, sensory change, speech change, focal weakness and headaches.  Endo/Heme/Allergies: Does not bruise/bleed easily.  Psychiatric/Behavioral: Negative for depression.    MEDICATIONS AT HOME:   Prior to Admission medications   Medication Sig Start Date End Date Taking? Authorizing Provider  amLODipine (NORVASC) 10 MG tablet Take 1 tablet (10 mg total) by mouth daily. 03/05/17   [name]., MD  apixaban (ELIQUIS) 5 MG TABS tablet Take 1 tablet (5 mg total) by mouth 2 (two) times daily. 12/12/16   Charlaine Dalton  R, MD  Cetirizine HCl (ZYRTEC ALLERGY) 10 MG CAPS Take 1 capsule by mouth daily.  12/16/13   [provider]  doxycycline (VIBRA-TABS) 100 MG tablet Take 1 tablet (100 mg total) by mouth 2 (two) times daily. Patient not taking: Reported on 03/05/2017 12/04/16   Gardiner Barefoot, DPM  doxycycline (VIBRAMYCIN) 100 MG capsule Take 1 capsule (100 mg total) by mouth 2 (two) times daily. 12/05/16   Gardiner Barefoot, DPM  gabapentin (NEURONTIN) 300 MG capsule TAKE (1) CAPSULE TWICE DAILY AS  NEEDED. 09/10/16   [name]., MD  glipiZIDE (GLUCOTROL) 10 MG tablet Take 1 tablet (10 mg total) by mouth daily. 08/07/16   [name]., MD  glipiZIDE (GLUCOTROL) 10 MG tablet Take 1 tablet (10 mg total) by mouth daily. 12/24/16   [name]., MD  hydrochlorothiazide (HYDRODIURIL) 25 MG tablet Take 1 tablet (25 mg total) by mouth daily. 02/07/17   [name]., MD  lisinopril (PRINIVIL,ZESTRIL) 10 MG tablet Take 1 tablet (10 mg total) by mouth daily. 05/16/17   [name]., MD  metFORMIN (GLUCOPHAGE) 500 MG tablet 2 tablets daily 12/17/16   [name]., MD  metoprolol (LOPRESSOR) 50 MG tablet Take 1 tablet (50 mg total) by mouth 2 (two) times daily. 03/19/16   [name]., MD  metoprolol tartrate (LOPRESSOR) 50 MG tablet Take 1 tablet (50 mg total) by mouth 2 (two) times daily. 12/24/16   [name]., MD  mirtazapine (REMERON) 30 MG tablet Take 1 tablet (30 mg total) by mouth at bedtime. 09/10/16   [name]., MD  ondansetron (ZOFRAN) 4 MG tablet Take 1 tablet (4 mg total) by mouth every 8 (eight) hours as needed for nausea or vomiting. 11/19/16   Trinna Post, PA-C      VITAL SIGNS:  Blood pressure (!) 178/98, pulse 67, temperature 98.2 F (36.8 C), temperature source Oral, resp. rate 15, weight 59.1 kg (130 lb 3.2 oz), SpO2 100 %.  PHYSICAL EXAMINATION:   Physical Exam  GENERAL:  84 y.o.-year-old patient lying in the bed with no acute distress.  EYES: Pupils equal, round, reactive to light and accommodation. No scleral icterus. Extraocular muscles intact.  HEENT: Head atraumatic, normocephalic. Oropharynx and nasopharynx clear.  NECK:  Supple, no jugular venous distention. No thyroid enlargement, no tenderness.  LUNGS: Normal breath sounds bilaterally, no wheezing, rales,rhonchi or crepitation. No use of accessory muscles of respiration. Decreased bibasilar breath sounds CARDIOVASCULAR: S1, S2  normal. No  rubs, or gallops. 2/6 systolic murmur present ABDOMEN: Soft, nontender, nondistended. Bowel sounds present. No organomegaly or mass.  EXTREMITIES: s/p right leg BKA, No pedal edema, cyanosis, or clubbing.  NEUROLOGIC: Cranial nerves II through XII are intact. No nystagmus. Muscle strength 5/5 in all extremities. Sensation intact. Gait not checked.  PSYCHIATRIC: The patient is alert and oriented x 3.  SKIN: No obvious rash, lesion, or ulcer.   LABORATORY PANEL:   CBC Recent Labs  Lab 08/10/17 1304  WBC 12.9*  HGB 10.0*  HCT 30.3*  PLT 415   ------------------------------------------------------------------------------------------------------------------  Chemistries  Recent Labs  Lab 08/10/17 1304  NA 135  K 3.0*  CL 102  CO2 17*  GLUCOSE 239*  BUN 37*  CREATININE 1.40*  CALCIUM 8.4*  AST 25  ALT 17  ALKPHOS 86  BILITOT 0.4   ------------------------------------------------------------------------------------------------------------------  Cardiac Enzymes Recent Labs  Lab 08/10/17 1304  TROPONINI <0.03   ------------------------------------------------------------------------------------------------------------------  RADIOLOGY:  Ct Head Wo Contrast  Result Date: 08/10/2017 CLINICAL DATA:  Dizziness, nausea. EXAM: CT HEAD WITHOUT CONTRAST TECHNIQUE: Contiguous axial images were obtained from the base of the skull through the vertex without intravenous contrast. COMPARISON:  None. FINDINGS: Brain: Mild chronic ischemic white matter disease. Low density is noted in left cerebellar hemisphere concerning for infarction of indeterminate age. No mass effect or midline shift is noted. Ventricular size is within normal limits. No hemorrhage is noted. Vascular: No hyperdense vessel or unexpected calcification. Skull: Normal. Negative for fracture or focal lesion. Sinuses/Orbits: No acute finding. Other: None. IMPRESSION: Left cerebellar low density is noted  concerning for infarction of indeterminate age. MRI is recommended for further evaluation. Electronically Signed   By: Marijo Conception, M.D.   On: 08/10/2017 15:01    EKG:   Orders placed or performed during the hospital encounter of 08/10/17  . ED EKG  . ED EKG    IMPRESSION AND PLAN:   Melanie Diaz  is a 84 y.o. female with a known history of DVT who was on Coumadin, hypertension, non-insulin-dependent diabetes mellitus presents from home secondary to worsening dizziness.  1. Acute CVA- left cerebellar infarct, presenting as dizziness - admit, MRI brain, MRA and carotids, ECHO - not on any anticoagulation at home- start aspirin and statin - Neuro checks, neuro consult -PT, OT and speech therapy  2. History of DVT-patient was on warfarin for a long time and that was discontinued at the end of 2018. She had a repeat ultrasound done and was noted to have a left popliteal occlusive DVT which was new, eliquis was called in. However according to daughter patient has not been taking eliquis or any anticoagulation since then. -Repeat Dopplers of lower extremities ordered. If positive clot seen, eliquis can be started.  3. Diabetes mellitus-on glipizide, metformin and sliding scale insulin for now -Check A1c  4. Hypertension-on Norvasc, metoprolol and lisinopril  5.DVT prophylaxis-on Lovenox  6. Hypokalemia- being replaced   All the records are reviewed and case discussed with ED provider. Management plans discussed with the patient, family and they are in agreement.  CODE STATUS: Full Code  TOTAL TIME TAKING CARE OF THIS PATIENT: 50 minutes.    Toan Mort M.D on 08/10/2017 at 3:45 PM  Between 7am to 6pm - Pager - [phone]  After 6pm go to [email or web address] - password [email or web address]  Office  [phone]  CC: Primary care physician; [name]., MD

## 2017-08-10 NOTE — ED Triage Notes (Signed)
Pt arrives via ems from home, pt has been having intermittent episodes of dizziness, nausea and vomiting. Pt reporting some epigastric discomfort at this time from drinking a  Protein flavored water pta. Pt was given 4mg  of zofran via ems

## 2017-08-11 ENCOUNTER — Inpatient Hospital Stay: Admit: 2017-08-11 | Payer: Medicare Other

## 2017-08-11 ENCOUNTER — Inpatient Hospital Stay
Admit: 2017-08-11 | Discharge: 2017-08-11 | Disposition: A | Discharge status: Home / Self Care | Payer: Medicare Other | Attending: Internal Medicine | Admitting: Internal Medicine

## 2017-08-11 DIAGNOSIS — I639 Cerebral infarction, unspecified: Secondary | ICD-10-CM

## 2017-08-11 LAB — URINALYSIS, COMPLETE (UACMP) WITH MICROSCOPIC
BACTERIA UA: NONE SEEN
BILIRUBIN URINE: NEGATIVE
Glucose, UA: >=500 mg/dL — AB
HGB URINE DIPSTICK: NEGATIVE
KETONES UR: NEGATIVE mg/dL
Leukocytes, UA: NEGATIVE
Nitrite: NEGATIVE
SQUAMOUS EPITHELIAL / LPF: NONE SEEN
Specific Gravity, Urine: 1.014 (ref 1.005–1.030)
pH: 7 (ref 5.0–8.0)

## 2017-08-11 LAB — CBC
HCT: 27.6 % — ABNORMAL LOW (ref 35.0–47.0)
Hemoglobin: 9.1 g/dL — ABNORMAL LOW (ref 12.0–16.0)
MCH: 26.4 pg (ref 26.0–34.0)
MCHC: 32.8 g/dL (ref 32.0–36.0)
MCV: 80.3 fL (ref 80.0–100.0)
PLATELETS: 330 10*3/uL (ref 150–440)
RBC: 3.43 MIL/uL — ABNORMAL LOW (ref 3.80–5.20)
RDW: 14.4 % (ref 11.5–14.5)
WBC: 12.7 10*3/uL — AB (ref 3.6–11.0)

## 2017-08-11 LAB — GLUCOSE, CAPILLARY
GLUCOSE-CAPILLARY: 188 mg/dL — AB (ref 65–99)
GLUCOSE-CAPILLARY: 192 mg/dL — AB (ref 65–99)
GLUCOSE-CAPILLARY: 137 mg/dL — AB (ref 65–99)
Glucose-Capillary: 160 mg/dL — ABNORMAL HIGH (ref 65–99)
Glucose-Capillary: 117 mg/dL — ABNORMAL HIGH (ref 65–99)

## 2017-08-11 LAB — BASIC METABOLIC PANEL
Anion gap: 8 (ref 5–15)
BUN: 33 mg/dL — AB (ref 6–20)
CALCIUM: 7.5 mg/dL — AB (ref 8.9–10.3)
CO2: 21 mmol/L — ABNORMAL LOW (ref 22–32)
Chloride: 109 mmol/L (ref 101–111)
Creatinine, Ser: 1.3 mg/dL — ABNORMAL HIGH (ref 0.44–1.00)
GFR calc Af Amer: 43 mL/min — ABNORMAL LOW (ref >60)
GFR, EST NON AFRICAN AMERICAN: 37 mL/min — AB (ref >60)
GLUCOSE: 166 mg/dL — AB (ref 65–99)
POTASSIUM: 3.3 mmol/L — AB (ref 3.5–5.1)
Sodium: 138 mmol/L (ref 135–145)

## 2017-08-11 LAB — PROTIME-INR
INR: 1.01
PROTHROMBIN TIME: 13.2 s (ref 11.4–15.2)

## 2017-08-11 MED ORDER — ASPIRIN EC 325 MG PO TBEC
325.0000 mg | DELAYED_RELEASE_TABLET | Freq: Every day | ORAL | Status: DC
  Administered 2017-08-12 – 2017-08-14 (×3): 325 mg via ORAL
  Filled 2017-08-11 (×3): qty 1

## 2017-08-11 MED ORDER — APIXABAN 5 MG PO TABS: 5.0000 mg | ORAL_TABLET | Freq: Two times a day (BID) | ORAL | Status: DC

## 2017-08-11 MED ORDER — PROMETHAZINE HCL 25 MG/ML IJ SOLN
12.5000 mg | Freq: Four times a day (QID) | INTRAMUSCULAR | Status: DC | PRN
  Administered 2017-08-11 – 2017-08-12 (×4): 12.5 mg via INTRAVENOUS
  Filled 2017-08-11 (×4): qty 1

## 2017-08-11 MED ORDER — POTASSIUM CHLORIDE 10 MEQ/100ML IV SOLN
10.0000 meq | INTRAVENOUS | Status: AC
  Administered 2017-08-11 (×3): 10 meq via INTRAVENOUS
  Filled 2017-08-11 (×2): qty 100

## 2017-08-11 MED ORDER — MECLIZINE HCL 12.5 MG PO TABS
12.5000 mg | ORAL_TABLET | Freq: Three times a day (TID) | ORAL | Status: DC | PRN
  Filled 2017-08-11: qty 1

## 2017-08-11 MED ORDER — APIXABAN 5 MG PO TABS: 10.0000 mg | ORAL_TABLET | Freq: Two times a day (BID) | ORAL | Status: DC

## 2017-08-11 MED ORDER — WARFARIN - PHARMACIST DOSING INPATIENT
Freq: Every day | Status: DC
  Administered 2017-08-11: 19:00:00

## 2017-08-11 MED ORDER — ENOXAPARIN SODIUM 40 MG/0.4ML ~~LOC~~ SOLN
40.0000 mg | SUBCUTANEOUS | Status: AC
  Administered 2017-08-11: 18:00:00 40 mg via SUBCUTANEOUS
  Filled 2017-08-11: qty 0.4

## 2017-08-11 MED ORDER — HYDRALAZINE HCL 20 MG/ML IJ SOLN
10.0000 mg | Freq: Once | INTRAMUSCULAR | Status: DC
  Filled 2017-08-11: qty 1

## 2017-08-11 MED ORDER — WARFARIN SODIUM 7.5 MG PO TABS
7.5000 mg | ORAL_TABLET | Freq: Once | ORAL | Status: DC
  Filled 2017-08-11: qty 1

## 2017-08-11 MED ORDER — SODIUM CHLORIDE 0.9 % IV SOLN
INTRAVENOUS | Status: AC
  Administered 2017-08-11 – 2017-08-12 (×2): via INTRAVENOUS

## 2017-08-11 MED ORDER — HYDRALAZINE HCL 20 MG/ML IJ SOLN: 10.0000 mg | Freq: Four times a day (QID) | INTRAMUSCULAR | Status: DC | PRN

## 2017-08-11 NOTE — Progress Notes (Addendum)
ANTICOAGULATION CONSULT NOTE - Initial Consult  Pharmacy Consult for warfarin Indication: DVT  Allergies  Allergen Reactions  . Amoxicillin Other (See Comments)    Has patient had a PCN reaction causing immediate rash, facial/tongue/throat swelling, SOB or lightheadedness with hypotension: Unknown Has patient had a PCN reaction causing severe rash involving mucus membranes or skin necrosis: Unknown Has patient had a PCN reaction that required hospitalization: Unknown Has patient had a PCN reaction occurring within the last 10 years: Unknown If all of the above answers are "NO", then may proceed with Cephalosporin use.   . Azithromycin     vomiting and stomach upset    Patient Measurements: Height: 5\' 6"  (167.6 cm) Weight: 130 lb 3.2 oz (59.1 kg) IBW/kg (Calculated) : 59.3  Vital Signs: Temp: 100 F (37.8 C) (03/31 1419) Temp Source: Oral (03/31 1419) BP: 179/62 (03/31 1419) Pulse Rate: 61 (03/31 1419)  Labs: Recent Labs    08/10/17 1304 08/11/17 0421 08/11/17 1228  HGB 10.0* 9.1*  --   HCT 30.3* 27.6*  --   PLT 415 330  --   LABPROT  --   --  13.2  INR  --   --  1.01  CREATININE 1.40* 1.30*  --   TROPONINI <0.03  --   --     Estimated Creatinine Clearance: 31.7 mL/min (A) (by C-G formula based on SCr of 1.3 mg/dL (H)).   Medical History: Past Medical History:  Diagnosis Date  . Depression   . Diabetes mellitus without complication ([place])   . History of deep vein thrombosis    about 30 years ago  . Hyperlipidemia 07/06/2005  . Hypertension     Medications:  Medications Prior to Admission  Medication Sig Dispense Refill Last Dose  . amLODipine (NORVASC) 10 MG tablet Take 1 tablet (10 mg total) by mouth daily. (Patient taking differently: Take 5 mg by mouth daily. ) 30 tablet 5 08/09/2017 at 0800  . Cetirizine HCl (ZYRTEC ALLERGY) 10 MG CAPS Take 1 capsule by mouth daily.    PRN at PRN  . gabapentin (NEURONTIN) 300 MG capsule TAKE (1) CAPSULE TWICE DAILY AS  NEEDED. 180 capsule 0 08/09/2017 at Unknown time  . glipiZIDE (GLUCOTROL) 10 MG tablet Take 1 tablet (10 mg total) by mouth daily. 30 tablet 5 08/09/2017 at 0800  . hydrochlorothiazide (HYDRODIURIL) 25 MG tablet Take 1 tablet (25 mg total) by mouth daily. 90 tablet 3 08/09/2017 at Unknown time  . lisinopril (PRINIVIL,ZESTRIL) 10 MG tablet Take 1 tablet (10 mg total) by mouth daily. 90 tablet 3 08/09/2017 at Unknown time  . metFORMIN (GLUCOPHAGE) 500 MG tablet 2 tablets daily 180 tablet 2 08/09/2017 at 1200  . metoprolol (LOPRESSOR) 50 MG tablet Take 1 tablet (50 mg total) by mouth 2 (two) times daily. 180 tablet 1 08/09/2017 at 2000  . apixaban (ELIQUIS) 5 MG TABS tablet Take 1 tablet (5 mg total) by mouth 2 (two) times daily. (Patient not taking: Reported on 08/10/2017) 60 tablet 0 Not Taking at Unknown time  . doxycycline (VIBRA-TABS) 100 MG tablet Take 1 tablet (100 mg total) by mouth 2 (two) times daily. (Patient not taking: Reported on 03/05/2017) 20 tablet 0 Completed Course at Unknown time  . doxycycline (VIBRAMYCIN) 100 MG capsule Take 1 capsule (100 mg total) by mouth 2 (two) times daily. (Patient not taking: Reported on 08/10/2017) 20 capsule 0 Not Taking at Unknown time  . glipiZIDE (GLUCOTROL) 10 MG tablet Take 1 tablet (10 mg total) by mouth  daily. (Patient not taking: Reported on 08/10/2017) 90 tablet 0 Not Taking at Unknown time  . metoprolol tartrate (LOPRESSOR) 50 MG tablet Take 1 tablet (50 mg total) by mouth 2 (two) times daily. (Patient not taking: Reported on 08/10/2017) 60 tablet 5 Not Taking at Unknown time  . mirtazapine (REMERON) 30 MG tablet Take 1 tablet (30 mg total) by mouth at bedtime. (Patient not taking: Reported on 08/10/2017) 90 tablet 0 Not Taking at Unknown time  . ondansetron (ZOFRAN) 4 MG tablet Take 1 tablet (4 mg total) by mouth every 8 (eight) hours as needed for nausea or vomiting. (Patient not taking: Reported on 08/10/2017) 20 tablet 0 Completed Course at Unknown time    Scheduled:  . [START ON 08/12/2017] aspirin EC  325 mg Oral Daily  . atorvastatin  40 mg Oral q1800  . enoxaparin (LOVENOX) injection  40 mg Subcutaneous Q24H  . hydrALAZINE  10 mg Intravenous Once  . insulin aspart  0-5 Units Subcutaneous QHS  . insulin aspart  0-9 Units Subcutaneous TID WC  . metoprolol tartrate  50 mg Oral BID  . mirtazapine  30 mg Oral QHS   Infusions:  . sodium chloride 75 mL/hr at 08/11/17 1346  . potassium chloride 10 mEq (08/11/17 1517)   PRN: acetaminophen **OR** acetaminophen, hydrALAZINE, meclizine, ondansetron **OR** ondansetron (ZOFRAN) IV, promethazine Anti-infectives (From admission, onward)   None      Assessment: 84 year old female requiring VTE treatment with Warfarin   Goal of Therapy:  INR 2-3 Monitor platelets by anticoagulation protocol: Yes   Plan:  Warfarin 7.5mg  po x 1 dose tonight. INR ordered with AM labs, baseline INR 1.01   Donna Christen Melodi Happel 08/11/2017,3:31 PM

## 2017-08-11 NOTE — Progress Notes (Signed)
Notified Dr. Benjie Karvonen of BP191/59, received verbal order for hydralazine prn, patient complaining of nausea, administered zofran prn, BP  188/58, spoke with Dr. Benjie Karvonen in regards to diastolic, ordered to wait for hydralazine.

## 2017-08-11 NOTE — Progress Notes (Signed)
[facility] [place] NAME: Melanie Diaz    MR#:  [id number]  DATE OF BIRTH:  [birth year]  SUBJECTIVE:   Patient with nausea this morning  REVIEW OF SYSTEMS:    Review of Systems  Constitutional: Negative for fever, chills weight loss HENT: Negative for ear pain, nosebleeds, congestion, facial swelling, rhinorrhea, neck pain, neck stiffness and ear discharge.   Respiratory: Negative for cough, shortness of breath, wheezing  Cardiovascular: Negative for chest pain, palpitations and leg swelling.  Gastrointestinal: Negative for heartburn, abdominal pain, vomiting, diarrhea or consitpation positive nausea Genitourinary: Negative for dysuria, urgency, frequency, hematuria Musculoskeletal: Negative for back pain or joint pain Neurological: Negative for dizziness, seizures, syncope, focal weakness,  numbness and headaches.  Hematological: Does not bruise/bleed easily.  Psychiatric/Behavioral: Negative for hallucinations, confusion, dysphoric mood    Tolerating Diet: Due to nausea she is not eating      DRUG ALLERGIES:   Allergies  Allergen Reactions  . Amoxicillin Other (See Comments)    Has patient had a PCN reaction causing immediate rash, facial/tongue/throat swelling, SOB or lightheadedness with hypotension: Unknown Has patient had a PCN reaction causing severe rash involving mucus membranes or skin necrosis: Unknown Has patient had a PCN reaction that required hospitalization: Unknown Has patient had a PCN reaction occurring within the last 10 years: Unknown If all of the above answers are "NO", then may proceed with Cephalosporin use.   . Azithromycin     vomiting and stomach upset    VITALS:  Blood pressure (!) 188/58, pulse 60, temperature 99 F (37.2 C), temperature source Oral, resp. rate 16, height 5\' 6"  (1.676 m), weight 59.1 kg (130 lb 3.2 oz), SpO2 99 %.  PHYSICAL EXAMINATION:  Constitutional: Appears well-developed  and well-nourished. No distress. HENT: Normocephalic. Marland Kitchen Oropharynx is clear and moist.  Eyes: Conjunctivae and EOM are normal. PERRLA, no scleral icterus.  Neck: Normal ROM. Neck supple. No JVD. No tracheal deviation. CVS: RRR, S1/S2 +, no murmurs, no gallops, no carotid bruit.  Pulmonary: Effort and breath sounds normal, no stridor, rhonchi, wheezes, rales.  Abdominal: Soft. BS +,  no distension, tenderness, rebound or guarding.  Musculoskeletal: Normal range of motion. No edema and no tenderness.  Neuro: Alert. CN 2-12 grossly intact. No focal deficits. Skin: Skin is warm and dry. No rash noted. Psychiatric: Normal mood and affect.      LABORATORY PANEL:   CBC Recent Labs  Lab 08/11/17 0421  WBC 12.7*  HGB 9.1*  HCT 27.6*  PLT 330   ------------------------------------------------------------------------------------------------------------------  Chemistries  Recent Labs  Lab 08/10/17 1304 08/11/17 0421  NA 135 138  K 3.0* 3.3*  CL 102 109  CO2 17* 21*  GLUCOSE 239* 166*  BUN 37* 33*  CREATININE 1.40* 1.30*  CALCIUM 8.4* 7.5*  AST 25  --   ALT 17  --   ALKPHOS 86  --   BILITOT 0.4  --    ------------------------------------------------------------------------------------------------------------------  Cardiac Enzymes Recent Labs  Lab 08/10/17 1304  TROPONINI <0.03   ------------------------------------------------------------------------------------------------------------------  RADIOLOGY:  Ct Head Wo Contrast  Result Date: 08/10/2017 CLINICAL DATA:  Dizziness, nausea. EXAM: CT HEAD WITHOUT CONTRAST TECHNIQUE: Contiguous axial images were obtained from the base of the skull through the vertex without intravenous contrast. COMPARISON:  None. FINDINGS: Brain: Mild chronic ischemic white matter disease. Low density is noted in left cerebellar hemisphere concerning for infarction of indeterminate age. No mass effect or midline shift is noted. Ventricular size  is within normal limits. No hemorrhage is noted. Vascular: No hyperdense vessel or unexpected calcification. Skull: Normal. Negative for fracture or focal lesion. Sinuses/Orbits: No acute finding. Other: None. IMPRESSION: Left cerebellar low density is noted concerning for infarction of indeterminate age. MRI is recommended for further evaluation. Electronically Signed   By: Marijo Conception, M.D.   On: 08/10/2017 15:01   Mr Jodene Nam Head Wo Contrast  Result Date: 08/10/2017 CLINICAL DATA:  Dizziness. EXAM: MRI HEAD WITHOUT CONTRAST MRA HEAD WITHOUT CONTRAST TECHNIQUE: Multiplanar, multiecho pulse sequences of the brain and surrounding structures were obtained without intravenous contrast. Angiographic images of the head were obtained using MRA technique without contrast. COMPARISON:  Head CT 08/10/2017 FINDINGS: MRI HEAD FINDINGS Brain: There is moderate volume patchy restricted diffusion and T2 hyperintensity in the left cerebellar hemisphere extending into the vermis consistent with acute to early subacute infarct. No intracranial hemorrhage, mass, midline shift, or extra-axial fluid collection is identified. There is no significant posterior fossa mass effect. Scattered small foci of T2 hyperintensity in the cerebral white matter are nonspecific but compatible with mild chronic small vessel ischemic disease. Cerebral atrophy is mild-to-moderate and most notable in the perisylvian regions. Vascular: There is an abnormal appearance of the distal left vertebral artery. Other major intracranial vascular flow voids are preserved. Skull and upper cervical spine: No suspicious marrow lesion. Sinuses/Orbits: Bilateral cataract extraction. Paranasal sinuses and mastoid air cells are clear. Other: 1.7 cm cyst lateral to the posterior alveolar ridge of the right maxilla. MRA HEAD FINDINGS The visualized distal right vertebral artery is patent and supplies the basilar. A small amount of flow related enhancement is present in  the left V3 segment, however the more distal left V3 and V4 segments are occluded. Patent right PICA, bilateral AICA, and bilateral SCA origins are identified. There is evidence of a severe left SCA origin stenosis. The basilar artery is patent with a moderate stenosis just distal to the AICA origins. There is a small left posterior communicating artery. PCAs are patent with mild proximal left P2 and severe distal left P2 stenoses, and there is mild asymmetric attenuation of distal left PCA branch vessels compared to the right. No significant proximal right PCA stenosis is identified. The internal carotid arteries are patent from skull base to carotid termini with atherosclerotic siphon irregularity bilaterally. ICA stenosis at the level of the anterior genu is moderate on the right and mild on the left. ACAs and MCAs are patent without evidence of proximal branch occlusion. There is mild mid right M1 stenosis, and there also likely mild proximal right A1 and bilateral distal A1 stenoses. No aneurysm is identified. IMPRESSION: 1. Acute to early subacute left cerebellar infarcts. 2. Mild chronic small vessel ischemic disease. 3. 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 4. Intracranial atherosclerosis including moderate basilar artery stenosis, severe left SCA stenosis, and moderate right and mild left ICA stenosis. Electronically Signed   By: Logan Bores M.D.   On: 08/10/2017 16:46   Mr Brain Wo Contrast  Result Date: 08/10/2017 CLINICAL DATA:  Dizziness. EXAM: MRI HEAD WITHOUT CONTRAST MRA HEAD WITHOUT CONTRAST TECHNIQUE: Multiplanar, multiecho pulse sequences of the brain and surrounding structures were obtained without intravenous contrast. Angiographic images of the head were obtained using MRA technique without contrast. COMPARISON:  Head CT 08/10/2017 FINDINGS: MRI HEAD FINDINGS Brain: There is moderate volume patchy restricted diffusion and T2 hyperintensity in the left cerebellar hemisphere  extending into the vermis consistent with acute to early subacute infarct. No  intracranial hemorrhage, mass, midline shift, or extra-axial fluid collection is identified. There is no significant posterior fossa mass effect. Scattered small foci of T2 hyperintensity in the cerebral white matter are nonspecific but compatible with mild chronic small vessel ischemic disease. Cerebral atrophy is mild-to-moderate and most notable in the perisylvian regions. Vascular: There is an abnormal appearance of the distal left vertebral artery. Other major intracranial vascular flow voids are preserved. Skull and upper cervical spine: No suspicious marrow lesion. Sinuses/Orbits: Bilateral cataract extraction. Paranasal sinuses and mastoid air cells are clear. Other: 1.7 cm cyst lateral to the posterior alveolar ridge of the right maxilla. MRA HEAD FINDINGS The visualized distal right vertebral artery is patent and supplies the basilar. A small amount of flow related enhancement is present in the left V3 segment, however the more distal left V3 and V4 segments are occluded. Patent right PICA, bilateral AICA, and bilateral SCA origins are identified. There is evidence of a severe left SCA origin stenosis. The basilar artery is patent with a moderate stenosis just distal to the AICA origins. There is a small left posterior communicating artery. PCAs are patent with mild proximal left P2 and severe distal left P2 stenoses, and there is mild asymmetric attenuation of distal left PCA branch vessels compared to the right. No significant proximal right PCA stenosis is identified. The internal carotid arteries are patent from skull base to carotid termini with atherosclerotic siphon irregularity bilaterally. ICA stenosis at the level of the anterior genu is moderate on the right and mild on the left. ACAs and MCAs are patent without evidence of proximal branch occlusion. There is mild mid right M1 stenosis, and there also likely mild  proximal right A1 and bilateral distal A1 stenoses. No aneurysm is identified. IMPRESSION: 1. Acute to early subacute left cerebellar infarcts. 2. Mild chronic small vessel ischemic disease. 3. 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 4. Intracranial atherosclerosis including moderate basilar artery stenosis, severe left SCA stenosis, and moderate right and mild left ICA stenosis. Electronically Signed   By: Logan Bores M.D.   On: 08/10/2017 16:46   US Carotid Bilateral  Result Date: 08/11/2017 CLINICAL DATA:  Hypertension, stroke, hyperlipidemia, diabetes EXAM: BILATERAL CAROTID DUPLEX ULTRASOUND TECHNIQUE: Pearline Cables scale imaging, color Doppler and duplex ultrasound were performed of bilateral carotid and vertebral arteries in the neck. COMPARISON:  None. FINDINGS: Criteria: Quantification of carotid stenosis is based on velocity parameters that correlate the residual internal carotid diameter with NASCET-based stenosis levels, using the diameter of the distal internal carotid lumen as the denominator for stenosis measurement. The following velocity measurements were obtained: RIGHT ICA:  171/18 cm/sec CCA:  676/7 cm/sec SYSTOLIC ICA/CCA RATIO:  1.7 DIASTOLIC ICA/CCA RATIO:  2.3 ECA:  188 cm/sec LEFT ICA:  149/23 cm/sec CCA:  20/94 cm/sec SYSTOLIC ICA/CCA RATIO:  1.7 DIASTOLIC ICA/CCA RATIO:  1.9 ECA:  163 cm/sec RIGHT CAROTID ARTERY: Moderate heterogeneous diffuse right carotid bifurcation atherosclerosis. This results in proximal right ICA luminal narrowing by grayscale imaging. In this region there is mild velocity elevation measuring 171/18 centimeters/second. Mild turbulent flow. Degree of stenosis estimated at 50-69% by ultrasound criteria. RIGHT VERTEBRAL ARTERY:  Antegrade LEFT CAROTID ARTERY: Moderate heterogeneous calcified left carotid bifurcation atherosclerosis. Proximal ICA luminal narrowing by grayscale imaging. Left ICA mild velocity elevation up to 149/23 centimeters/second. Degree of  stenosis also estimated at 50-69%. LEFT VERTEBRAL ARTERY:  Antegrade IMPRESSION: Moderate bilateral ICA stenoses estimated at 50-69% by ultrasound criteria. Patent antegrade vertebral flow bilaterally Electronically Signed  By: Eugenie Filler M.D.   On: 08/11/2017 09:24   US Venous Img Lower Bilateral  Result Date: 08/10/2017 CLINICAL DATA:  Bilateral lower extremity swelling. EXAM: BILATERAL LOWER EXTREMITY VENOUS DOPPLER ULTRASOUND TECHNIQUE: Gray-scale sonography with graded compression, as well as color Doppler and duplex ultrasound were performed to evaluate the lower extremity deep venous systems from the level of the common femoral vein and including the common femoral, femoral, profunda femoral, popliteal and calf veins including the posterior tibial, peroneal and gastrocnemius veins when visible. The superficial great saphenous vein was also interrogated. Spectral Doppler was utilized to evaluate flow at rest and with distal augmentation maneuvers in the common femoral, femoral and popliteal veins. COMPARISON:  Ultrasound of December 12, 2016. FINDINGS: RIGHT LOWER EXTREMITY Common Femoral Vein: Partially compressible with some flow present. Echogenic mural thrombus is noted most consistent with old DVT. Saphenofemoral Junction: Partially compressible with some flow present. Findings most consistent with chronic DVT. Profunda Femoral Vein: Partially compressible with some flow present. Findings most consistent with chronic DVT. Femoral Vein: No evidence of thrombus. Normal compressibility, respiratory phasicity and response to augmentation. Popliteal Vein: No evidence of thrombus. Normal compressibility, respiratory phasicity and response to augmentation. Calf Veins: Status post right below-knee amputation. Venous Reflux:  None. Other Findings:  None. LEFT LOWER EXTREMITY Common Femoral Vein: No evidence of thrombus. Normal compressibility, respiratory phasicity and response to augmentation. Saphenofemoral  Junction: No evidence of thrombus. Normal compressibility and flow on color Doppler imaging. Profunda Femoral Vein: No evidence of thrombus. Normal compressibility and flow on color Doppler imaging. Femoral Vein: No evidence of thrombus. Normal compressibility, respiratory phasicity and response to augmentation. Popliteal Vein: No evidence of thrombus. Normal compressibility, respiratory phasicity and response to augmentation. Calf Veins: No evidence of thrombus. Normal compressibility and flow on color Doppler imaging. Superficial Great Saphenous Vein: No evidence of thrombus. Normal compressibility. Venous Reflux:  None. Other Findings:  None. IMPRESSION: Findings most consistent with recannulized chronic deep venous thrombosis in the right common femoral vein, right saphenofemoral junction and right profunda femoral vein. No evidence of deep venous thrombosis seen in left lower extremity. Electronically Signed   By: Marijo Conception, M.D.   On: 08/10/2017 19:01     ASSESSMENT AND PLAN:   84 year old female with history of DVT who is on Coumadin and did not tolerate Eliquis, diabetes and essential hypertension who presented with dizziness.  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 Supportive management for nausea Allow permissive hypertension  2.Cannulized chronic deep venous thrombosis in the right common femoral vein, right saphenofemoral junction and right profunda femoral vein Patient will restart Coumadin tomorrow if okay  with neurology  3.  Diabetes: Continue sliding scale Hold oral medications for now as patient has decreased oral intake due to nausea  4.  Essential hypertension: Patient may continue metoprolol however other blood pressure medications discontinued for today due to acute CVA and permissive hypertension  5.  Depression: Continue Remeron  6.  Hypokalemia: Replete and recheck in a.m.  Management plans discussed with the patient and she is in agreement.  CODE STATUS: full  TOTAL TIME  TAKING CARE OF THIS PATIENT: 30 minutes.     POSSIBLE D/C 1-2 days, DEPENDING ON CLINICAL CONDITION.   Rafay Dahan M.D on 08/11/2017 at 11:51 AM  Between 7am to 6pm - Pager - [phone] After 6pm go to [email or web address] - password [email or web address] [facility]  Office  [phone]  CC: Primary care physician; [name]., MD  Note: This dictation was prepared with Dragon dictation along with smaller phrase technology. Any transcriptional errors that result from this process are unintentional.

## 2017-08-11 NOTE — Plan of Care (Signed)
Patient continues with nausea, PRN zofran administered. Patient noted with no urine output for 7 hours. Bladder palpated and not distended. Patient assisted on bedside commode and voided 700cc. Endorsed.

## 2017-08-11 NOTE — Progress Notes (Signed)
*  PRELIMINARY RESULTS* Echocardiogram 2D Echocardiogram has been performed. Bubble Study performed.  Lavell Luster Korin Hartwell 08/11/2017, 3:04 PM

## 2017-08-11 NOTE — Consult Note (Signed)
Reason for Consult: vertigo  Referring Physician: Dr. Benjie Karvonen    CC: Vertigo   HPI: Melanie Diaz is an 84 y.o. female with a known history of DVT who was on Coumadin until about December, hypertension, non-insulin-dependent diabetes mellitus presents from home secondary to worsening dizziness for the past 5 days that is non positional associated with nausea. She does not ambulate at baseline due to her right leg amputation and uses a wheelchair. Symptoms started again last night, did not resolve and were much worse and so brought to the emergency room today.    Past Medical History:  Diagnosis Date  . Depression   . Diabetes mellitus without complication ([place])   . History of deep vein thrombosis    about 30 years ago  . Hyperlipidemia 07/06/2005  . Hypertension     Past Surgical History:  Procedure Laterality Date  . LEG AMPUTATION Right     Family History  Problem Relation Age of Onset  . Kidney failure Mother   . Heart attack Sister        Hx of MI  . Diabetes Brother        borderline diabetes  . Cancer Maternal Aunt        Ovarian cancer    Social History:  reports that she has never smoked. She has never used smokeless tobacco. She reports that she does not drink alcohol or use drugs.  Allergies  Allergen Reactions  . Amoxicillin Other (See Comments)    Has patient had a PCN reaction causing immediate rash, facial/tongue/throat swelling, SOB or lightheadedness with hypotension: Unknown Has patient had a PCN reaction causing severe rash involving mucus membranes or skin necrosis: Unknown Has patient had a PCN reaction that required hospitalization: Unknown Has patient had a PCN reaction occurring within the last 10 years: Unknown If all of the above answers are "NO", then may proceed with Cephalosporin use.   . Azithromycin     vomiting and stomach upset    Medications: I have reviewed the patient's current medications.  ROS: History obtained from the  patient  General ROS: negative for - chills, fatigue, fever, night sweats, weight gain or weight loss Psychological ROS: negative for - behavioral disorder, hallucinations, memory difficulties, mood swings or suicidal ideation Ophthalmic ROS: negative for - blurry vision, double vision, eye pain or loss of vision ENT ROS: negative for - epistaxis, nasal discharge, oral lesions, sore throat, tinnitus or vertigo Allergy and Immunology ROS: negative for - hives or itchy/watery eyes Hematological and Lymphatic ROS: negative for - bleeding problems, bruising or swollen lymph nodes Endocrine ROS: negative for - galactorrhea, hair pattern changes, polydipsia/polyuria or temperature intolerance Respiratory ROS: negative for - cough, hemoptysis, shortness of breath or wheezing Cardiovascular ROS: negative for - chest pain, dyspnea on exertion, edema or irregular heartbeat Gastrointestinal ROS: negative for - abdominal pain, diarrhea, hematemesis, nausea/vomiting or stool incontinence Genito-Urinary ROS: negative for - dysuria, hematuria, incontinence or urinary frequency/urgency Musculoskeletal ROS: negative for - joint swelling or muscular weakness Neurological ROS: as noted in HPI Dermatological ROS: negative for rash and skin lesion changes  Physical Examination: Blood pressure (!) 188/58, pulse 60, temperature 99 F (37.2 C), temperature source Oral, resp. rate 16, height 5\' 6"  (1.676 m), weight 130 lb 3.2 oz (59.1 kg), SpO2 99 %.    Neurological Examination   Mental Status: Alert, oriented, thought content appropriate.  Speech fluent without evidence of aphasia.  Able to follow 3 step commands without difficulty. Cranial Nerves:  II: Discs flat bilaterally; Visual fields grossly normal, pupils equal, round, reactive to light and accommodation III,IV, VI: ptosis not present, extra-ocular motions intact bilaterally V,VII: smile symmetric, facial light touch sensation normal bilaterally VIII:  hearing normal bilaterally IX,X: gag reflex present XI: bilateral shoulder shrug XII: midline tongue extension Motor: Amputation otherwise 5/5  Tone and bulk:normal tone throughout; no atrophy noted Sensory: Pinprick and light touch intact throughout, bilaterally Deep Tendon Reflexes: 1+ and symmetric throughout Plantars: Right: downgoing   Left: downgoing Cerebellar: Dysmetria on L sided Gait: not tested      Laboratory Studies:   Basic Metabolic Panel: Recent Labs  Lab 08/10/17 1304 08/11/17 0421  NA 135 138  K 3.0* 3.3*  CL 102 109  CO2 17* 21*  GLUCOSE 239* 166*  BUN 37* 33*  CREATININE 1.40* 1.30*  CALCIUM 8.4* 7.5*    Liver Function Tests: Recent Labs  Lab 08/10/17 1304  AST 25  ALT 17  ALKPHOS 86  BILITOT 0.4  PROT 6.2*  ALBUMIN 2.6*   No results for input(s): LIPASE, AMYLASE in the last 168 hours. No results for input(s): AMMONIA in the last 168 hours.  CBC: Recent Labs  Lab 08/10/17 1304 08/11/17 0421  WBC 12.9* 12.7*  NEUTROABS 10.6*  --   HGB 10.0* 9.1*  HCT 30.3* 27.6*  MCV 80.9 80.3  PLT 415 330    Cardiac Enzymes: Recent Labs  Lab 08/10/17 1304  TROPONINI <0.03    BNP: Invalid input(s): POCBNP  CBG: Recent Labs  Lab 08/10/17 1643 08/10/17 2003 08/11/17 0747 08/11/17 0916 08/11/17 1158  GLUCAP 134* 138* 188* 192* 160*    Microbiology: No results found for this or any previous visit.  Coagulation Studies: Recent Labs    08/11/17 1228  LABPROT 13.2  INR 1.01    Urinalysis:  Recent Labs  Lab 08/11/17 0700  COLORURINE YELLOW*  LABSPEC 1.014  PHURINE 7.0  GLUCOSEU >=500*  HGBUR NEGATIVE  BILIRUBINUR NEGATIVE  KETONESUR NEGATIVE  PROTEINUR >=300*  NITRITE NEGATIVE  LEUKOCYTESUR NEGATIVE    Lipid Panel:     Component Value Date/Time   CHOL 345 (H) 08/10/2017 1304   CHOL 262 (H) 12/20/2014 1511   TRIG 214 (H) 08/10/2017 1304   HDL 44 08/10/2017 1304   HDL 36 (L) 12/20/2014 1511   CHOLHDL 7.8  08/10/2017 1304   VLDL 43 (H) 08/10/2017 1304   LDLCALC 258 (H) 08/10/2017 1304   LDLCALC 164 (H) 12/20/2014 1511    HgbA1C:  Lab Results  Component Value Date   HGBA1C 7.0 (H) 08/10/2017    Urine Drug Screen:  No results found for: LABOPIA, COCAINSCRNUR, LABBENZ, AMPHETMU, THCU, LABBARB  Alcohol Level: No results for input(s): ETH in the last 168 hours.  Other results: EKG: normal EKG, normal sinus rhythm, unchanged from previous tracings.  Imaging: Ct Head Wo Contrast  Result Date: 08/10/2017 CLINICAL DATA:  Dizziness, nausea. EXAM: CT HEAD WITHOUT CONTRAST TECHNIQUE: Contiguous axial images were obtained from the base of the skull through the vertex without intravenous contrast. COMPARISON:  None. FINDINGS: Brain: Mild chronic ischemic white matter disease. Low density is noted in left cerebellar hemisphere concerning for infarction of indeterminate age. No mass effect or midline shift is noted. Ventricular size is within normal limits. No hemorrhage is noted. Vascular: No hyperdense vessel or unexpected calcification. Skull: Normal. Negative for fracture or focal lesion. Sinuses/Orbits: No acute finding. Other: None. IMPRESSION: Left cerebellar low density is noted concerning for infarction of indeterminate age. MRI is  recommended for further evaluation. Electronically Signed   By: Marijo Conception, M.D.   On: 08/10/2017 15:01   Mr Jodene Nam Head Wo Contrast  Result Date: 08/10/2017 CLINICAL DATA:  Dizziness. EXAM: MRI HEAD WITHOUT CONTRAST MRA HEAD WITHOUT CONTRAST TECHNIQUE: Multiplanar, multiecho pulse sequences of the brain and surrounding structures were obtained without intravenous contrast. Angiographic images of the head were obtained using MRA technique without contrast. COMPARISON:  Head CT 08/10/2017 FINDINGS: MRI HEAD FINDINGS Brain: There is moderate volume patchy restricted diffusion and T2 hyperintensity in the left cerebellar hemisphere extending into the vermis consistent with  acute to early subacute infarct. No intracranial hemorrhage, mass, midline shift, or extra-axial fluid collection is identified. There is no significant posterior fossa mass effect. Scattered small foci of T2 hyperintensity in the cerebral white matter are nonspecific but compatible with mild chronic small vessel ischemic disease. Cerebral atrophy is mild-to-moderate and most notable in the perisylvian regions. Vascular: There is an abnormal appearance of the distal left vertebral artery. Other major intracranial vascular flow voids are preserved. Skull and upper cervical spine: No suspicious marrow lesion. Sinuses/Orbits: Bilateral cataract extraction. Paranasal sinuses and mastoid air cells are clear. Other: 1.7 cm cyst lateral to the posterior alveolar ridge of the right maxilla. MRA HEAD FINDINGS The visualized distal right vertebral artery is patent and supplies the basilar. A small amount of flow related enhancement is present in the left V3 segment, however the more distal left V3 and V4 segments are occluded. Patent right PICA, bilateral AICA, and bilateral SCA origins are identified. There is evidence of a severe left SCA origin stenosis. The basilar artery is patent with a moderate stenosis just distal to the AICA origins. There is a small left posterior communicating artery. PCAs are patent with mild proximal left P2 and severe distal left P2 stenoses, and there is mild asymmetric attenuation of distal left PCA branch vessels compared to the right. No significant proximal right PCA stenosis is identified. The internal carotid arteries are patent from skull base to carotid termini with atherosclerotic siphon irregularity bilaterally. ICA stenosis at the level of the anterior genu is moderate on the right and mild on the left. ACAs and MCAs are patent without evidence of proximal branch occlusion. There is mild mid right M1 stenosis, and there also likely mild proximal right A1 and bilateral distal A1  stenoses. No aneurysm is identified. IMPRESSION: 1. Acute to early subacute left cerebellar infarcts. 2. Mild chronic small vessel ischemic disease. 3. 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 4. Intracranial atherosclerosis including moderate basilar artery stenosis, severe left SCA stenosis, and moderate right and mild left ICA stenosis. Electronically Signed   By: Logan Bores M.D.   On: 08/10/2017 16:46   Mr Brain Wo Contrast  Result Date: 08/10/2017 CLINICAL DATA:  Dizziness. EXAM: MRI HEAD WITHOUT CONTRAST MRA HEAD WITHOUT CONTRAST TECHNIQUE: Multiplanar, multiecho pulse sequences of the brain and surrounding structures were obtained without intravenous contrast. Angiographic images of the head were obtained using MRA technique without contrast. COMPARISON:  Head CT 08/10/2017 FINDINGS: MRI HEAD FINDINGS Brain: There is moderate volume patchy restricted diffusion and T2 hyperintensity in the left cerebellar hemisphere extending into the vermis consistent with acute to early subacute infarct. No intracranial hemorrhage, mass, midline shift, or extra-axial fluid collection is identified. There is no significant posterior fossa mass effect. Scattered small foci of T2 hyperintensity in the cerebral white matter are nonspecific but compatible with mild chronic small vessel ischemic disease. Cerebral atrophy is  mild-to-moderate and most notable in the perisylvian regions. Vascular: There is an abnormal appearance of the distal left vertebral artery. Other major intracranial vascular flow voids are preserved. Skull and upper cervical spine: No suspicious marrow lesion. Sinuses/Orbits: Bilateral cataract extraction. Paranasal sinuses and mastoid air cells are clear. Other: 1.7 cm cyst lateral to the posterior alveolar ridge of the right maxilla. MRA HEAD FINDINGS The visualized distal right vertebral artery is patent and supplies the basilar. A small amount of flow related enhancement is present in the left  V3 segment, however the more distal left V3 and V4 segments are occluded. Patent right PICA, bilateral AICA, and bilateral SCA origins are identified. There is evidence of a severe left SCA origin stenosis. The basilar artery is patent with a moderate stenosis just distal to the AICA origins. There is a small left posterior communicating artery. PCAs are patent with mild proximal left P2 and severe distal left P2 stenoses, and there is mild asymmetric attenuation of distal left PCA branch vessels compared to the right. No significant proximal right PCA stenosis is identified. The internal carotid arteries are patent from skull base to carotid termini with atherosclerotic siphon irregularity bilaterally. ICA stenosis at the level of the anterior genu is moderate on the right and mild on the left. ACAs and MCAs are patent without evidence of proximal branch occlusion. There is mild mid right M1 stenosis, and there also likely mild proximal right A1 and bilateral distal A1 stenoses. No aneurysm is identified. IMPRESSION: 1. Acute to early subacute left cerebellar infarcts. 2. Mild chronic small vessel ischemic disease. 3. 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 4. Intracranial atherosclerosis including moderate basilar artery stenosis, severe left SCA stenosis, and moderate right and mild left ICA stenosis. Electronically Signed   By: Logan Bores M.D.   On: 08/10/2017 16:46   US Carotid Bilateral  Result Date: 08/11/2017 CLINICAL DATA:  Hypertension, stroke, hyperlipidemia, diabetes EXAM: BILATERAL CAROTID DUPLEX ULTRASOUND TECHNIQUE: Pearline Cables scale imaging, color Doppler and duplex ultrasound were performed of bilateral carotid and vertebral arteries in the neck. COMPARISON:  None. FINDINGS: Criteria: Quantification of carotid stenosis is based on velocity parameters that correlate the residual internal carotid diameter with NASCET-based stenosis levels, using the diameter of the distal internal carotid lumen  as the denominator for stenosis measurement. The following velocity measurements were obtained: RIGHT ICA:  171/18 cm/sec CCA:  762/8 cm/sec SYSTOLIC ICA/CCA RATIO:  1.7 DIASTOLIC ICA/CCA RATIO:  2.3 ECA:  188 cm/sec LEFT ICA:  149/23 cm/sec CCA:  31/51 cm/sec SYSTOLIC ICA/CCA RATIO:  1.7 DIASTOLIC ICA/CCA RATIO:  1.9 ECA:  163 cm/sec RIGHT CAROTID ARTERY: Moderate heterogeneous diffuse right carotid bifurcation atherosclerosis. This results in proximal right ICA luminal narrowing by grayscale imaging. In this region there is mild velocity elevation measuring 171/18 centimeters/second. Mild turbulent flow. Degree of stenosis estimated at 50-69% by ultrasound criteria. RIGHT VERTEBRAL ARTERY:  Antegrade LEFT CAROTID ARTERY: Moderate heterogeneous calcified left carotid bifurcation atherosclerosis. Proximal ICA luminal narrowing by grayscale imaging. Left ICA mild velocity elevation up to 149/23 centimeters/second. Degree of stenosis also estimated at 50-69%. LEFT VERTEBRAL ARTERY:  Antegrade IMPRESSION: Moderate bilateral ICA stenoses estimated at 50-69% by ultrasound criteria. Patent antegrade vertebral flow bilaterally Electronically Signed   By: Jerilynn Mages.  Shick M.D.   On: 08/11/2017 09:24   US Venous Img Lower Bilateral  Result Date: 08/10/2017 CLINICAL DATA:  Bilateral lower extremity swelling. EXAM: BILATERAL LOWER EXTREMITY VENOUS DOPPLER ULTRASOUND TECHNIQUE: Gray-scale sonography with graded compression, as well as  color Doppler and duplex ultrasound were performed to evaluate the lower extremity deep venous systems from the level of the common femoral vein and including the common femoral, femoral, profunda femoral, popliteal and calf veins including the posterior tibial, peroneal and gastrocnemius veins when visible. The superficial great saphenous vein was also interrogated. Spectral Doppler was utilized to evaluate flow at rest and with distal augmentation maneuvers in the common femoral, femoral and  popliteal veins. COMPARISON:  Ultrasound of December 12, 2016. FINDINGS: RIGHT LOWER EXTREMITY Common Femoral Vein: Partially compressible with some flow present. Echogenic mural thrombus is noted most consistent with old DVT. Saphenofemoral Junction: Partially compressible with some flow present. Findings most consistent with chronic DVT. Profunda Femoral Vein: Partially compressible with some flow present. Findings most consistent with chronic DVT. Femoral Vein: No evidence of thrombus. Normal compressibility, respiratory phasicity and response to augmentation. Popliteal Vein: No evidence of thrombus. Normal compressibility, respiratory phasicity and response to augmentation. Calf Veins: Status post right below-knee amputation. Venous Reflux:  None. Other Findings:  None. LEFT LOWER EXTREMITY Common Femoral Vein: No evidence of thrombus. Normal compressibility, respiratory phasicity and response to augmentation. Saphenofemoral Junction: No evidence of thrombus. Normal compressibility and flow on color Doppler imaging. Profunda Femoral Vein: No evidence of thrombus. Normal compressibility and flow on color Doppler imaging. Femoral Vein: No evidence of thrombus. Normal compressibility, respiratory phasicity and response to augmentation. Popliteal Vein: No evidence of thrombus. Normal compressibility, respiratory phasicity and response to augmentation. Calf Veins: No evidence of thrombus. Normal compressibility and flow on color Doppler imaging. Superficial Great Saphenous Vein: No evidence of thrombus. Normal compressibility. Venous Reflux:  None. Other Findings:  None. IMPRESSION: Findings most consistent with recannulized chronic deep venous thrombosis in the right common femoral vein, right saphenofemoral junction and right profunda femoral vein. No evidence of deep venous thrombosis seen in left lower extremity. Electronically Signed   By: Marijo Conception, M.D.   On: 08/10/2017 19:01     Assessment/Plan:   84  y.o. female with a known history of DVT who was on Coumadin until about December, hypertension, non-insulin-dependent diabetes mellitus presents from home secondary to worsening dizziness for the past 5 days that is non positional associated with nausea. She does not ambulate at baseline due to her right leg amputation and uses a wheelchair. Symptoms started again last night, did not resolve and were much worse and so brought to the emergency room today.   Risk Factors: HTN, DM  Was was not consistently on ASA  MRI with significant intracranial stenosis and L cerebellar ischemia.   Chronic R DVT.  Since 5 days stroke is subacute I am ok with restarting anticoagulation tomorrow with coumadin but for now pt is on ASA which I increased to 325 daily Con't ASA until therapeutic on coumadin at which time would switch to ASA 81 daily along with coumadin  CTH repeat tomorrow to make sure no sings of hydrocephalus and 4th ventricle is open which will likely be case as symptoms are 33 days old.    Juanita Craver, Aydrien Froman  08/11/2017, 1:22 PM

## 2017-08-11 NOTE — Progress Notes (Signed)
lovenox dose increased from 30mg  daily to 40 mg daily due to a crcl >12ml/min and TBW >45kg  Melissa D Maccia, Pharm.D, BCPS Clinical Pharmacist

## 2017-08-11 NOTE — Progress Notes (Signed)
ANTICOAGULATION CONSULT NOTE - Initial Consult  Pharmacy Consult for apixaban to start 4/1 for tx of DVT Indication: DVT  Allergies  Allergen Reactions  . Amoxicillin Other (See Comments)    Has patient had a PCN reaction causing immediate rash, facial/tongue/throat swelling, SOB or lightheadedness with hypotension: Unknown Has patient had a PCN reaction causing severe rash involving mucus membranes or skin necrosis: Unknown Has patient had a PCN reaction that required hospitalization: Unknown Has patient had a PCN reaction occurring within the last 10 years: Unknown If all of the above answers are "NO", then may proceed with Cephalosporin use.   . Azithromycin     vomiting and stomach upset    Patient Measurements: Height: 5\' 6"  (167.6 cm) Weight: 130 lb 3.2 oz (59.1 kg) IBW/kg (Calculated) : 59.3  Vital Signs: Temp: 99 F (37.2 C) (03/31 0907) Temp Source: Oral (03/31 0907) BP: 188/58 (03/31 0949) Pulse Rate: 60 (03/31 0949)  Labs: Recent Labs    08/10/17 1304 08/11/17 0421  HGB 10.0* 9.1*  HCT 30.3* 27.6*  PLT 415 330  CREATININE 1.40* 1.30*  TROPONINI <0.03  --     Estimated Creatinine Clearance: 31.7 mL/min (A) (by C-G formula based on SCr of 1.3 mg/dL (H)).   Medical History: Past Medical History:  Diagnosis Date  . Depression   . Diabetes mellitus without complication ([place])   . History of deep vein thrombosis    about 30 years ago  . Hyperlipidemia 07/06/2005  . Hypertension     Medications:  Medications Prior to Admission  Medication Sig Dispense Refill Last Dose  . amLODipine (NORVASC) 10 MG tablet Take 1 tablet (10 mg total) by mouth daily. (Patient taking differently: Take 5 mg by mouth daily. ) 30 tablet 5 08/09/2017 at 0800  . Cetirizine HCl (ZYRTEC ALLERGY) 10 MG CAPS Take 1 capsule by mouth daily.    PRN at PRN  . gabapentin (NEURONTIN) 300 MG capsule TAKE (1) CAPSULE TWICE DAILY AS NEEDED. 180 capsule 0 08/09/2017 at Unknown time  .  glipiZIDE (GLUCOTROL) 10 MG tablet Take 1 tablet (10 mg total) by mouth daily. 30 tablet 5 08/09/2017 at 0800  . hydrochlorothiazide (HYDRODIURIL) 25 MG tablet Take 1 tablet (25 mg total) by mouth daily. 90 tablet 3 08/09/2017 at Unknown time  . lisinopril (PRINIVIL,ZESTRIL) 10 MG tablet Take 1 tablet (10 mg total) by mouth daily. 90 tablet 3 08/09/2017 at Unknown time  . metFORMIN (GLUCOPHAGE) 500 MG tablet 2 tablets daily 180 tablet 2 08/09/2017 at 1200  . metoprolol (LOPRESSOR) 50 MG tablet Take 1 tablet (50 mg total) by mouth 2 (two) times daily. 180 tablet 1 08/09/2017 at 2000  . apixaban (ELIQUIS) 5 MG TABS tablet Take 1 tablet (5 mg total) by mouth 2 (two) times daily. (Patient not taking: Reported on 08/10/2017) 60 tablet 0 Not Taking at Unknown time  . doxycycline (VIBRA-TABS) 100 MG tablet Take 1 tablet (100 mg total) by mouth 2 (two) times daily. (Patient not taking: Reported on 03/05/2017) 20 tablet 0 Completed Course at Unknown time  . doxycycline (VIBRAMYCIN) 100 MG capsule Take 1 capsule (100 mg total) by mouth 2 (two) times daily. (Patient not taking: Reported on 08/10/2017) 20 capsule 0 Not Taking at Unknown time  . glipiZIDE (GLUCOTROL) 10 MG tablet Take 1 tablet (10 mg total) by mouth daily. (Patient not taking: Reported on 08/10/2017) 90 tablet 0 Not Taking at Unknown time  . metoprolol tartrate (LOPRESSOR) 50 MG tablet Take 1 tablet (50 mg  total) by mouth 2 (two) times daily. (Patient not taking: Reported on 08/10/2017) 60 tablet 5 Not Taking at Unknown time  . mirtazapine (REMERON) 30 MG tablet Take 1 tablet (30 mg total) by mouth at bedtime. (Patient not taking: Reported on 08/10/2017) 90 tablet 0 Not Taking at Unknown time  . ondansetron (ZOFRAN) 4 MG tablet Take 1 tablet (4 mg total) by mouth every 8 (eight) hours as needed for nausea or vomiting. (Patient not taking: Reported on 08/10/2017) 20 tablet 0 Completed Course at Unknown time   Scheduled:  . [START ON 08/12/2017] apixaban  10 mg  Oral BID   Followed by  . [START ON 08/19/2017] apixaban  5 mg Oral BID  . aspirin EC  81 mg Oral Daily  . atorvastatin  40 mg Oral q1800  . enoxaparin (LOVENOX) injection  40 mg Subcutaneous Q24H  . glipiZIDE  10 mg Oral Daily  . hydrALAZINE  10 mg Intravenous Once  . insulin aspart  0-5 Units Subcutaneous QHS  . insulin aspart  0-9 Units Subcutaneous TID WC  . metoprolol tartrate  50 mg Oral BID  . mirtazapine  30 mg Oral QHS   Infusions:  . dextrose 5 % and 0.9 % NaCl with KCl 20 mEq/L 75 mL/hr at 08/10/17 2307   PRN: acetaminophen **OR** acetaminophen, hydrALAZINE, meclizine, ondansetron **OR** ondansetron (ZOFRAN) IV Anti-infectives (From admission, onward)   None      Assessment: 84 year old female, with stroke, Dr Benjie Karvonen would like to start treatment dosing of DVT with eliquis. We discussed this via phone, Dr Benjie Karvonen wants to start eliquis 4/1 after one last dose of enoxaparin tonight.   Goal of Therapy:  anticoagulation Monitor platelets by anticoagulation protocol: Yes   Plan:  40mg  Lovenox dose tonight, starting tomorrow morning apixaban 10mg  po bid x 7 days followed by apixaban 5mg  po bid thereafter. This plan was discussed with Dr Benjie Karvonen, its what she wants. Pharmacy to follow and do apixaban counseling if possible    Melanie Diaz 08/11/2017,10:53 AM

## 2017-08-11 NOTE — Plan of Care (Signed)
  Problem: Education: Goal: Knowledge of General Education information will improve Outcome: Progressing   Problem: Health Behavior/Discharge Planning: Goal: Ability to manage health-related needs will improve Outcome: Progressing   Problem: Clinical Measurements: Goal: Ability to maintain clinical measurements within normal limits will improve Outcome: Progressing Goal: Will remain free from infection Outcome: Progressing Goal: Diagnostic test results will improve Outcome: Progressing Goal: Respiratory complications will improve Outcome: Progressing Goal: Cardiovascular complication will be avoided Outcome: Progressing   Problem: Activity: Goal: Risk for activity intolerance will decrease Outcome: Progressing   Problem: Nutrition: Goal: Adequate nutrition will be maintained Outcome: Progressing   Problem: Coping: Goal: Level of anxiety will decrease Outcome: Progressing   Problem: Elimination: Goal: Will not experience complications related to bowel motility Outcome: Progressing Goal: Will not experience complications related to urinary retention Outcome: Progressing   Problem: Pain Managment: Goal: General experience of comfort will improve Outcome: Progressing   Problem: Safety: Goal: Ability to remain free from injury will improve Outcome: Progressing   Problem: Skin Integrity: Goal: Risk for impaired skin integrity will decrease Outcome: Progressing   Problem: Education: Goal: Knowledge of disease or condition will improve Outcome: Progressing Goal: Knowledge of secondary prevention will improve Outcome: Progressing Goal: Knowledge of patient specific risk factors addressed and post discharge goals established will improve Outcome: Progressing   Problem: Coping: Goal: Will identify appropriate support needs Outcome: Progressing   Problem: Health Behavior/Discharge Planning: Goal: Ability to manage health-related needs will improve Outcome:  Progressing   Problem: Self-Care: Goal: Ability to communicate needs accurately will improve Outcome: Progressing   Problem: Nutrition: Goal: Risk of aspiration will decrease Outcome: Progressing   Problem: Ischemic Stroke/TIA Tissue Perfusion: Goal: Complications of ischemic stroke/TIA will be minimized Outcome: Progressing

## 2017-08-11 NOTE — Evaluation (Signed)
Physical Therapy Evaluation Patient Details Name: Melanie Diaz MRN: [id number] DOB: 29-Jul-1933 Today's Date: 08/11/2017   History of Present Illness  Pt admitted for L cerebellar CVA. Pt with history of DVT previously on coumadin, however stopped, HTN, DM. Of note, chronic R LE DVT. Discussed with MD about anticoagulation, stated this was not acute DVT and they are restarting her anticoagulation, ok to evaluate and work with her.  Clinical Impression  Pt is a pleasant 84 year old female who was admitted for acute/subacute L cerebellar CVA originally complaining of worsening dizziness x 5 days not related to position. Pt is non ambulatory at baseline. Pt performs bed mobility with min assist and unable to further perform transfers. Pt demonstrates deficits with L UE strength/mobility. Coordination intact on L UE. Keeps head turned to L due to headache and nausea. Symptoms worsened with bed mobility. Pt is currently not at baseline level. Would benefit from skilled PT to address above deficits and promote optimal return to PLOF; recommend transition to STR upon discharge from acute hospitalization.       Follow Up Recommendations SNF    Equipment Recommendations  (TBD)    Recommendations for Other Services       Precautions / Restrictions Precautions Precautions: Fall Restrictions Weight Bearing Restrictions: No      Mobility  Bed Mobility Overal bed mobility: Needs Assistance Bed Mobility: Rolling Rolling: Min assist         General bed mobility comments: needs slight assist to get on side during rolling to L side. Unable to roll towards R side as she became extremely nauseated. Further bed mobility deferred at this time  Transfers                 General transfer comment: unable to attempt  Ambulation/Gait                Stairs            Wheelchair Mobility    Modified Rankin (Stroke Patients Only)       Balance                                              Pertinent Vitals/Pain Pain Assessment: Faces Faces Pain Scale: Hurts little more Pain Location: L side headache and nausea Pain Descriptors / Indicators: Discomfort;Dull Pain Intervention(s): Limited activity within patient's tolerance    Home Living Family/patient expects to be discharged to:: Private residence Living Arrangements: Alone Available Help at Discharge: Family;Available PRN/intermittently Type of Home: House Home Access: Ramped entrance     Home Layout: One level Home Equipment: Wheelchair - manual      Prior Function Level of Independence: Independent with assistive device(s)         Comments: able to transfer to [facility] independently and uses WC primarily. Doesn't ambulate at baseline     Hand Dominance        Extremity/Trunk Assessment   Upper Extremity Assessment Upper Extremity Assessment: Generalized weakness(L UE shoulder flexor 3+/5; grip 4/5; R UE 5/5)    Lower Extremity Assessment Lower Extremity Assessment: Overall WFL for tasks assessed       Communication   Communication: No difficulties  Cognition Arousal/Alertness: Awake/alert Behavior During Therapy: Flat affect Overall Cognitive Status: Within Functional Limits for tasks assessed  General Comments General comments (skin integrity, edema, etc.): R AKA fully healed incision    Exercises     Assessment/Plan    PT Assessment Patient needs continued PT services  PT Problem List Decreased strength;Decreased mobility;Decreased knowledge of use of DME;Pain       PT Treatment Interventions DME instruction;Functional mobility training;Therapeutic activities;Therapeutic exercise;Balance training;Wheelchair mobility training    PT Goals (Current goals can be found in the Care Plan section)  Acute Rehab PT Goals Patient Stated Goal: to get stronger PT Goal Formulation: With patient Time For  Goal Achievement: 08/25/17 Potential to Achieve Goals: Good    Frequency 7X/week   Barriers to discharge        Co-evaluation               AM-PAC PT "6 Clicks" Daily Activity  Outcome Measure Difficulty turning over in bed (including adjusting bedclothes, sheets and blankets)?: Unable Difficulty moving from lying on back to sitting on the side of the bed? : Unable Difficulty sitting down on and standing up from a chair with arms (e.g., wheelchair, bedside commode, etc,.)?: Unable Help needed moving to and from a bed to chair (including a wheelchair)?: Total Help needed walking in hospital room?: Total Help needed climbing 3-5 steps with a railing? : Total 6 Click Score: 6    End of Session   Activity Tolerance: Patient limited by pain;Patient limited by fatigue Patient left: in bed;with bed alarm set;with family/visitor present Nurse Communication: Mobility status PT Visit Diagnosis: Muscle weakness (generalized) (M62.81);Pain Pain - Right/Left: Left Pain - part of body: (head)    Time: 9450-3888 PT Time Calculation (min) (ACUTE ONLY): 13 min   Charges:   PT Evaluation $PT Eval Low Complexity: 1 Low     PT G CodesGreggory Stallion, PT, DPT [phone]   Jakita Dutkiewicz 08/11/2017, 1:37 PM

## 2017-08-12 ENCOUNTER — Inpatient Hospital Stay: Payer: Medicare Other

## 2017-08-12 LAB — GLUCOSE, CAPILLARY
GLUCOSE-CAPILLARY: 150 mg/dL — AB (ref 65–99)
GLUCOSE-CAPILLARY: 132 mg/dL — AB (ref 65–99)
Glucose-Capillary: 163 mg/dL — ABNORMAL HIGH (ref 65–99)
Glucose-Capillary: 111 mg/dL — ABNORMAL HIGH (ref 65–99)

## 2017-08-12 LAB — BASIC METABOLIC PANEL
ANION GAP: 7 (ref 5–15)
BUN: 25 mg/dL — ABNORMAL HIGH (ref 6–20)
CALCIUM: 7.6 mg/dL — AB (ref 8.9–10.3)
CO2: 19 mmol/L — ABNORMAL LOW (ref 22–32)
CREATININE: 1.28 mg/dL — AB (ref 0.44–1.00)
Chloride: 114 mmol/L — ABNORMAL HIGH (ref 101–111)
GFR, EST AFRICAN AMERICAN: 44 mL/min — AB (ref >60)
GFR, EST NON AFRICAN AMERICAN: 38 mL/min — AB (ref >60)
Glucose, Bld: 159 mg/dL — ABNORMAL HIGH (ref 65–99)
Potassium: 3.4 mmol/L — ABNORMAL LOW (ref 3.5–5.1)
SODIUM: 140 mmol/L (ref 135–145)

## 2017-08-12 LAB — PROTIME-INR
INR: 1.05
PROTHROMBIN TIME: 13.6 s (ref 11.4–15.2)

## 2017-08-12 MED ORDER — WARFARIN SODIUM 5 MG PO TABS
5.0000 mg | ORAL_TABLET | Freq: Once | ORAL | Status: AC
  Administered 2017-08-12: 17:00:00 5 mg via ORAL
  Filled 2017-08-12: qty 1

## 2017-08-12 MED ORDER — AMLODIPINE BESYLATE 10 MG PO TABS
10.0000 mg | ORAL_TABLET | Freq: Every day | ORAL | Status: DC
  Administered 2017-08-12 – 2017-08-14 (×3): 10 mg via ORAL
  Filled 2017-08-12 (×4): qty 1

## 2017-08-12 MED ORDER — KETOROLAC TROMETHAMINE 30 MG/ML IJ SOLN
15.0000 mg | Freq: Four times a day (QID) | INTRAMUSCULAR | Status: DC | PRN
  Administered 2017-08-12: 15 mg via INTRAVENOUS
  Filled 2017-08-12: qty 1

## 2017-08-12 MED ORDER — POTASSIUM CHLORIDE CRYS ER 20 MEQ PO TBCR
40.0000 meq | EXTENDED_RELEASE_TABLET | Freq: Once | ORAL | Status: AC
  Administered 2017-08-12: 12:00:00 40 meq via ORAL
  Filled 2017-08-12: qty 2

## 2017-08-12 MED ORDER — SODIUM CHLORIDE 0.9 % IV SOLN
INTRAVENOUS | Status: AC
  Administered 2017-08-12 – 2017-08-13 (×2): via INTRAVENOUS

## 2017-08-12 MED ORDER — MECLIZINE HCL 12.5 MG PO TABS
12.5000 mg | ORAL_TABLET | Freq: Two times a day (BID) | ORAL | Status: DC
  Administered 2017-08-12 – 2017-08-13 (×3): 12.5 mg via ORAL
  Filled 2017-08-12 (×3): qty 1

## 2017-08-12 MED ORDER — LISINOPRIL 10 MG PO TABS
10.0000 mg | ORAL_TABLET | Freq: Every day | ORAL | Status: DC
  Administered 2017-08-12 – 2017-08-14 (×3): 10 mg via ORAL
  Filled 2017-08-12 (×4): qty 1

## 2017-08-12 NOTE — Clinical Social Work Note (Signed)
CSW spoke to patient and her daughter, patient is hesitant but agreeable to going to SNF for short term rehab.  Patient and daughter gave CSW permission to begin bed search in [place], patient prefers either [facility] or [facility] [place].  Formal assessment to follow.  Melanie Diaz. [place], MSW, [place]  08/12/2017 6:20 PM

## 2017-08-12 NOTE — Progress Notes (Signed)
SLP Cancellation Note  Patient Details Name: Melanie Diaz MRN: [id number] DOB: February 20, 1934   Cancelled treatment:       Reason Eval/Treat Not Completed: SLP screened, no needs identified, will sign off(chart reviewed; consulted NSG then met w/ pt/family). Pt denied any difficulty swallowing and is currently on a regular diet; tolerates swallowing pills w/ water per NSG. Pt stated she is "very nauseated" at this time and "it is difficult to eat anything". NSG aware. Granddaughter stated pt was not "the best of eaters before". Pt conversed at conversational level w/out deficits noted; pt and family denied any speech-language deficits.  No further skilled ST services indicated as pt appears at her baseline. Pt agreed. NSG to reconsult if any change in status.    Melanie Kenner, MS, CCC-SLP Diaz,Melanie 08/12/2017, 1:26 PM

## 2017-08-12 NOTE — Progress Notes (Signed)
PT Cancellation Note  Patient Details Name: Melanie Diaz MRN: [id number] DOB: 02/25/1934   Cancelled Treatment:    Reason Eval/Treat Not Completed: Patient declined, no reason specified. Pt continues to feel ill this afternoon declining therapy. Re attempt tomorrow as pt symptoms allow.    Larae Grooms, PTA 08/12/2017, 2:37 PM

## 2017-08-12 NOTE — Progress Notes (Signed)
OT Cancellation Note  Patient Details Name: Melanie Diaz MRN: [id number] DOB: 1933/07/06   Cancelled Treatment:    Reason Eval/Treat Not Completed: Patient declined, no reason specified. Order received and chart reviewed.  Pt is dizzy and nauseated and getting Zofran from NSG at the time of attempting evaluation.  Will attempt again tomorrow.  Chrys Racer, OTR/L ascom [phone] 08/12/17, 2:07 PM

## 2017-08-12 NOTE — Progress Notes (Signed)
ANTICOAGULATION CONSULT NOTE - Initial Consult  Pharmacy Consult for warfarin Indication: DVT  Allergies  Allergen Reactions  . Amoxicillin Other (See Comments)    Has patient had a PCN reaction causing immediate rash, facial/tongue/throat swelling, SOB or lightheadedness with hypotension: Unknown Has patient had a PCN reaction causing severe rash involving mucus membranes or skin necrosis: Unknown Has patient had a PCN reaction that required hospitalization: Unknown Has patient had a PCN reaction occurring within the last 10 years: Unknown If all of the above answers are "NO", then may proceed with Cephalosporin use.   . Azithromycin     vomiting and stomach upset    Patient Measurements: Height: 5\' 6"  (167.6 cm) Weight: 130 lb 3.2 oz (59.1 kg) IBW/kg (Calculated) : 59.3  Vital Signs: Temp: 98.5 F (36.9 C) (04/01 0423) Temp Source: Oral (04/01 0423) BP: 183/72 (04/01 0532) Pulse Rate: 80 (04/01 0532)  Labs: Recent Labs    08/10/17 1304 08/11/17 0421 08/11/17 1228 08/12/17 0440  HGB 10.0* 9.1*  --   --   HCT 30.3* 27.6*  --   --   PLT 415 330  --   --   LABPROT  --   --  13.2 13.6  INR  --   --  1.01 1.05  CREATININE 1.40* 1.30*  --  1.28*  TROPONINI <0.03  --   --   --     Estimated Creatinine Clearance: 32.2 mL/min (A) (by C-G formula based on SCr of 1.28 mg/dL (H)).   Medical History: Past Medical History:  Diagnosis Date  . Depression   . Diabetes mellitus without complication ([place])   . History of deep vein thrombosis    about 30 years ago  . Hyperlipidemia 07/06/2005  . Hypertension    Assessment: Pharmacy consulted for warfarin dosing and monitoring in this 84 year old female admitted with CVA. Patient has a history of DVT and was taking warfarin through December 2018. Patient has chronic DVT and warfarin is being resumed.  Per outpatient notes, appears patient was on warfarin 3 mg PO daily in December 2018.  Dosing  history Date INR Dose 4/1 1  Goal of Therapy:  INR 2-3 Monitor platelets by anticoagulation protocol: Yes   Plan:  Will order warfarin 5 mg PO once this evening and check INR daily. Will take several days to see elevation in INR. Per MD, no enoxaparin bridge given recent CVA. Continue to monitor INR and adjust dose as needed.  Lenis Noon, PharmD, BCPS Clinical Pharmacist 08/12/2017,8:58 AM

## 2017-08-12 NOTE — Progress Notes (Signed)
PT Cancellation Note  Patient Details Name: KORTNIE STOVALL MRN: [id number] DOB: 21-May-1933   Cancelled Treatment:    Reason Eval/Treat Not Completed: Patient declined, no reason specified;Other (comment). Treatment attempted; pt refused, as pt has just returned from test/procedure and is feeling "ill and extremely tired". Re attempt later today as pt symptoms allow.    Larae Grooms, PTA 08/12/2017, 12:08 PM

## 2017-08-12 NOTE — Progress Notes (Signed)
[facility] [place] NAME: Melanie Diaz    MR#:  [id number]  DATE OF BIRTH:  04-22-1934  SUBJECTIVE:  Patient still with vertigo/nausea Daughter at bedside   REVIEW OF SYSTEMS:    Review of Systems  Constitutional: Negative for fever, chills weight loss HENT: Negative for ear pain, nosebleeds, congestion, facial swelling, rhinorrhea, neck pain, neck stiffness and ear discharge.   Respiratory: Negative for cough, shortness of breath, wheezing  Cardiovascular: Negative for chest pain, palpitations and leg swelling.  Gastrointestinal: Negative for heartburn, abdominal pain, vomiting, diarrhea or consitpation positive nausea Genitourinary: Negative for dysuria, urgency, frequency, hematuria Musculoskeletal: Negative for back pain or joint pain Neurological: +vertigo , NOseizures, syncope, focal weakness,  numbness and headaches.  Hematological: Does not bruise/bleed easily.  Psychiatric/Behavioral: Negative for hallucinations, confusion, dysphoric mood    Tolerating Diet: Due to nausea she is not eating      DRUG ALLERGIES:   Allergies  Allergen Reactions  . Amoxicillin Other (See Comments)    Has patient had a PCN reaction causing immediate rash, facial/tongue/throat swelling, SOB or lightheadedness with hypotension: Unknown Has patient had a PCN reaction causing severe rash involving mucus membranes or skin necrosis: Unknown Has patient had a PCN reaction that required hospitalization: Unknown Has patient had a PCN reaction occurring within the last 10 years: Unknown If all of the above answers are "NO", then may proceed with Cephalosporin use.   . Azithromycin     vomiting and stomach upset    VITALS:  Blood pressure (!) 181/72, pulse 86, temperature 99.1 F (37.3 C), temperature source Oral, resp. rate 16, height 5\' 6"  (1.676 m), weight 59.1 kg (130 lb 3.2 oz), SpO2 96 %.  PHYSICAL EXAMINATION:  Constitutional: Appears  well-developed and well-nourished. No distress. HENT: Normocephalic. Marland Kitchen Oropharynx is clear and moist.  Eyes: Conjunctivae and EOM are normal. PERRLA, no scleral icterus.  Neck: Normal ROM. Neck supple. No JVD. No tracheal deviation. CVS: RRR, S1/S2 +, no murmurs, no gallops, no carotid bruit.  Pulmonary: Effort and breath sounds normal, no stridor, rhonchi, wheezes, rales.  Abdominal: Soft. BS +,  no distension, tenderness, rebound or guarding.  Musculoskeletal: Normal range of motion. No edema and no tenderness.  Neuro: Alert. CN 2-12 grossly intact. No focal deficits. Skin: Skin is warm and dry. No rash noted. Psychiatric: Normal mood and affect.      LABORATORY PANEL:   CBC Recent Labs  Lab 08/11/17 0421  WBC 12.7*  HGB 9.1*  HCT 27.6*  PLT 330   ------------------------------------------------------------------------------------------------------------------  Chemistries  Recent Labs  Lab 08/10/17 1304  08/12/17 0440  NA 135   < > 140  K 3.0*   < > 3.4*  CL 102   < > 114*  CO2 17*   < > 19*  GLUCOSE 239*   < > 159*  BUN 37*   < > 25*  CREATININE 1.40*   < > 1.28*  CALCIUM 8.4*   < > 7.6*  AST 25  --   --   ALT 17  --   --   ALKPHOS 86  --   --   BILITOT 0.4  --   --    < > = values in this interval not displayed.   ------------------------------------------------------------------------------------------------------------------  Cardiac Enzymes Recent Labs  Lab 08/10/17 1304  TROPONINI <0.03   ------------------------------------------------------------------------------------------------------------------  RADIOLOGY:  Ct Head Wo Contrast  Result Date: 08/10/2017 CLINICAL DATA:  Dizziness, nausea. EXAM: CT  HEAD WITHOUT CONTRAST TECHNIQUE: Contiguous axial images were obtained from the base of the skull through the vertex without intravenous contrast. COMPARISON:  None. FINDINGS: Brain: Mild chronic ischemic white matter disease. Low density is noted in  left cerebellar hemisphere concerning for infarction of indeterminate age. No mass effect or midline shift is noted. Ventricular size is within normal limits. No hemorrhage is noted. Vascular: No hyperdense vessel or unexpected calcification. Skull: Normal. Negative for fracture or focal lesion. Sinuses/Orbits: No acute finding. Other: None. IMPRESSION: Left cerebellar low density is noted concerning for infarction of indeterminate age. MRI is recommended for further evaluation. Electronically Signed   By: Marijo Conception, M.D.   On: 08/10/2017 15:01   Mr Jodene Nam Head Wo Contrast  Result Date: 08/10/2017 CLINICAL DATA:  Dizziness. EXAM: MRI HEAD WITHOUT CONTRAST MRA HEAD WITHOUT CONTRAST TECHNIQUE: Multiplanar, multiecho pulse sequences of the brain and surrounding structures were obtained without intravenous contrast. Angiographic images of the head were obtained using MRA technique without contrast. COMPARISON:  Head CT 08/10/2017 FINDINGS: MRI HEAD FINDINGS Brain: There is moderate volume patchy restricted diffusion and T2 hyperintensity in the left cerebellar hemisphere extending into the vermis consistent with acute to early subacute infarct. No intracranial hemorrhage, mass, midline shift, or extra-axial fluid collection is identified. There is no significant posterior fossa mass effect. Scattered small foci of T2 hyperintensity in the cerebral white matter are nonspecific but compatible with mild chronic small vessel ischemic disease. Cerebral atrophy is mild-to-moderate and most notable in the perisylvian regions. Vascular: There is an abnormal appearance of the distal left vertebral artery. Other major intracranial vascular flow voids are preserved. Skull and upper cervical spine: No suspicious marrow lesion. Sinuses/Orbits: Bilateral cataract extraction. Paranasal sinuses and mastoid air cells are clear. Other: 1.7 cm cyst lateral to the posterior alveolar ridge of the right maxilla. MRA HEAD FINDINGS The  visualized distal right vertebral artery is patent and supplies the basilar. A small amount of flow related enhancement is present in the left V3 segment, however the more distal left V3 and V4 segments are occluded. Patent right PICA, bilateral AICA, and bilateral SCA origins are identified. There is evidence of a severe left SCA origin stenosis. The basilar artery is patent with a moderate stenosis just distal to the AICA origins. There is a small left posterior communicating artery. PCAs are patent with mild proximal left P2 and severe distal left P2 stenoses, and there is mild asymmetric attenuation of distal left PCA branch vessels compared to the right. No significant proximal right PCA stenosis is identified. The internal carotid arteries are patent from skull base to carotid termini with atherosclerotic siphon irregularity bilaterally. ICA stenosis at the level of the anterior genu is moderate on the right and mild on the left. ACAs and MCAs are patent without evidence of proximal branch occlusion. There is mild mid right M1 stenosis, and there also likely mild proximal right A1 and bilateral distal A1 stenoses. No aneurysm is identified. IMPRESSION: 1. Acute to early subacute left cerebellar infarcts. 2. Mild chronic small vessel ischemic disease. 3. 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 4. Intracranial atherosclerosis including moderate basilar artery stenosis, severe left SCA stenosis, and moderate right and mild left ICA stenosis. Electronically Signed   By: Logan Bores M.D.   On: 08/10/2017 16:46   Mr Brain Wo Contrast  Result Date: 08/10/2017 CLINICAL DATA:  Dizziness. EXAM: MRI HEAD WITHOUT CONTRAST MRA HEAD WITHOUT CONTRAST TECHNIQUE: Multiplanar, multiecho pulse sequences of the brain and  surrounding structures were obtained without intravenous contrast. Angiographic images of the head were obtained using MRA technique without contrast. COMPARISON:  Head CT 08/10/2017 FINDINGS: MRI  HEAD FINDINGS Brain: There is moderate volume patchy restricted diffusion and T2 hyperintensity in the left cerebellar hemisphere extending into the vermis consistent with acute to early subacute infarct. No intracranial hemorrhage, mass, midline shift, or extra-axial fluid collection is identified. There is no significant posterior fossa mass effect. Scattered small foci of T2 hyperintensity in the cerebral white matter are nonspecific but compatible with mild chronic small vessel ischemic disease. Cerebral atrophy is mild-to-moderate and most notable in the perisylvian regions. Vascular: There is an abnormal appearance of the distal left vertebral artery. Other major intracranial vascular flow voids are preserved. Skull and upper cervical spine: No suspicious marrow lesion. Sinuses/Orbits: Bilateral cataract extraction. Paranasal sinuses and mastoid air cells are clear. Other: 1.7 cm cyst lateral to the posterior alveolar ridge of the right maxilla. MRA HEAD FINDINGS The visualized distal right vertebral artery is patent and supplies the basilar. A small amount of flow related enhancement is present in the left V3 segment, however the more distal left V3 and V4 segments are occluded. Patent right PICA, bilateral AICA, and bilateral SCA origins are identified. There is evidence of a severe left SCA origin stenosis. The basilar artery is patent with a moderate stenosis just distal to the AICA origins. There is a small left posterior communicating artery. PCAs are patent with mild proximal left P2 and severe distal left P2 stenoses, and there is mild asymmetric attenuation of distal left PCA branch vessels compared to the right. No significant proximal right PCA stenosis is identified. The internal carotid arteries are patent from skull base to carotid termini with atherosclerotic siphon irregularity bilaterally. ICA stenosis at the level of the anterior genu is moderate on the right and mild on the left. ACAs and  MCAs are patent without evidence of proximal branch occlusion. There is mild mid right M1 stenosis, and there also likely mild proximal right A1 and bilateral distal A1 stenoses. No aneurysm is identified. IMPRESSION: 1. Acute to early subacute left cerebellar infarcts. 2. Mild chronic small vessel ischemic disease. 3. 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 4. Intracranial atherosclerosis including moderate basilar artery stenosis, severe left SCA stenosis, and moderate right and mild left ICA stenosis. Electronically Signed   By: Logan Bores M.D.   On: 08/10/2017 16:46   US Carotid Bilateral  Result Date: 08/11/2017 CLINICAL DATA:  Hypertension, stroke, hyperlipidemia, diabetes EXAM: BILATERAL CAROTID DUPLEX ULTRASOUND TECHNIQUE: Pearline Cables scale imaging, color Doppler and duplex ultrasound were performed of bilateral carotid and vertebral arteries in the neck. COMPARISON:  None. FINDINGS: Criteria: Quantification of carotid stenosis is based on velocity parameters that correlate the residual internal carotid diameter with NASCET-based stenosis levels, using the diameter of the distal internal carotid lumen as the denominator for stenosis measurement. The following velocity measurements were obtained: RIGHT ICA:  171/18 cm/sec CCA:  314/9 cm/sec SYSTOLIC ICA/CCA RATIO:  1.7 DIASTOLIC ICA/CCA RATIO:  2.3 ECA:  188 cm/sec LEFT ICA:  149/23 cm/sec CCA:  70/26 cm/sec SYSTOLIC ICA/CCA RATIO:  1.7 DIASTOLIC ICA/CCA RATIO:  1.9 ECA:  163 cm/sec RIGHT CAROTID ARTERY: Moderate heterogeneous diffuse right carotid bifurcation atherosclerosis. This results in proximal right ICA luminal narrowing by grayscale imaging. In this region there is mild velocity elevation measuring 171/18 centimeters/second. Mild turbulent flow. Degree of stenosis estimated at 50-69% by ultrasound criteria. RIGHT VERTEBRAL ARTERY:  Antegrade LEFT CAROTID  ARTERY: Moderate heterogeneous calcified left carotid bifurcation atherosclerosis. Proximal  ICA luminal narrowing by grayscale imaging. Left ICA mild velocity elevation up to 149/23 centimeters/second. Degree of stenosis also estimated at 50-69%. LEFT VERTEBRAL ARTERY:  Antegrade IMPRESSION: Moderate bilateral ICA stenoses estimated at 50-69% by ultrasound criteria. Patent antegrade vertebral flow bilaterally Electronically Signed   By: Jerilynn Mages.  Shick M.D.   On: 08/11/2017 09:24   US Venous Img Lower Bilateral  Result Date: 08/10/2017 CLINICAL DATA:  Bilateral lower extremity swelling. EXAM: BILATERAL LOWER EXTREMITY VENOUS DOPPLER ULTRASOUND TECHNIQUE: Gray-scale sonography with graded compression, as well as color Doppler and duplex ultrasound were performed to evaluate the lower extremity deep venous systems from the level of the common femoral vein and including the common femoral, femoral, profunda femoral, popliteal and calf veins including the posterior tibial, peroneal and gastrocnemius veins when visible. The superficial great saphenous vein was also interrogated. Spectral Doppler was utilized to evaluate flow at rest and with distal augmentation maneuvers in the common femoral, femoral and popliteal veins. COMPARISON:  Ultrasound of December 12, 2016. FINDINGS: RIGHT LOWER EXTREMITY Common Femoral Vein: Partially compressible with some flow present. Echogenic mural thrombus is noted most consistent with old DVT. Saphenofemoral Junction: Partially compressible with some flow present. Findings most consistent with chronic DVT. Profunda Femoral Vein: Partially compressible with some flow present. Findings most consistent with chronic DVT. Femoral Vein: No evidence of thrombus. Normal compressibility, respiratory phasicity and response to augmentation. Popliteal Vein: No evidence of thrombus. Normal compressibility, respiratory phasicity and response to augmentation. Calf Veins: Status post right below-knee amputation. Venous Reflux:  None. Other Findings:  None. LEFT LOWER EXTREMITY Common Femoral Vein:  No evidence of thrombus. Normal compressibility, respiratory phasicity and response to augmentation. Saphenofemoral Junction: No evidence of thrombus. Normal compressibility and flow on color Doppler imaging. Profunda Femoral Vein: No evidence of thrombus. Normal compressibility and flow on color Doppler imaging. Femoral Vein: No evidence of thrombus. Normal compressibility, respiratory phasicity and response to augmentation. Popliteal Vein: No evidence of thrombus. Normal compressibility, respiratory phasicity and response to augmentation. Calf Veins: No evidence of thrombus. Normal compressibility and flow on color Doppler imaging. Superficial Great Saphenous Vein: No evidence of thrombus. Normal compressibility. Venous Reflux:  None. Other Findings:  None. IMPRESSION: Findings most consistent with recannulized chronic deep venous thrombosis in the right common femoral vein, right saphenofemoral junction and right profunda femoral vein. No evidence of deep venous thrombosis seen in left lower extremity. Electronically Signed   By: Marijo Conception, M.D.   On: 08/10/2017 19:01     ASSESSMENT AND PLAN:   84 year old female with history of DVT who is on Coumadin and did not tolerate Eliquis, diabetes and essential hypertension who presented with dizziness.  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  Aspirin 325 mg as recommended by neurology until INR is therapeutic.  After this patient will be on Coumadin as well as aspirin 81 mg daily once Coumadin level is therapeutic. Continue statin. Physical therapy is recommended skilled nursing facility upon discharge.  Symptoms of nausea are due to the  cerebellar infarct. Supportive management for nausea  Repeat CT head today shows left cerebellar, PCA distribution, infarct has evolved as expected since the studies performed 2 days ago. It  is better defined associated with mild mass effect. It is consistent with a subacute infarct Await Neuro opinion today due to repeat head CT findings as above.   2.Cannulized chronic deep venous thrombosis in the right common femoral vein, right saphenofemoral junction and right profunda femoral vein Patient will continue Coumadin without bridging due to acute CVA.    3.  Diabetes: Continue sliding scale Holding oral medications for now as patient has decreased oral intake due to nausea  4.  Essential hypertension: Patient may restart oral medications now that she is a few days out from CVA.  5.  Depression: Continue Remeron  6.  Hypokalemia: Replete PRN Management plans discussed with the patient and she is in agreement.  CODE STATUS: full  TOTAL TIME TAKING CARE OF THIS PATIENT: 24 minutes.     POSSIBLE D/C tomorrow, DEPENDING ON CLINICAL CONDITION.   Inocencia Murtaugh M.D on 08/12/2017 at 11:22 AM  Between 7am to 6pm - Pager - [phone] After 6pm go to [email or web address] - password [email or web address] [facility]  Office  [phone]  CC: Primary care physician; [name]., MD  Note: This dictation was prepared with Dragon dictation along with smaller phrase technology. Any transcriptional errors that result from this process are unintentional.

## 2017-08-12 NOTE — NC FL2 (Addendum)
[place] LEVEL OF CARE SCREENING TOOL     IDENTIFICATION  Patient Name: Melanie Diaz Birthdate: [birth year] Sex: female Admission Date (Current Location): 08/10/2017  [place] Number:  Engineering geologist and Address:  [facility], [address], [place], [place] [address]      Provider Number: [id number]  Attending Physician Name and Address:  Bettey Costa, MD  Relative Name and Phone Number:  Loistine Chance Daughter   [phone] or Kathlen Brunswick   [phone] or Jamey Ripa [phone]     Current Level of Care: Hospital Recommended Level of Care: [place] Prior Approval Number:    Date Approved/Denied:   PASRR Number: [id number]  Discharge Plan: SNF    Current Diagnoses: Patient Active Problem List   Diagnosis Date Noted  . Stroke (cerebrum) ([place]) 08/10/2017  . Chronic anticoagulation 12/03/2016  . DVT (deep venous thrombosis) ([place]) 11/22/2016  . Hypertension 12/20/2014  . Allergic rhinitis 11/16/2014  . Cannot sleep 11/16/2014  . Peripheral blood vessel disorder ([place]) 11/16/2014  . Phantom limb syndrome ([place]) 11/16/2014  . Reactive depression (situational) 11/16/2014  . Amputation of lower limb ([place]) 11/16/2014  . Diabetes mellitus, type 2 ([place]) 11/16/2014  . Amputation of leg, traumatic ([place]) 06/18/2012  . Osteomyelitis of ankle and foot ([place]) 02/20/2012  . Gangrene of foot ([place]) 02/20/2012  . DM (diabetes mellitus), secondary ([place]) 07/06/2005  . HLD (hyperlipidemia) 07/06/2005    Orientation RESPIRATION BLADDER Height & Weight     Self, Time, Situation, Place  Normal Continent Weight: 130 lb 3.2 oz (59.1 kg) Height:  5\' 6"  (167.6 cm)  BEHAVIORAL SYMPTOMS/MOOD NEUROLOGICAL BOWEL NUTRITION STATUS      Continent Diet(Cardiac Diet)  AMBULATORY STATUS COMMUNICATION OF NEEDS Skin   Limited Assist Non-Verbally Normal                       Personal  Care Assistance Level of Assistance  Bathing, Feeding, Dressing Bathing Assistance: Limited assistance Feeding assistance: Independent Dressing Assistance: Limited assistance     Functional Limitations Info  Hearing, Speech, Sight Sight Info: Adequate Hearing Info: Adequate Speech Info: Adequate    SPECIAL CARE FACTORS FREQUENCY  PT (By licensed PT)     PT Frequency: 5x a week              Contractures Contractures Info: Not present    Additional Factors Info  Code Status, Allergies, Psychotropic, Insulin Sliding Scale Code Status Info: Full code Allergies Info: AMOXICILLIN, AZITHROMYCIN  Psychotropic Info: mirtazapine (REMERON) tablet 30 mg  Insulin Sliding Scale Info: insulin aspart (novoLOG) injection 0-9 Units        Current Medications (08/12/2017):  This is the current hospital active medication list Current Facility-Administered Medications  Medication Dose Route Frequency Provider Last Rate Last Dose  . 0.9 %  sodium chloride infusion   Intravenous Continuous Bettey Costa, MD 75 mL/hr at 08/12/17 1426    . acetaminophen (TYLENOL) tablet 650 mg  650 mg Oral Q6H PRN Gladstone Lighter, MD       Or  . acetaminophen (TYLENOL) suppository 650 mg  650 mg Rectal Q6H PRN Gladstone Lighter, MD      . amLODipine (NORVASC) tablet 10 mg  10 mg Oral Daily Bettey Costa, MD   10 mg at 08/12/17 1204  . aspirin EC tablet 325 mg  325 mg Oral Daily Leotis Pain, MD   325 mg at 08/12/17 0931  . atorvastatin (LIPITOR) tablet  40 mg  40 mg Oral q1800 Gladstone Lighter, MD      . hydrALAZINE (APRESOLINE) injection 10 mg  10 mg Intravenous Once Bettey Costa, MD      . hydrALAZINE (APRESOLINE) injection 10 mg  10 mg Intravenous Q6H PRN Mody, Sital, MD      . insulin aspart (novoLOG) injection 0-5 Units  0-5 Units Subcutaneous QHS Gladstone Lighter, MD      . insulin aspart (novoLOG) injection 0-9 Units  0-9 Units Subcutaneous TID WC Gladstone Lighter, MD   1 Units at 08/12/17 1204  .  ketorolac (TORADOL) 30 MG/ML injection 15 mg  15 mg Intravenous Q6H PRN Bettey Costa, MD   15 mg at 08/12/17 1426  . lisinopril (PRINIVIL,ZESTRIL) tablet 10 mg  10 mg Oral Daily Bettey Costa, MD   10 mg at 08/12/17 1204  . meclizine (ANTIVERT) tablet 12.5 mg  12.5 mg Oral BID Bettey Costa, MD   12.5 mg at 08/12/17 1204  . metoprolol tartrate (LOPRESSOR) tablet 50 mg  50 mg Oral BID Gladstone Lighter, MD   50 mg at 08/12/17 0931  . mirtazapine (REMERON) tablet 30 mg  30 mg Oral QHS Gladstone Lighter, MD   30 mg at 08/10/17 2128  . ondansetron (ZOFRAN) tablet 4 mg  4 mg Oral Q6H PRN Gladstone Lighter, MD       Or  . ondansetron (ZOFRAN) injection 4 mg  4 mg Intravenous Q6H PRN Gladstone Lighter, MD   4 mg at 08/12/17 1405  . promethazine (PHENERGAN) injection 12.5 mg  12.5 mg Intravenous Q6H PRN Bettey Costa, MD   12.5 mg at 08/12/17 0420  . warfarin (COUMADIN) tablet 5 mg  5 mg Oral ONCE-1800 Lenis Noon, [facility]      . Warfarin - Pharmacist Dosing Inpatient   Does not apply [id number] Bettey Costa, MD         Discharge Medications: Please see discharge summary for a list of discharge medications.  Relevant Imaging Results:  Relevant Lab Results:   Additional Information SSN [id number]  Ross Ludwig, [place]

## 2017-08-13 LAB — BASIC METABOLIC PANEL
Anion gap: 6 (ref 5–15)
BUN: 26 mg/dL — ABNORMAL HIGH (ref 6–20)
CHLORIDE: 118 mmol/L — AB (ref 101–111)
CO2: 17 mmol/L — ABNORMAL LOW (ref 22–32)
Calcium: 7.2 mg/dL — ABNORMAL LOW (ref 8.9–10.3)
Creatinine, Ser: 1.21 mg/dL — ABNORMAL HIGH (ref 0.44–1.00)
GFR, EST AFRICAN AMERICAN: 47 mL/min — AB (ref >60)
GFR, EST NON AFRICAN AMERICAN: 41 mL/min — AB (ref >60)
Glucose, Bld: 142 mg/dL — ABNORMAL HIGH (ref 65–99)
POTASSIUM: 3.4 mmol/L — AB (ref 3.5–5.1)
SODIUM: 141 mmol/L (ref 135–145)

## 2017-08-13 LAB — GLUCOSE, CAPILLARY
GLUCOSE-CAPILLARY: 131 mg/dL — AB (ref 65–99)
GLUCOSE-CAPILLARY: 163 mg/dL — AB (ref 65–99)
Glucose-Capillary: 96 mg/dL (ref 65–99)
Glucose-Capillary: 143 mg/dL — ABNORMAL HIGH (ref 65–99)

## 2017-08-13 LAB — PROTIME-INR
INR: 1.3
Prothrombin Time: 16.1 seconds — ABNORMAL HIGH (ref 11.4–15.2)

## 2017-08-13 MED ORDER — MECLIZINE HCL 25 MG PO TABS
12.5000 mg | ORAL_TABLET | Freq: Once | ORAL | Status: AC
  Administered 2017-08-13: 10:00:00 12.5 mg via ORAL
  Filled 2017-08-13: qty 0.5

## 2017-08-13 MED ORDER — WARFARIN SODIUM 3 MG PO TABS
3.0000 mg | ORAL_TABLET | Freq: Every day | ORAL | Status: DC
  Administered 2017-08-13: 3 mg via ORAL
  Filled 2017-08-13: qty 1

## 2017-08-13 NOTE — Care Management Important Message (Signed)
Important Message  Patient Details  Name: LAKEA MITTELMAN MRN: [id number] Date of Birth: 1933/06/25   Medicare Important Message Given:  Yes    Shelbie Ammons, RN 08/13/2017, 7:09 AM

## 2017-08-13 NOTE — Progress Notes (Signed)
ANTICOAGULATION CONSULT NOTE - Initial Consult  Pharmacy Consult for warfarin Indication: DVT  Allergies  Allergen Reactions  . Amoxicillin Other (See Comments)    Has patient had a PCN reaction causing immediate rash, facial/tongue/throat swelling, SOB or lightheadedness with hypotension: Unknown Has patient had a PCN reaction causing severe rash involving mucus membranes or skin necrosis: Unknown Has patient had a PCN reaction that required hospitalization: Unknown Has patient had a PCN reaction occurring within the last 10 years: Unknown If all of the above answers are "NO", then may proceed with Cephalosporin use.   . Azithromycin     vomiting and stomach upset    Patient Measurements: Height: 5\' 6"  (167.6 cm) Weight: 130 lb 3.2 oz (59.1 kg) IBW/kg (Calculated) : 59.3  Vital Signs: Temp: 99.2 F (37.3 C) (04/02 0444) Temp Source: Oral (04/02 0444) BP: 175/60 (04/02 0444) Pulse Rate: 68 (04/02 0444)  Labs: Recent Labs    08/10/17 1304 08/11/17 0421 08/11/17 1228 08/12/17 0440 08/13/17 0657  HGB 10.0* 9.1*  --   --   --   HCT 30.3* 27.6*  --   --   --   PLT 415 330  --   --   --   LABPROT  --   --  13.2 13.6 16.1*  INR  --   --  1.01 1.05 1.30  CREATININE 1.40* 1.30*  --  1.28* 1.21*  TROPONINI <0.03  --   --   --   --     Estimated Creatinine Clearance: 34 mL/min (A) (by C-G formula based on SCr of 1.21 mg/dL (H)).   Medical History: Past Medical History:  Diagnosis Date  . Depression   . Diabetes mellitus without complication ([place])   . History of deep vein thrombosis    about 30 years ago  . Hyperlipidemia 07/06/2005  . Hypertension    Assessment: Pharmacy consulted for warfarin dosing and monitoring in this 83 year old female admitted with CVA. Patient has a history of DVT and was taking warfarin through December 2018. Patient has chronic DVT and warfarin is being resumed.  Per outpatient notes, appears patient was on warfarin 3 mg PO daily in  December 2018.  Dosing history Date INR Dose 4/1 1 5  mg 4/2 1.3  Goal of Therapy:  INR 2-3 Monitor platelets by anticoagulation protocol: Yes   Plan:  INR = 1.3 remains low but increased after one dose of warfarin. Would not anticipate seeing INR increase this quickly. Will resume warfarin 3 mg PO daily (appears patient was on this dose in December 2018 per outpatient notes) Per MD, no enoxaparin bridge given recent CVA. Per notes, ASA 325 mg until INR is therapeutic at which time ASA will be changed to 81 mg PO Daily. Continue to monitor INR and adjust dose as needed.  Lenis Noon, PharmD, BCPS Clinical Pharmacist 08/13/2017,9:01 AM

## 2017-08-13 NOTE — Evaluation (Signed)
Occupational Therapy Evaluation Patient Details Name: Melanie Diaz MRN: [id number] DOB: 06-27-1933 Today's Date: 08/13/2017    History of Present Illness Pt admitted for L cerebellar CVA. Pt with history of DVT previously on coumadin, however stopped, HTN, DM. Of note, chronic R LE DVT. Discussed with MD about anticoagulation, stated this was not acute DVT and they are restarting her anticoagulation, ok to evaluate and work with her.   Clinical Impression   Pt seen for OT evaluation this date. Prior to hospital admission, pt was modified independent from w/c level (hx R AKA) and living by herself with PRN family assist. Currently pt is significant limited primarily by dizziness/"sea-sickness" and resulting nausea. Pt required min assist for supine to long sitting in the bed in order for pt to take medications from RN. Unable to tolerate the [facility] being elevated, insisted to have physical assist for the transitional movement because it didn't increase her dizziness "as much". Pt notes she has increased dizziness when turning quickly to the right as well. Pt declined additional functional mobility for ADL tasks at this time. Pt with noted impaired activity tolerance and BUE strength. Pt would benefit from skilled OT to address noted impairments and functional limitations (see below for any additional details).  Upon hospital discharge, recommend pt discharge to STR due to current impairments making her at a higher risk of falls, unsafe for her to return home, and requiring increased burden of care.     Follow Up Recommendations  SNF    Equipment Recommendations  Other (comment)(TBD)    Recommendations for Other Services       Precautions / Restrictions Precautions Precautions: Fall Restrictions Weight Bearing Restrictions: No      Mobility Bed Mobility Overal bed mobility: Needs Assistance Bed Mobility: Supine to Sit     Supine to sit: Min assist     General bed mobility comments:  min assist to transition from supine to long sitting in bed temporarily so that pt could take medications from RN, pt able to maintain with UE support on bed  Transfers                 General transfer comment: unable to attempt due to dizziness/"sea-sickness" and nausea    Balance                                           ADL either performed or assessed with clinical judgement   ADL Overall ADL's : Needs assistance/impaired Eating/Feeding: Set up;Sitting   Grooming: Sitting;Set up                                 General ADL Comments: limited functional assessment due to pt's dizziness/nausea, pt sat up with Min assist in bed to take medications and drink, but then requested to lay back down declining to sit upright any further due to back discomfort (chronic) and dizziness. Pt would likely need min-mod assist for LB ADL, supervision to min assist for UB seated ADL. Currently very limited due to dizziness and nausea as a result of the dizziness/"sea-sickness"     Vision Baseline Vision/History: Wears glasses Wears Glasses: Reading only Patient Visual Report: No change from baseline Vision Assessment?: No apparent visual deficits     Perception     Praxis  Pertinent Vitals/Pain Pain Assessment: No/denies pain     Hand Dominance Right   Extremity/Trunk Assessment Upper Extremity Assessment Upper Extremity Assessment: Generalized weakness(bilateral shoulder and elbow flexion 4+/5, elbow extension 4-/5, grip 4/5; sensation intact, coordination intact)   Lower Extremity Assessment Lower Extremity Assessment: Defer to PT evaluation       Communication Communication Communication: No difficulties   Cognition Arousal/Alertness: Awake/alert Behavior During Therapy: WFL for tasks assessed/performed Overall Cognitive Status: Within Functional Limits for tasks assessed                                     General  Comments       Exercises     Shoulder Instructions      Home Living Family/patient expects to be discharged to:: Private residence Living Arrangements: Alone Available Help at Discharge: Family;Available PRN/intermittently Type of Home: House Home Access: Ramped entrance     Home Layout: One level     Bathroom Shower/Tub: Teacher, early years/pre: Standard     Home Equipment: Wheelchair - manual          Prior Functioning/Environment Level of Independence: Independent with assistive device(s)        Comments: able to transfer to [facility] independently and uses WC primarily. Doesn't ambulate at baseline        OT Problem List: Decreased strength      OT Treatment/Interventions: Self-care/ADL training;Therapeutic exercise;Therapeutic activities;Patient/family education;DME and/or AE instruction    OT Goals(Current goals can be found in the care plan section) Acute Rehab OT Goals Patient Stated Goal: have dizziness resolve and get stronger OT Goal Formulation: With patient/family Time For Goal Achievement: 08/27/17 Potential to Achieve Goals: Good  OT Frequency: Min 1X/week   Barriers to D/C:            Co-evaluation              AM-PAC PT "6 Clicks" Daily Activity     Outcome Measure Help from another person eating meals?: None Help from another person taking care of personal grooming?: None Help from another person toileting, which includes using toliet, bedpan, or urinal?: A Little Help from another person bathing (including washing, rinsing, drying)?: A Little Help from another person to put on and taking off regular upper body clothing?: A Little Help from another person to put on and taking off regular lower body clothing?: A Little 6 Click Score: 20   End of Session    Activity Tolerance: Treatment limited secondary to medical complications (Comment)(limited by dizziness/"sea-sickness" and nausea) Patient left: in bed;with call  bell/phone within reach;with bed alarm set;with family/visitor present  OT Visit Diagnosis: Other abnormalities of gait and mobility (R26.89)                Time: 2119-4174 OT Time Calculation (min): 15 min Charges:  OT General Charges $OT Visit: 1 Visit OT Evaluation $OT Eval Moderate Complexity: 1 Mod  Jeni Salles, MPH, MS, OTR/L ascom [phone] 08/13/17, 10:31 AM

## 2017-08-13 NOTE — Progress Notes (Addendum)
Subjective: Patient stable.  Continues to complain of dizziness and nausea.  Is unable to sit up in bed or move around due to nausea.  Reports some mild improvement with meclizine but not enough to be able to be more mobile.  Is experiencing some mild drowsiness with the meclizine.  Objective: Current vital signs: BP (!) 175/60 (BP Location: Right Arm)   Pulse 68   Temp 99.2 F (37.3 C) (Oral)   Resp 16   Ht 5\' 6"  (1.676 m)   Wt 59.1 kg (130 lb 3.2 oz)   SpO2 98%   BMI 21.01 kg/m  Vital signs in last 24 hours: Temp:  [98.7 F (37.1 C)-99.2 F (37.3 C)] 99.2 F (37.3 C) (04/02 0444) Pulse Rate:  [60-68] 68 (04/02 0444) Resp:  [16-20] 16 (04/02 0444) BP: (168-190)/(60-76) 175/60 (04/02 0444) SpO2:  [95 %-100 %] 98 % (04/02 0444)  Intake/Output from previous day: 04/01 0701 - 04/02 0700 In: 237.5 [P.O.:120; I.V.:117.5] Out: -  Intake/Output this shift: No intake/output data recorded. Nutritional status: Diet heart healthy/carb modified Room service appropriate? Yes; Fluid consistency: Thin  Neurologic Exam: Mental Status: Resting but easily alerted.  Speech fluent without evidence of aphasia.  Able to follow 3 step commands without difficulty. Cranial Nerves: II: Discs flat bilaterally; Visual fields grossly normal, pupils equal, round, reactive to light and accommodation III,IV, VI: ptosis not present, extra-ocular motions intact bilaterally V,VII: smile symmetric, facial light touch sensation normal bilaterally VIII: hearing normal bilaterally IX,X: gag reflex present XI: bilateral shoulder shrug XII: midline tongue extension Motor: 5/5 in the upper extremities Sensory: Pinprick and light touch intact throughout, bilaterally Deep Tendon Reflexes: 1+ throughout Cerebellar: Dysmetria on L sided   Lab Results: Basic Metabolic Panel: Recent Labs  Lab 08/10/17 1304 08/11/17 0421 08/12/17 0440 08/13/17 0657  NA 135 138 140 141  K 3.0* 3.3* 3.4* 3.4*  CL 102 109  114* 118*  CO2 17* 21* 19* 17*  GLUCOSE 239* 166* 159* 142*  BUN 37* 33* 25* 26*  CREATININE 1.40* 1.30* 1.28* 1.21*  CALCIUM 8.4* 7.5* 7.6* 7.2*    Liver Function Tests: Recent Labs  Lab 08/10/17 1304  AST 25  ALT 17  ALKPHOS 86  BILITOT 0.4  PROT 6.2*  ALBUMIN 2.6*   No results for input(s): LIPASE, AMYLASE in the last 168 hours. No results for input(s): AMMONIA in the last 168 hours.  CBC: Recent Labs  Lab 08/10/17 1304 08/11/17 0421  WBC 12.9* 12.7*  NEUTROABS 10.6*  --   HGB 10.0* 9.1*  HCT 30.3* 27.6*  MCV 80.9 80.3  PLT 415 330    Cardiac Enzymes: Recent Labs  Lab 08/10/17 1304  TROPONINI <0.03    Lipid Panel: Recent Labs  Lab 08/10/17 1304  CHOL 345*  TRIG 214*  HDL 44  CHOLHDL 7.8  VLDL 43*  LDLCALC 258*    CBG: Recent Labs  Lab 08/12/17 0745 08/12/17 1148 08/12/17 1645 08/12/17 2115 08/13/17 0741  GLUCAP 163* 150* 111* 132* 131*    Microbiology: No results found for this or any previous visit.  Coagulation Studies: Recent Labs    08/11/17 1228 08/12/17 0440 08/13/17 0657  LABPROT 13.2 13.6 16.1*  INR 1.01 1.05 1.30    Imaging: Ct Head Wo Contrast  Result Date: 08/12/2017 CLINICAL DATA:  Worsening dizziness for the past 5 days, non positional, with associated nausea. History of DVT. Was on Coumadin therapy till 3 months ago. EXAM: CT HEAD WITHOUT CONTRAST TECHNIQUE: Contiguous axial images  were obtained from the base of the skull through the vertex without intravenous contrast. COMPARISON:  08/10/2017 FINDINGS: Brain: The left PCA distribution still biller infarct has evolved, no better defined. There is no other evidence of a recent infarct. The ventricles are normal in size and configuration. There are no parenchymal masses. Slight compression of the lower fourth ventricle due to the subacute left cerebellar infarct. No other mass effect. Patchy areas of white matter hypoattenuation are noted consistent with mild chronic  microvascular ischemic change. Old deep white matter infarct in the right parietal lobe. Old right basal ganglia lacune infarct. No extra-axial masses or abnormal fluid collections. There is no intracranial hemorrhage. Vascular: No hyperdense vessel or unexpected calcification. Skull: Normal. Negative for fracture or focal lesion. Sinuses/Orbits: Globes and orbits are unremarkable. Visualized sinuses and mastoid air cells are clear. Other: None. IMPRESSION: 1. Left cerebellar, PCA distribution, infarct has evolved as expected since the studies performed 2 days ago. It is better defined associated with mild mass effect. It is consistent with a subacute infarct. 2. No new abnormalities. No intracranial hemorrhage. No hydrocephalus. Electronically Signed   By: Lajean Manes M.D.   On: 08/12/2017 10:36    Medications:  I have reviewed the patient's current medications. Scheduled: . amLODipine  10 mg Oral Daily  . aspirin EC  325 mg Oral Daily  . atorvastatin  40 mg Oral q1800  . hydrALAZINE  10 mg Intravenous Once  . insulin aspart  0-5 Units Subcutaneous QHS  . insulin aspart  0-9 Units Subcutaneous TID WC  . lisinopril  10 mg Oral Daily  . metoprolol tartrate  50 mg Oral BID  . mirtazapine  30 mg Oral QHS  . warfarin  3 mg Oral q1800  . Warfarin - Pharmacist Dosing Inpatient   Does not apply q1800    Assessment/Plan: Patient with cerebellar infarct.  Has continued with significant dizziness and nausea.  Currently on meclizine 12.5 mg.  Is having some mild degree of sleepiness with this meclizine.  Will increase meclizine today to 25 mg.  If the patient is not experiencing good symptomatic relief with this increase or has excessive sedation will change to Valium. Head CT on yesterday reviewed and shows normal evolution of left cerebellar infarct with some degree of mild edema surrounding the infarct.  There is no significant compromise of the fourth ventricle.  There is no evidence of hemorrhagic  conversion.  Recommendations: 1.  Patient is received meclizine 12.5 mg approximately 1 hour ago.  Will get another dose of 12.5 mg now.  Will reevaluate patient in 2-3 hours.  Will make decisions on further medical management at that time.  Case discussed with D. Mody   LOS: 3 days   Alexis Goodell, MD Neurology [phone] 08/13/2017  10:54 AM

## 2017-08-13 NOTE — Clinical Social Work Note (Signed)
CSW presented bed offers to patient's daughter and she agreed to going to [facility].  CSW attempted to contact [facility] they can accept patient once she is medically ready for discharge and insurance authorization has been received.  Jones Broom. [place], MSW, [place]  08/13/2017 3:03 PM

## 2017-08-13 NOTE — Progress Notes (Signed)
[facility] NAME: Melanie Diaz    MR#:  [id number]  DATE OF BIRTH:  82-04-1934  SUBJECTIVE:  Patient still with vertigo/nausea    REVIEW OF SYSTEMS:    Review of Systems  Constitutional: Negative for fever, chills weight loss HENT: Negative for ear pain, nosebleeds, congestion, facial swelling, rhinorrhea, neck pain, neck stiffness and ear discharge.   Respiratory: Negative for cough, shortness of breath, wheezing  Cardiovascular: Negative for chest pain, palpitations and leg swelling.  Gastrointestinal: Negative for heartburn, abdominal pain, vomiting, diarrhea or consitpation positive nausea Genitourinary: Negative for dysuria, urgency, frequency, hematuria Musculoskeletal: Negative for back pain or joint pain Neurological: +vertigo , NOseizures, syncope, focal weakness,  numbness and headaches.  Hematological: Does not bruise/bleed easily.  Psychiatric/Behavioral: Negative for hallucinations, confusion, dysphoric mood    Tolerating Diet: Due to nausea she is not eating      DRUG ALLERGIES:   Allergies  Allergen Reactions  . Amoxicillin Other (See Comments)    Has patient had a PCN reaction causing immediate rash, facial/tongue/throat swelling, SOB or lightheadedness with hypotension: Unknown Has patient had a PCN reaction causing severe rash involving mucus membranes or skin necrosis: Unknown Has patient had a PCN reaction that required hospitalization: Unknown Has patient had a PCN reaction occurring within the last 10 years: Unknown If all of the above answers are "NO", then may proceed with Cephalosporin use.   . Azithromycin     vomiting and stomach upset    VITALS:  Blood pressure (!) 175/60, pulse 68, temperature 99.2 F (37.3 C), temperature source Oral, resp. rate 16, height 5\' 6"  (1.676 m), weight 59.1 kg (130 lb 3.2 oz), SpO2 98 %.  PHYSICAL EXAMINATION:  Constitutional: Appears well-developed and  well-nourished. No distress. HENT: Normocephalic. Marland Kitchen Oropharynx is clear and moist.  Eyes: Conjunctivae and EOM are normal. PERRLA, no scleral icterus.  Neck: Normal ROM. Neck supple. No JVD. No tracheal deviation. CVS: RRR, S1/S2 +, no murmurs, no gallops, no carotid bruit.  Pulmonary: Effort and breath sounds normal, no stridor, rhonchi, wheezes, rales.  Abdominal: Soft. BS +,  no distension, tenderness, rebound or guarding.  Musculoskeletal: Normal range of motion. No edema and no tenderness.  Neuro: Alert. CN 2-12 grossly intact. No focal deficits. Skin: Skin is warm and dry. No rash noted. Psychiatric: Normal mood and affect.      LABORATORY PANEL:   CBC Recent Labs  Lab 08/11/17 0421  WBC 12.7*  HGB 9.1*  HCT 27.6*  PLT 330   ------------------------------------------------------------------------------------------------------------------  Chemistries  Recent Labs  Lab 08/10/17 1304  08/13/17 0657  NA 135   < > 141  K 3.0*   < > 3.4*  CL 102   < > 118*  CO2 17*   < > 17*  GLUCOSE 239*   < > 142*  BUN 37*   < > 26*  CREATININE 1.40*   < > 1.21*  CALCIUM 8.4*   < > 7.2*  AST 25  --   --   ALT 17  --   --   ALKPHOS 86  --   --   BILITOT 0.4  --   --    < > = values in this interval not displayed.   ------------------------------------------------------------------------------------------------------------------  Cardiac Enzymes Recent Labs  Lab 08/10/17 1304  TROPONINI <0.03   ------------------------------------------------------------------------------------------------------------------  RADIOLOGY:  Ct Head Wo Contrast  Result Date: 08/10/2017 CLINICAL DATA:  Dizziness, nausea. EXAM: CT HEAD WITHOUT  CONTRAST TECHNIQUE: Contiguous axial images were obtained from the base of the skull through the vertex without intravenous contrast. COMPARISON:  None. FINDINGS: Brain: Mild chronic ischemic white matter disease. Low density is noted in left cerebellar  hemisphere concerning for infarction of indeterminate age. No mass effect or midline shift is noted. Ventricular size is within normal limits. No hemorrhage is noted. Vascular: No hyperdense vessel or unexpected calcification. Skull: Normal. Negative for fracture or focal lesion. Sinuses/Orbits: No acute finding. Other: None. IMPRESSION: Left cerebellar low density is noted concerning for infarction of indeterminate age. MRI is recommended for further evaluation. Electronically Signed   By: Marijo Conception, M.D.   On: 08/10/2017 15:01   Mr Jodene Nam Head Wo Contrast  Result Date: 08/10/2017 CLINICAL DATA:  Dizziness. EXAM: MRI HEAD WITHOUT CONTRAST MRA HEAD WITHOUT CONTRAST TECHNIQUE: Multiplanar, multiecho pulse sequences of the brain and surrounding structures were obtained without intravenous contrast. Angiographic images of the head were obtained using MRA technique without contrast. COMPARISON:  Head CT 08/10/2017 FINDINGS: MRI HEAD FINDINGS Brain: There is moderate volume patchy restricted diffusion and T2 hyperintensity in the left cerebellar hemisphere extending into the vermis consistent with acute to early subacute infarct. No intracranial hemorrhage, mass, midline shift, or extra-axial fluid collection is identified. There is no significant posterior fossa mass effect. Scattered small foci of T2 hyperintensity in the cerebral white matter are nonspecific but compatible with mild chronic small vessel ischemic disease. Cerebral atrophy is mild-to-moderate and most notable in the perisylvian regions. Vascular: There is an abnormal appearance of the distal left vertebral artery. Other major intracranial vascular flow voids are preserved. Skull and upper cervical spine: No suspicious marrow lesion. Sinuses/Orbits: Bilateral cataract extraction. Paranasal sinuses and mastoid air cells are clear. Other: 1.7 cm cyst lateral to the posterior alveolar ridge of the right maxilla. MRA HEAD FINDINGS The visualized  distal right vertebral artery is patent and supplies the basilar. A small amount of flow related enhancement is present in the left V3 segment, however the more distal left V3 and V4 segments are occluded. Patent right PICA, bilateral AICA, and bilateral SCA origins are identified. There is evidence of a severe left SCA origin stenosis. The basilar artery is patent with a moderate stenosis just distal to the AICA origins. There is a small left posterior communicating artery. PCAs are patent with mild proximal left P2 and severe distal left P2 stenoses, and there is mild asymmetric attenuation of distal left PCA branch vessels compared to the right. No significant proximal right PCA stenosis is identified. The internal carotid arteries are patent from skull base to carotid termini with atherosclerotic siphon irregularity bilaterally. ICA stenosis at the level of the anterior genu is moderate on the right and mild on the left. ACAs and MCAs are patent without evidence of proximal branch occlusion. There is mild mid right M1 stenosis, and there also likely mild proximal right A1 and bilateral distal A1 stenoses. No aneurysm is identified. IMPRESSION: 1. Acute to early subacute left cerebellar infarcts. 2. Mild chronic small vessel ischemic disease. 3. 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 4. Intracranial atherosclerosis including moderate basilar artery stenosis, severe left SCA stenosis, and moderate right and mild left ICA stenosis. Electronically Signed   By: Logan Bores M.D.   On: 08/10/2017 16:46   Mr Brain Wo Contrast  Result Date: 08/10/2017 CLINICAL DATA:  Dizziness. EXAM: MRI HEAD WITHOUT CONTRAST MRA HEAD WITHOUT CONTRAST TECHNIQUE: Multiplanar, multiecho pulse sequences of the brain and surrounding structures  were obtained without intravenous contrast. Angiographic images of the head were obtained using MRA technique without contrast. COMPARISON:  Head CT 08/10/2017 FINDINGS: MRI HEAD FINDINGS  Brain: There is moderate volume patchy restricted diffusion and T2 hyperintensity in the left cerebellar hemisphere extending into the vermis consistent with acute to early subacute infarct. No intracranial hemorrhage, mass, midline shift, or extra-axial fluid collection is identified. There is no significant posterior fossa mass effect. Scattered small foci of T2 hyperintensity in the cerebral white matter are nonspecific but compatible with mild chronic small vessel ischemic disease. Cerebral atrophy is mild-to-moderate and most notable in the perisylvian regions. Vascular: There is an abnormal appearance of the distal left vertebral artery. Other major intracranial vascular flow voids are preserved. Skull and upper cervical spine: No suspicious marrow lesion. Sinuses/Orbits: Bilateral cataract extraction. Paranasal sinuses and mastoid air cells are clear. Other: 1.7 cm cyst lateral to the posterior alveolar ridge of the right maxilla. MRA HEAD FINDINGS The visualized distal right vertebral artery is patent and supplies the basilar. A small amount of flow related enhancement is present in the left V3 segment, however the more distal left V3 and V4 segments are occluded. Patent right PICA, bilateral AICA, and bilateral SCA origins are identified. There is evidence of a severe left SCA origin stenosis. The basilar artery is patent with a moderate stenosis just distal to the AICA origins. There is a small left posterior communicating artery. PCAs are patent with mild proximal left P2 and severe distal left P2 stenoses, and there is mild asymmetric attenuation of distal left PCA branch vessels compared to the right. No significant proximal right PCA stenosis is identified. The internal carotid arteries are patent from skull base to carotid termini with atherosclerotic siphon irregularity bilaterally. ICA stenosis at the level of the anterior genu is moderate on the right and mild on the left. ACAs and MCAs are patent  without evidence of proximal branch occlusion. There is mild mid right M1 stenosis, and there also likely mild proximal right A1 and bilateral distal A1 stenoses. No aneurysm is identified. IMPRESSION: 1. Acute to early subacute left cerebellar infarcts. 2. Mild chronic small vessel ischemic disease. 3. 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 4. Intracranial atherosclerosis including moderate basilar artery stenosis, severe left SCA stenosis, and moderate right and mild left ICA stenosis. Electronically Signed   By: Logan Bores M.D.   On: 08/10/2017 16:46   US Carotid Bilateral  Result Date: 08/11/2017 CLINICAL DATA:  Hypertension, stroke, hyperlipidemia, diabetes EXAM: BILATERAL CAROTID DUPLEX ULTRASOUND TECHNIQUE: Pearline Cables scale imaging, color Doppler and duplex ultrasound were performed of bilateral carotid and vertebral arteries in the neck. COMPARISON:  None. FINDINGS: Criteria: Quantification of carotid stenosis is based on velocity parameters that correlate the residual internal carotid diameter with NASCET-based stenosis levels, using the diameter of the distal internal carotid lumen as the denominator for stenosis measurement. The following velocity measurements were obtained: RIGHT ICA:  171/18 cm/sec CCA:  025/8 cm/sec SYSTOLIC ICA/CCA RATIO:  1.7 DIASTOLIC ICA/CCA RATIO:  2.3 ECA:  188 cm/sec LEFT ICA:  149/23 cm/sec CCA:  52/77 cm/sec SYSTOLIC ICA/CCA RATIO:  1.7 DIASTOLIC ICA/CCA RATIO:  1.9 ECA:  163 cm/sec RIGHT CAROTID ARTERY: Moderate heterogeneous diffuse right carotid bifurcation atherosclerosis. This results in proximal right ICA luminal narrowing by grayscale imaging. In this region there is mild velocity elevation measuring 171/18 centimeters/second. Mild turbulent flow. Degree of stenosis estimated at 50-69% by ultrasound criteria. RIGHT VERTEBRAL ARTERY:  Antegrade LEFT CAROTID ARTERY: Moderate  heterogeneous calcified left carotid bifurcation atherosclerosis. Proximal ICA luminal  narrowing by grayscale imaging. Left ICA mild velocity elevation up to 149/23 centimeters/second. Degree of stenosis also estimated at 50-69%. LEFT VERTEBRAL ARTERY:  Antegrade IMPRESSION: Moderate bilateral ICA stenoses estimated at 50-69% by ultrasound criteria. Patent antegrade vertebral flow bilaterally Electronically Signed   By: Jerilynn Mages.  Shick M.D.   On: 08/11/2017 09:24   US Venous Img Lower Bilateral  Result Date: 08/10/2017 CLINICAL DATA:  Bilateral lower extremity swelling. EXAM: BILATERAL LOWER EXTREMITY VENOUS DOPPLER ULTRASOUND TECHNIQUE: Gray-scale sonography with graded compression, as well as color Doppler and duplex ultrasound were performed to evaluate the lower extremity deep venous systems from the level of the common femoral vein and including the common femoral, femoral, profunda femoral, popliteal and calf veins including the posterior tibial, peroneal and gastrocnemius veins when visible. The superficial great saphenous vein was also interrogated. Spectral Doppler was utilized to evaluate flow at rest and with distal augmentation maneuvers in the common femoral, femoral and popliteal veins. COMPARISON:  Ultrasound of December 12, 2016. FINDINGS: RIGHT LOWER EXTREMITY Common Femoral Vein: Partially compressible with some flow present. Echogenic mural thrombus is noted most consistent with old DVT. Saphenofemoral Junction: Partially compressible with some flow present. Findings most consistent with chronic DVT. Profunda Femoral Vein: Partially compressible with some flow present. Findings most consistent with chronic DVT. Femoral Vein: No evidence of thrombus. Normal compressibility, respiratory phasicity and response to augmentation. Popliteal Vein: No evidence of thrombus. Normal compressibility, respiratory phasicity and response to augmentation. Calf Veins: Status post right below-knee amputation. Venous Reflux:  None. Other Findings:  None. LEFT LOWER EXTREMITY Common Femoral Vein: No evidence  of thrombus. Normal compressibility, respiratory phasicity and response to augmentation. Saphenofemoral Junction: No evidence of thrombus. Normal compressibility and flow on color Doppler imaging. Profunda Femoral Vein: No evidence of thrombus. Normal compressibility and flow on color Doppler imaging. Femoral Vein: No evidence of thrombus. Normal compressibility, respiratory phasicity and response to augmentation. Popliteal Vein: No evidence of thrombus. Normal compressibility, respiratory phasicity and response to augmentation. Calf Veins: No evidence of thrombus. Normal compressibility and flow on color Doppler imaging. Superficial Great Saphenous Vein: No evidence of thrombus. Normal compressibility. Venous Reflux:  None. Other Findings:  None. IMPRESSION: Findings most consistent with recannulized chronic deep venous thrombosis in the right common femoral vein, right saphenofemoral junction and right profunda femoral vein. No evidence of deep venous thrombosis seen in left lower extremity. Electronically Signed   By: Marijo Conception, M.D.   On: 08/10/2017 19:01     ASSESSMENT AND PLAN:   84 year old female with history of DVT who is on Coumadin and did not tolerate Eliquis, diabetes and essential hypertension who presented with dizziness.  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  Aspirin 325 mg as recommended by neurology until INR is therapeutic.  After this patient will be on Coumadin as well as aspirin 81 mg daily once Coumadin level is therapeutic. Continue statin. Physical therapy is recommended skilled nursing facility upon discharge.  Symptoms of nausea are due to the cerebellar  infarct. Supportive management for nausea/vertigo Neurology following  Repeat CT head shows left cerebellar, PCA distribution, infarct has evolved as expected since the studies performed 2 days ago. It is  better defined associated with mild mass effect. It is consistent with a subacute infarct    2.Cannulized chronic deep venous thrombosis in the right common femoral vein, right saphenofemoral junction and right profunda femoral vein Patient will continue Coumadin without bridging due to acute CVA.    3.  Diabetes: Continue sliding scale Holding oral medications for now as patient has decreased oral intake due to nausea  4.  Essential hypertension: Patient may restart oral medications now that she is a few days out from CVA.  5.  Depression: Continue Remeron  6.  Hypokalemia: Replete PRN Management plans discussed with the patient and daughter  she is in agreement.  CODE STATUS: full  TOTAL TIME TAKING CARE OF THIS PATIENT: 24 minutes.   D/w dr Doy Mince  POSSIBLE D/C tomorrow, DEPENDING ON CLINICAL CONDITION.   Carnell Beavers M.D on 08/13/2017 at 11:50 AM  Between 7am to 6pm - Pager - [phone] After 6pm go to [email or web address] - password [email or web address] [facility]  Office  [phone]  CC: Primary care physician; [name]., MD  Note: This dictation was prepared with Dragon dictation along with smaller phrase technology. Any transcriptional errors that result from this process are unintentional.

## 2017-08-13 NOTE — Progress Notes (Signed)
PT Cancellation Note  Patient Details Name: Melanie Diaz MRN: [id number] DOB: Jul 22, 1933   Cancelled Treatment:    Reason Eval/Treat Not Completed: Other (comment)   Attempted several times this am.  Pt remains with c/o dizziness with movement and does not feel like she can sit up at this time.  Stated her medications are being adjusted.  Will continue as appropriate.    Chesley Noon 08/13/2017, 11:04 AM

## 2017-08-14 DIAGNOSIS — E1169 Type 2 diabetes mellitus with other specified complication: Secondary | ICD-10-CM | POA: Diagnosis not present

## 2017-08-14 DIAGNOSIS — Z8673 Personal history of transient ischemic attack (TIA), and cerebral infarction without residual deficits: Secondary | ICD-10-CM | POA: Diagnosis not present

## 2017-08-14 DIAGNOSIS — I69398 Other sequelae of cerebral infarction: Secondary | ICD-10-CM | POA: Diagnosis not present

## 2017-08-14 DIAGNOSIS — E789 Disorder of lipoprotein metabolism, unspecified: Secondary | ICD-10-CM | POA: Diagnosis not present

## 2017-08-14 DIAGNOSIS — I825Y9 Chronic embolism and thrombosis of unspecified deep veins of unspecified proximal lower extremity: Secondary | ICD-10-CM | POA: Diagnosis not present

## 2017-08-14 DIAGNOSIS — Z7901 Long term (current) use of anticoagulants: Secondary | ICD-10-CM | POA: Diagnosis not present

## 2017-08-14 DIAGNOSIS — I639 Cerebral infarction, unspecified: Secondary | ICD-10-CM | POA: Diagnosis not present

## 2017-08-14 DIAGNOSIS — Z7982 Long term (current) use of aspirin: Secondary | ICD-10-CM | POA: Diagnosis not present

## 2017-08-14 DIAGNOSIS — I82511 Chronic embolism and thrombosis of right femoral vein: Secondary | ICD-10-CM | POA: Diagnosis not present

## 2017-08-14 DIAGNOSIS — R110 Nausea: Secondary | ICD-10-CM | POA: Diagnosis not present

## 2017-08-14 DIAGNOSIS — R42 Dizziness and giddiness: Secondary | ICD-10-CM | POA: Diagnosis not present

## 2017-08-14 DIAGNOSIS — M6281 Muscle weakness (generalized): Secondary | ICD-10-CM | POA: Diagnosis not present

## 2017-08-14 DIAGNOSIS — Z7401 Bed confinement status: Secondary | ICD-10-CM | POA: Diagnosis not present

## 2017-08-14 DIAGNOSIS — E119 Type 2 diabetes mellitus without complications: Secondary | ICD-10-CM | POA: Diagnosis not present

## 2017-08-14 DIAGNOSIS — I10 Essential (primary) hypertension: Secondary | ICD-10-CM | POA: Diagnosis not present

## 2017-08-14 DIAGNOSIS — Z89511 Acquired absence of right leg below knee: Secondary | ICD-10-CM | POA: Diagnosis not present

## 2017-08-14 DIAGNOSIS — Z86718 Personal history of other venous thrombosis and embolism: Secondary | ICD-10-CM | POA: Diagnosis not present

## 2017-08-14 LAB — CBC
HCT: 28.3 % — ABNORMAL LOW (ref 35.0–47.0)
Hemoglobin: 9.4 g/dL — ABNORMAL LOW (ref 12.0–16.0)
MCH: 27 pg (ref 26.0–34.0)
MCHC: 33.4 g/dL (ref 32.0–36.0)
MCV: 80.9 fL (ref 80.0–100.0)
PLATELETS: 295 10*3/uL (ref 150–440)
RBC: 3.5 MIL/uL — ABNORMAL LOW (ref 3.80–5.20)
RDW: 14.7 % — AB (ref 11.5–14.5)
WBC: 8.6 10*3/uL (ref 3.6–11.0)

## 2017-08-14 LAB — GLUCOSE, CAPILLARY
GLUCOSE-CAPILLARY: 140 mg/dL — AB (ref 65–99)
GLUCOSE-CAPILLARY: 171 mg/dL — AB (ref 65–99)

## 2017-08-14 LAB — PROTIME-INR
INR: 2.61
INR: 2.78
PROTHROMBIN TIME: 27.7 s — AB (ref 11.4–15.2)
PROTHROMBIN TIME: 29.1 s — AB (ref 11.4–15.2)

## 2017-08-14 MED ORDER — GUAIFENESIN ER 600 MG PO TB12
600.0000 mg | ORAL_TABLET | Freq: Two times a day (BID) | ORAL | Status: DC
  Administered 2017-08-14: 12:00:00 600 mg via ORAL
  Filled 2017-08-14: qty 1

## 2017-08-14 MED ORDER — DEXTROMETHORPHAN POLISTIREX ER 30 MG/5ML PO SUER
30.0000 mg | Freq: Two times a day (BID) | ORAL | Status: DC
  Administered 2017-08-14: 30 mg via ORAL
  Filled 2017-08-14 (×2): qty 5

## 2017-08-14 MED ORDER — FLEET ENEMA 7-19 GM/118ML RE ENEM: 1.0000 | ENEMA | Freq: Once | RECTAL | Status: DC

## 2017-08-14 MED ORDER — POLYETHYLENE GLYCOL 3350 17 G PO PACK: 17.0000 g | PACK | Freq: Every day | ORAL | Status: DC

## 2017-08-14 MED ORDER — DM-GUAIFENESIN ER 30-600 MG PO TB12: 1.0000 | ORAL_TABLET | Freq: Two times a day (BID) | ORAL | Status: DC

## 2017-08-14 MED ORDER — POLYETHYLENE GLYCOL 3350 17 G PO PACK: 17.0000 g | PACK | Freq: Every day | ORAL | 0 refills | Status: DC

## 2017-08-14 MED ORDER — MECLIZINE HCL 25 MG PO TABS
25.0000 mg | ORAL_TABLET | Freq: Three times a day (TID) | ORAL | Status: DC
  Administered 2017-08-14: 25 mg via ORAL
  Filled 2017-08-14 (×2): qty 1

## 2017-08-14 MED ORDER — ATORVASTATIN CALCIUM 40 MG PO TABS: 40.0000 mg | ORAL_TABLET | Freq: Every day | ORAL | 0 refills | Status: DC

## 2017-08-14 MED ORDER — DOCUSATE SODIUM 100 MG PO CAPS
100.0000 mg | ORAL_CAPSULE | Freq: Two times a day (BID) | ORAL | Status: DC
  Administered 2017-08-14: 100 mg via ORAL
  Filled 2017-08-14: qty 1

## 2017-08-14 MED ORDER — ASPIRIN 81 MG PO CHEW: 81.0000 mg | CHEWABLE_TABLET | Freq: Every day | ORAL | 0 refills | Status: DC

## 2017-08-14 MED ORDER — MECLIZINE HCL 25 MG PO TABS: 25.0000 mg | ORAL_TABLET | Freq: Three times a day (TID) | ORAL | 0 refills | Status: DC

## 2017-08-14 MED ORDER — WARFARIN SODIUM 1 MG PO TABS: 1.5000 mg | ORAL_TABLET | Freq: Every day | ORAL | 11 refills | Status: DC

## 2017-08-14 NOTE — Progress Notes (Addendum)
Report called to [facility] [phone]. Spoke with assigned RN. No questions at pt of discharged. EMS transport to facility. Report provided to EMS service prior to transport.

## 2017-08-14 NOTE — Progress Notes (Signed)
ANTICOAGULATION CONSULT NOTE - Initial Consult  Pharmacy Consult for warfarin Indication: DVT  Allergies  Allergen Reactions  . Amoxicillin Other (See Comments)    Has patient had a PCN reaction causing immediate rash, facial/tongue/throat swelling, SOB or lightheadedness with hypotension: Unknown Has patient had a PCN reaction causing severe rash involving mucus membranes or skin necrosis: Unknown Has patient had a PCN reaction that required hospitalization: Unknown Has patient had a PCN reaction occurring within the last 10 years: Unknown If all of the above answers are "NO", then may proceed with Cephalosporin use.   . Azithromycin     vomiting and stomach upset    Patient Measurements: Height: 5\' 6"  (167.6 cm) Weight: 130 lb 3.2 oz (59.1 kg) IBW/kg (Calculated) : 59.3  Vital Signs: Temp: 99.1 F (37.3 C) (04/03 0505) Temp Source: Oral (04/03 0505) BP: 156/53 (04/03 0505) Pulse Rate: 66 (04/03 0505)  Labs: Recent Labs    08/12/17 0440 08/13/17 0657 08/14/17 0537 08/14/17 0858  HGB  --   --  9.4*  --   HCT  --   --  28.3*  --   PLT  --   --  295  --   LABPROT 13.6 16.1* 27.7* 29.1*  INR 1.05 1.30 2.61 2.78  CREATININE 1.28* 1.21*  --   --     Estimated Creatinine Clearance: 34 mL/min (A) (by C-G formula based on SCr of 1.21 mg/dL (H)).   Medical History: Past Medical History:  Diagnosis Date  . Depression   . Diabetes mellitus without complication ([place])   . History of deep vein thrombosis    about 30 years ago  . Hyperlipidemia 07/06/2005  . Hypertension    Assessment: Pharmacy consulted for warfarin dosing and monitoring in this 84 year old female admitted with CVA. Patient has a history of DVT and has taken warfarin in the past. Patient has chronic DVT and warfarin is being resumed.  Per patient's family - prescriptions are filled at [facility]. Per pharmacy, warfarin 4 mg PO daily was last filled Jan 2018 prescribed by Dr. Rosanna Randy. A  prescription for apixaban was called in but never picked up. Per RN at outpatient doctor's office - INR in July 2018 was 5.7 on warfarin 4 mg PO daily.  Dosing history Date INR Dose 4/1 1 5  mg 4/2 1.3 3 mg 4/3 2.8 HOLD  Goal of Therapy:  INR 2-3 Monitor platelets by anticoagulation protocol: Yes   Plan:  INR = 2.8 is therapeutic but has significantly increased from yesterday (ordered repeat INR to confirm accurate value). Will HOLD warfarin this evening. No significant drug interactions noted. Ketorolac PRN Discontinued. Will recheck INR with AM labs tomorrow. Patient will need to be resumed at a lower dose.   Lenis Noon, PharmD, BCPS Clinical Pharmacist 08/14/2017,9:46 AM

## 2017-08-14 NOTE — Progress Notes (Signed)
Subjective: Patient reports being able to work with therapy today.  Feels she may have gotten some additional benefit from the increased dose of Meclizine.    Objective: Current vital signs: BP (!) 156/53 (BP Location: Right Arm)   Pulse (!) 55   Temp 99.1 F (37.3 C) (Oral)   Resp 16   Ht 5\' 6"  (1.676 m)   Wt 59.1 kg (130 lb 3.2 oz)   SpO2 95%   BMI 21.01 kg/m  Vital signs in last 24 hours: Temp:  [98.5 F (36.9 C)-99.1 F (37.3 C)] 99.1 F (37.3 C) (04/03 0505) Pulse Rate:  [55-67] 55 (04/03 1024) Resp:  [16-20] 16 (04/03 0505) BP: (156-173)/(53-66) 156/53 (04/03 0505) SpO2:  [93 %-98 %] 95 % (04/03 1024)  Intake/Output from previous day: 04/02 0701 - 04/03 0700 In: 240 [P.O.:240] Out: -  Intake/Output this shift: Total I/O In: 240 [P.O.:240] Out: -  Nutritional status: Diet heart healthy/carb modified Room service appropriate? Yes; Fluid consistency: Thin  Neurologic Exam: Mental Status: Awake and alert. Speech fluent without evidence of aphasia. Able to follow 3 step commands without difficulty. Cranial Nerves: II: Discs flat bilaterally; Visual fields grossly normal, pupils equal, round, reactive to light and accommodation III,IV, VI: ptosis not present, extra-ocular motions intact bilaterally V,VII: smile symmetric, facial light touch sensation normal bilaterally VIII: hearing normal bilaterally IX,X: gag reflex present XI: bilateral shoulder shrug XII: midline tongue extension Motor: 5/5 in the upper extremities Sensory: Pinprick and light touch intact throughout, bilaterally Deep Tendon Reflexes:1+ throughout Cerebellar: Dysmetria on L sided   Lab Results: Basic Metabolic Panel: Recent Labs  Lab 08/10/17 1304 08/11/17 0421 08/12/17 0440 08/13/17 0657  NA 135 138 140 141  K 3.0* 3.3* 3.4* 3.4*  CL 102 109 114* 118*  CO2 17* 21* 19* 17*  GLUCOSE 239* 166* 159* 142*  BUN 37* 33* 25* 26*  CREATININE 1.40* 1.30* 1.28* 1.21*  CALCIUM 8.4*  7.5* 7.6* 7.2*    Liver Function Tests: Recent Labs  Lab 08/10/17 1304  AST 25  ALT 17  ALKPHOS 86  BILITOT 0.4  PROT 6.2*  ALBUMIN 2.6*   No results for input(s): LIPASE, AMYLASE in the last 168 hours. No results for input(s): AMMONIA in the last 168 hours.  CBC: Recent Labs  Lab 08/10/17 1304 08/11/17 0421 08/14/17 0537  WBC 12.9* 12.7* 8.6  NEUTROABS 10.6*  --   --   HGB 10.0* 9.1* 9.4*  HCT 30.3* 27.6* 28.3*  MCV 80.9 80.3 80.9  PLT 415 330 295    Cardiac Enzymes: Recent Labs  Lab 08/10/17 1304  TROPONINI <0.03    Lipid Panel: Recent Labs  Lab 08/10/17 1304  CHOL 345*  TRIG 214*  HDL 44  CHOLHDL 7.8  VLDL 43*  LDLCALC 258*    CBG: Recent Labs  Lab 08/13/17 1147 08/13/17 1655 08/13/17 2058 08/14/17 0746 08/14/17 1231  GLUCAP 163* 96 143* 140* 171*    Microbiology: No results found for this or any previous visit.  Coagulation Studies: Recent Labs    08/12/17 0440 08/13/17 0657 08/14/17 0537 08/14/17 0858  LABPROT 13.6 16.1* 27.7* 29.1*  INR 1.05 1.30 2.61 2.78    Imaging: No results found.  Medications:  I have reviewed the patient's current medications. Scheduled: . amLODipine  10 mg Oral Daily  . aspirin EC  325 mg Oral Daily  . atorvastatin  40 mg Oral q1800  . guaiFENesin  600 mg Oral BID   And  . dextromethorphan  30 mg Oral BID  . docusate sodium  100 mg Oral BID  . hydrALAZINE  10 mg Intravenous Once  . insulin aspart  0-5 Units Subcutaneous QHS  . insulin aspart  0-9 Units Subcutaneous TID WC  . lisinopril  10 mg Oral Daily  . meclizine  25 mg Oral TID  . metoprolol tartrate  50 mg Oral BID  . mirtazapine  30 mg Oral QHS  . polyethylene glycol  17 g Oral Daily  . sodium phosphate  1 enema Rectal Once  . Warfarin - Pharmacist Dosing Inpatient   Does not apply q1800    Assessment/Plan: Patient improved today.  Seemed to get benefit from 25mg  Meclizine.    Recommendations: 1.  Meclizine 25mg  q 8 hours    LOS: 4 days   Alexis Goodell, MD Neurology [phone] 08/14/2017  12:39 PM

## 2017-08-14 NOTE — Progress Notes (Signed)
Signed           [] Hide copied text  [] Hover for details   Went back to evaluate patient after receiving 25mg  of Meclizine.  Patient refuses evaluation at this time.    Alexis Goodell, MD Neurology [phone]

## 2017-08-14 NOTE — Progress Notes (Signed)
Physical Therapy Treatment Patient Details Name: Melanie Diaz MRN: [id number] DOB: 28-Nov-1933 Today's Date: 08/14/2017    History of Present Illness Pt admitted for L cerebellar CVA. Pt with history of DVT previously on coumadin, however stopped, HTN, DM. Of note, chronic R LE DVT. Discussed with MD about anticoagulation, stated this was not acute DVT and they are restarting her anticoagulation, ok to evaluate and work with her.    PT Comments    Pt resistant to therapy, but ultimately performs supine bed exercises. Pt denies pain, but continues with complaints of dizziness at all times and especially with movement. Pt able to perform supine bed exercises without physical assist, but is argumentative, as to why she cannot perform. Pt encouraged to do so with education on proper form and control. Encouraged performance 4-5x per day to prevent further decline in strength and function. Pt refuses further mobilization/PT at this time. Continue PT to progress strength, endurance and active participation in mobility to progress function with decreased dizziness.    Follow Up Recommendations  SNF     Equipment Recommendations       Recommendations for Other Services       Precautions / Restrictions Precautions Precautions: Fall Restrictions Weight Bearing Restrictions: No    Mobility  Bed Mobility               General bed mobility comments: Not tested; pt/daughter note pt was recently up to use bedpan and pt complains of increased dizziness with movement and continues so with lying still. Pt refuses further mobility  Transfers                    Ambulation/Gait                 Stairs            Wheelchair Mobility    Modified Rankin (Stroke Patients Only)       Balance                                            Cognition Arousal/Alertness: Awake/alert Behavior During Therapy: WFL for tasks  assessed/performed;Agitated Overall Cognitive Status: Within Functional Limits for tasks assessed                                 General Comments: Pt agitated with therapist during exercises. Argumentative with reasons she cannot perform exercies; however, does ultimately perform      Exercises General Exercises - Lower Extremity Ankle Circles/Pumps: AROM;Left;20 reps;Supine Quad Sets: Strengthening;Both;15 reps;Supine Gluteal Sets: Strengthening;Both;15 reps;Supine Short Arc Quad: AROM;Both;10 reps;Supine Heel Slides: AROM;Both;10 reps;Supine Hip ABduction/ADduction: AROM;Both;10 reps;Supine Straight Leg Raises: Strengthening;Both;10 reps;Supine    General Comments        Pertinent Vitals/Pain Pain Assessment: No/denies pain    Home Living                      Prior Function            PT Goals (current goals can now be found in the care plan section) Progress towards PT goals: Not progressing toward goals - comment    Frequency    7X/week      PT Plan Current plan remains appropriate    Co-evaluation  AM-PAC PT "6 Clicks" Daily Activity  Outcome Measure  Difficulty turning over in bed (including adjusting bedclothes, sheets and blankets)?: Unable Difficulty moving from lying on back to sitting on the side of the bed? : Unable Difficulty sitting down on and standing up from a chair with arms (e.g., wheelchair, bedside commode, etc,.)?: Unable Help needed moving to and from a bed to chair (including a wheelchair)?: Total Help needed walking in hospital room?: Total Help needed climbing 3-5 steps with a railing? : Total 6 Click Score: 6    End of Session   Activity Tolerance: Treatment limited secondary to agitation;Other (comment)(dizziness) Patient left: in bed;with bed alarm set;with family/visitor present;Other (comment)(pt does not want phone on counter is fine; family to stay )   PT Visit Diagnosis: Muscle  weakness (generalized) (M62.81);Pain Pain - Right/Left: Left     Time: 9767-3419 PT Time Calculation (min) (ACUTE ONLY): 24 min  Charges:  $Therapeutic Exercise: 23-37 mins                    G Codes:        Larae Grooms, PTA 08/14/2017, 11:08 AM

## 2017-08-14 NOTE — Discharge Instructions (Signed)
Information on my medicine - Coumadin   (Warfarin)  This medication education was reviewed with me or my healthcare representative as part of my discharge preparation.  The pharmacist that spoke with me during my hospital stay was:  Anette Riedel, [facility]  Why was Coumadin prescribed for you? Coumadin was prescribed for you because you have a blood clot or a medical condition that can cause an increased risk of forming blood clots. Blood clots can cause serious health problems by blocking the flow of blood to the heart, lung, or brain. Coumadin can prevent harmful blood clots from forming. As a reminder your indication for Coumadin is:   Select from menu  What test will check on my response to Coumadin? While on Coumadin (warfarin) you will need to have an INR test regularly to ensure that your dose is keeping you in the desired range. The INR (international normalized ratio) number is calculated from the result of the laboratory test called prothrombin time (PT).  If an INR APPOINTMENT HAS NOT ALREADY BEEN MADE FOR YOU please schedule an appointment to have this lab work done by your health care provider within 7 days. Your INR goal is usually a number between:  2 to 3 or your provider may give you a more narrow range like 2-2.5.  Ask your health care provider during an office visit what your goal INR is.  What  do you need to  know  About  COUMADIN? Take Coumadin (warfarin) exactly as prescribed by your healthcare provider about the same time each day.  DO NOT stop taking without talking to the doctor who prescribed the medication.  Stopping without other blood clot prevention medication to take the place of Coumadin may increase your risk of developing a new clot or stroke.  Get refills before you run out.  What do you do if you miss a dose? If you miss a dose, take it as soon as you remember on the same day then continue your regularly scheduled regimen the next day.  Do not take two doses of  Coumadin at the same time.  Important Safety Information A possible side effect of Coumadin (Warfarin) is an increased risk of bleeding. You should call your healthcare provider right away if you experience any of the following: ? Bleeding from an injury or your nose that does not stop. ? Unusual colored urine (red or dark brown) or unusual colored stools (red or black). ? Unusual bruising for unknown reasons. ? A serious fall or if you hit your head (even if there is no bleeding).  Some foods or medicines interact with Coumadin (warfarin) and might alter your response to warfarin. To help avoid this: ? Eat a balanced diet, maintaining a consistent amount of Vitamin K. ? Notify your provider about major diet changes you plan to make. ? Avoid alcohol or limit your intake to 1 drink for women and 2 drinks for men per day. (1 drink is 5 oz. wine, 12 oz. beer, or 1.5 oz. liquor.)  Make sure that ANY health care provider who prescribes medication for you knows that you are taking Coumadin (warfarin).  Also make sure the healthcare provider who is monitoring your Coumadin knows when you have started a new medication including herbals and non-prescription products.  Coumadin (Warfarin)  Major Drug Interactions  Increased Warfarin Effect Decreased Warfarin Effect  Alcohol (large quantities) Antibiotics (esp. Septra/Bactrim, Flagyl, Cipro) Amiodarone (Cordarone) Aspirin (ASA) Cimetidine (Tagamet) Megestrol (Megace) NSAIDs (ibuprofen, naproxen, etc.) Piroxicam (  Feldene) °Propafenone (Rythmol SR) °Propranolol (Inderal) °Isoniazid (INH) °Posaconazole (Noxafil) Barbiturates (Phenobarbital) °Carbamazepine (Tegretol) °Chlordiazepoxide (Librium) °Cholestyramine (Questran) °Griseofulvin °Oral Contraceptives °Rifampin °Sucralfate (Carafate) °Vitamin K  ° °Coumadin® (Warfarin) Major Herbal Interactions  °Increased Warfarin Effect Decreased Warfarin Effect  °Garlic °Ginseng °Ginkgo biloba Coenzyme Q10 °Green  tea °St. John’s wort   ° °Coumadin® (Warfarin) FOOD Interactions  °Eat a consistent number of servings per week of foods HIGH in Vitamin K °(1 serving = ½ cup)  °Collards (cooked, or boiled & drained) °Kale (cooked, or boiled & drained) °Mustard greens (cooked, or boiled & drained) °Parsley *serving size only = ¼ cup °Spinach (cooked, or boiled & drained) °Swiss chard (cooked, or boiled & drained) °Turnip greens (cooked, or boiled & drained)  °Eat a consistent number of servings per week of foods MEDIUM-HIGH in Vitamin K °(1 serving = 1 cup)  °Asparagus (cooked, or boiled & drained) °Broccoli (cooked, boiled & drained, or raw & chopped) °Brussel sprouts (cooked, or boiled & drained) *serving size only = ½ cup °Lettuce, raw (green leaf, endive, romaine) °Spinach, raw °Turnip greens, raw & chopped  ° °These websites have more information on Coumadin (warfarin):  www.coumadin.com; °www.ahrq.gov/consumer/coumadin.htm; ° ° ° °

## 2017-08-14 NOTE — Clinical Social Work Note (Signed)
Patient to be d/c'ed today to [facility].  Patient and family agreeable to plans will transport via ems RN to call report to [phone].  Patient's daughter was at bedside, and is aware that patient is discharging today.  Evette Cristal, MSW, [place]

## 2017-08-14 NOTE — Clinical Social Work Placement (Signed)
   CLINICAL SOCIAL WORK PLACEMENT  NOTE  Date:  08/14/2017  Patient Details  Name: Melanie Diaz MRN: [id number] Date of Birth: 07-08-1933  Clinical Social Work is seeking post-discharge placement for this patient at the [place] level of care (*CSW will initial, date and re-position this form in  chart as items are completed):  Yes   Patient/family provided with [place] Work Department's list of facilities offering this level of care within the geographic area requested by the patient (or if unable, by the patient's family).  Yes   Patient/family informed of their freedom to choose among providers that offer the needed level of care, that participate in Medicare, Medicaid or managed care program needed by the patient, have an available bed and are willing to accept the patient.  Yes   Patient/family informed of [facility]'s ownership interest in [facility] and [facility], as well as of the fact that they are under no obligation to receive care at these facilities.  PASRR submitted to EDS on 08/13/17     PASRR number received on       Existing PASRR number confirmed on 08/13/17     FL2 transmitted to all facilities in geographic area requested by pt/family on 08/13/17     FL2 transmitted to all facilities within larger geographic area on       Patient informed that his/her managed care company has contracts with or will negotiate with certain facilities, including the following:        Yes   Patient/family informed of bed offers received.  Patient chooses bed at [facility]     Physician recommends and patient chooses bed at      Patient to be transferred to [facility] on 08/14/17.  Patient to be transferred to facility by [facility]     Patient family notified on 08/14/17 of transfer.  Name of family member notified:  Loistine Chance     PHYSICIAN       Additional Comment:     _______________________________________________ Ross Ludwig, [place] 08/14/2017, 12:02 PM

## 2017-08-14 NOTE — Discharge Summary (Signed)
[facility] NAME: Melanie Diaz    MR#:  [id number]  DATE OF BIRTH:  Oct 11, 1933  DATE OF ADMISSION:  08/10/2017 ADMITTING PHYSICIAN: Gladstone Lighter, MD  DATE OF DISCHARGE: 08/14/2017  PRIMARY CARE PHYSICIAN: [name]., MD    ADMISSION DIAGNOSIS:  Swelling [R60.9] Cerebellar infarction ([place]) [I63.9] Stroke (cerebrum) ([place]) [I63.9]  DISCHARGE DIAGNOSIS:  Active Problems:   Stroke (cerebrum) ([place])   SECONDARY DIAGNOSIS:   Past Medical History:  Diagnosis Date  . Depression   . Diabetes mellitus without complication ([place])   . History of deep vein thrombosis    about 30 years ago  . Hyperlipidemia 07/06/2005  . Hypertension     HOSPITAL COURSE:   84 year old female with history of DVT who is on Coumadin and did not tolerate Eliquis, diabetes and essential hypertension who presented with dizziness.   1.Acute to early subacute left cerebellar infarcts with Occlusion of the distal left vertebral artery and Intracranial atherosclerosis including moderate basilar artery stenosis, severe left SCA stenosis, and moderate right and mild left ICA stenosis:  Carotid Doppler showing 50-69% bilateral stenosis  Patient will continue on Coumadin for chronic DVTs.  She will continue aspirin and statin.   Physical therapy is recommended skilled nursing facility upon discharge.  Symptoms of nausea are due to the cerebellar infarct.  She will continue meclizine for nausea and vertigo Repeat CT head shows left cerebellar, PCA distribution, infarct has evolved as  expected since the studies performed 2 days ago. It is better  defined associated with mild mass effect. It is consistent with a  subacute infarct   2. Cannulized chronic deep venous thrombosis in the right common femoral vein, right saphenofemoral junction and right profunda femoral vein   She needs an INR checked tomorrow.  Her INR at discharge is 2.8.  She will  be discharged on Coumadin 1.5 mg daily.  3. Diabetes: We will continue with ADA diet and glipizide.  Metformin has been discontinued due to poor p.o. intake.    4. Essential hypertension: She will continue outpatient regimen of hypertensive medications.    5. Depression: Continue Remeron    DISCHARGE CONDITIONS AND DIET:   Stable for discharge on diabetic heart healthy diet  CONSULTS OBTAINED:  Treatment Team:  Leotis Pain, MD  DRUG ALLERGIES:   Allergies  Allergen Reactions  . Amoxicillin Other (See Comments)    Has patient had a PCN reaction causing immediate rash, facial/tongue/throat swelling, SOB or lightheadedness with hypotension: Unknown Has patient had a PCN reaction causing severe rash involving mucus membranes or skin necrosis: Unknown Has patient had a PCN reaction that required hospitalization: Unknown Has patient had a PCN reaction occurring within the last 10 years: Unknown If all of the above answers are "NO", then may proceed with Cephalosporin use.   . Azithromycin     vomiting and stomach upset    DISCHARGE MEDICATIONS:   Allergies as of 08/14/2017      Reactions   Amoxicillin Other (See Comments)   Has patient had a PCN reaction causing immediate rash, facial/tongue/throat swelling, SOB or lightheadedness with hypotension: Unknown Has patient had a PCN reaction causing severe rash involving mucus membranes or skin necrosis: Unknown Has patient had a PCN reaction that required hospitalization: Unknown Has patient had a PCN reaction occurring within the last 10 years: Unknown If all of the above answers are "NO", then may proceed with Cephalosporin use.   Azithromycin  vomiting and stomach upset      Medication List    STOP taking these medications   apixaban 5 MG Tabs tablet Commonly known as:  ELIQUIS   doxycycline 100 MG capsule Commonly known as:  VIBRAMYCIN   doxycycline 100 MG tablet Commonly known as:  VIBRA-TABS    hydrochlorothiazide 25 MG tablet Commonly known as:  HYDRODIURIL   metFORMIN 500 MG tablet Commonly known as:  GLUCOPHAGE   mirtazapine 30 MG tablet Commonly known as:  REMERON   ondansetron 4 MG tablet Commonly known as:  ZOFRAN   ZYRTEC ALLERGY 10 MG Caps Generic drug:  Cetirizine HCl     TAKE these medications   amLODipine 10 MG tablet Commonly known as:  NORVASC Take 1 tablet (10 mg total) by mouth daily. What changed:  how much to take   aspirin 81 MG chewable tablet Commonly known as:  ASPIRIN CHILDRENS Chew 1 tablet (81 mg total) by mouth daily.   atorvastatin 40 MG tablet Commonly known as:  LIPITOR Take 1 tablet (40 mg total) by mouth daily at 6 PM.   gabapentin 300 MG capsule Commonly known as:  NEURONTIN TAKE (1) CAPSULE TWICE DAILY AS NEEDED.   glipiZIDE 10 MG tablet Commonly known as:  GLUCOTROL Take 1 tablet (10 mg total) by mouth daily. What changed:  Another medication with the same name was removed. Continue taking this medication, and follow the directions you see here.   lisinopril 10 MG tablet Commonly known as:  PRINIVIL,ZESTRIL Take 1 tablet (10 mg total) by mouth daily.   meclizine 25 MG tablet Commonly known as:  ANTIVERT Take 1 tablet (25 mg total) by mouth 3 (three) times daily.   metoprolol tartrate 50 MG tablet Commonly known as:  LOPRESSOR Take 1 tablet (50 mg total) by mouth 2 (two) times daily. What changed:  Another medication with the same name was removed. Continue taking this medication, and follow the directions you see here.   polyethylene glycol packet Commonly known as:  MIRALAX / GLYCOLAX Take 17 g by mouth daily.   warfarin 1 MG tablet Commonly known as:  COUMADIN Take as directed. If you are unsure how to take this medication, talk to your nurse or doctor. Original instructions:  Take 1.5 tablets (1.5 mg total) by mouth daily at 6 PM.         Today   CHIEF COMPLAINT:   She was able to eat breakfast this  morning.  Has some nausea and vertigo but improved   VITAL SIGNS:  Blood pressure (!) 156/53, pulse (!) 55, temperature 99.1 F (37.3 C), temperature source Oral, resp. rate 16, height 5\' 6"  (1.676 m), weight 59.1 kg (130 lb 3.2 oz), SpO2 95 %.   REVIEW OF SYSTEMS:  Review of Systems  Constitutional: Negative.  Negative for chills, fever and malaise/fatigue.  HENT: Negative.  Negative for ear discharge, ear pain, hearing loss, nosebleeds and sore throat.   Eyes: Negative.  Negative for blurred vision and pain.  Respiratory: Negative.  Negative for cough, hemoptysis, shortness of breath and wheezing.   Cardiovascular: Negative.  Negative for chest pain, palpitations and leg swelling.  Gastrointestinal: Positive for nausea. Negative for abdominal pain, blood in stool, diarrhea and vomiting.  Genitourinary: Negative.  Negative for dysuria.  Musculoskeletal: Negative.  Negative for back pain.  Skin: Negative.   Neurological: Positive for dizziness. Negative for tremors, speech change, focal weakness, seizures and headaches.  Endo/Heme/Allergies: Negative.  Does not bruise/bleed easily.  Psychiatric/Behavioral:  Negative.  Negative for depression, hallucinations and suicidal ideas.     PHYSICAL EXAMINATION:  GENERAL:  84 y.o.-year-old patient lying in the bed with no acute distress.  NECK:  Supple, no jugular venous distention. No thyroid enlargement, no tenderness.  LUNGS: Normal breath sounds bilaterally, no wheezing, rales,rhonchi  No use of accessory muscles of respiration.  CARDIOVASCULAR: S1, S2 normal. No murmurs, rubs, or gallops.  ABDOMEN: Soft, non-tender, non-distended. Bowel sounds present. No organomegaly or mass.  EXTREMITIES: No pedal edema, cyanosis, or clubbing.  Right leg AKA PSYCHIATRIC: The patient is alert and oriented x 3.  SKIN: No obvious rash, lesion, or ulcer.   DATA REVIEW:   CBC Recent Labs  Lab 08/14/17 0537  WBC 8.6  HGB 9.4*  HCT 28.3*  PLT 295     Chemistries  Recent Labs  Lab 08/10/17 1304  08/13/17 0657  NA 135   < > 141  K 3.0*   < > 3.4*  CL 102   < > 118*  CO2 17*   < > 17*  GLUCOSE 239*   < > 142*  BUN 37*   < > 26*  CREATININE 1.40*   < > 1.21*  CALCIUM 8.4*   < > 7.2*  AST 25  --   --   ALT 17  --   --   ALKPHOS 86  --   --   BILITOT 0.4  --   --    < > = values in this interval not displayed.    Cardiac Enzymes Recent Labs  Lab 08/10/17 1304  TROPONINI <0.03    Microbiology Results  @MICRORSLT48 @  RADIOLOGY:  No results found.    Allergies as of 08/14/2017      Reactions   Amoxicillin Other (See Comments)   Has patient had a PCN reaction causing immediate rash, facial/tongue/throat swelling, SOB or lightheadedness with hypotension: Unknown Has patient had a PCN reaction causing severe rash involving mucus membranes or skin necrosis: Unknown Has patient had a PCN reaction that required hospitalization: Unknown Has patient had a PCN reaction occurring within the last 10 years: Unknown If all of the above answers are "NO", then may proceed with Cephalosporin use.   Azithromycin    vomiting and stomach upset      Medication List    STOP taking these medications   apixaban 5 MG Tabs tablet Commonly known as:  ELIQUIS   doxycycline 100 MG capsule Commonly known as:  VIBRAMYCIN   doxycycline 100 MG tablet Commonly known as:  VIBRA-TABS   hydrochlorothiazide 25 MG tablet Commonly known as:  HYDRODIURIL   metFORMIN 500 MG tablet Commonly known as:  GLUCOPHAGE   mirtazapine 30 MG tablet Commonly known as:  REMERON   ondansetron 4 MG tablet Commonly known as:  ZOFRAN   ZYRTEC ALLERGY 10 MG Caps Generic drug:  Cetirizine HCl     TAKE these medications   amLODipine 10 MG tablet Commonly known as:  NORVASC Take 1 tablet (10 mg total) by mouth daily. What changed:  how much to take   aspirin 81 MG chewable tablet Commonly known as:  ASPIRIN CHILDRENS Chew 1 tablet (81 mg total)  by mouth daily.   atorvastatin 40 MG tablet Commonly known as:  LIPITOR Take 1 tablet (40 mg total) by mouth daily at 6 PM.   gabapentin 300 MG capsule Commonly known as:  NEURONTIN TAKE (1) CAPSULE TWICE DAILY AS NEEDED.   glipiZIDE 10 MG tablet Commonly known as:  GLUCOTROL Take 1 tablet (10 mg total) by mouth daily. What changed:  Another medication with the same name was removed. Continue taking this medication, and follow the directions you see here.   lisinopril 10 MG tablet Commonly known as:  PRINIVIL,ZESTRIL Take 1 tablet (10 mg total) by mouth daily.   meclizine 25 MG tablet Commonly known as:  ANTIVERT Take 1 tablet (25 mg total) by mouth 3 (three) times daily.   metoprolol tartrate 50 MG tablet Commonly known as:  LOPRESSOR Take 1 tablet (50 mg total) by mouth 2 (two) times daily. What changed:  Another medication with the same name was removed. Continue taking this medication, and follow the directions you see here.   polyethylene glycol packet Commonly known as:  MIRALAX / GLYCOLAX Take 17 g by mouth daily.   warfarin 1 MG tablet Commonly known as:  COUMADIN Take as directed. If you are unsure how to take this medication, talk to your nurse or doctor. Original instructions:  Take 1.5 tablets (1.5 mg total) by mouth daily at 6 PM.          Management plans discussed with the patient and daughter and they are in agreement. Stable for discharge snf  Patient should follow up with pcp  CODE STATUS:     Code Status Orders  (From admission, onward)        Start     Ordered   08/10/17 1643  Full code  Continuous     08/10/17 1643    Code Status History    This patient has a current code status but no historical code status.      TOTAL TIME TAKING CARE OF THIS PATIENT: 38 minutes.    Note: This dictation was prepared with Dragon dictation along with smaller phrase technology. Any transcriptional errors that result from this process are  unintentional.  Emmely Bittinger M.D on 08/14/2017 at 11:21 AM  Between 7am to 6pm - Pager - [phone] After 6pm go to [email or web address] - password [email or web address] [facility]  Office  [phone]  CC: Primary care physician; [name]., MD

## 2017-08-15 DIAGNOSIS — I825Y9 Chronic embolism and thrombosis of unspecified deep veins of unspecified proximal lower extremity: Secondary | ICD-10-CM | POA: Insufficient documentation

## 2017-08-15 DIAGNOSIS — Z8673 Personal history of transient ischemic attack (TIA), and cerebral infarction without residual deficits: Secondary | ICD-10-CM | POA: Diagnosis not present

## 2017-08-15 DIAGNOSIS — E119 Type 2 diabetes mellitus without complications: Secondary | ICD-10-CM | POA: Diagnosis not present

## 2017-08-15 DIAGNOSIS — F325 Major depressive disorder, single episode, in full remission: Secondary | ICD-10-CM | POA: Insufficient documentation

## 2017-08-15 NOTE — Telephone Encounter (Signed)
Visit complete.

## 2017-08-26 DIAGNOSIS — I10 Essential (primary) hypertension: Secondary | ICD-10-CM | POA: Diagnosis not present

## 2017-08-26 DIAGNOSIS — I639 Cerebral infarction, unspecified: Secondary | ICD-10-CM | POA: Diagnosis not present

## 2017-08-26 DIAGNOSIS — E1169 Type 2 diabetes mellitus with other specified complication: Secondary | ICD-10-CM | POA: Diagnosis not present

## 2017-08-26 DIAGNOSIS — R110 Nausea: Secondary | ICD-10-CM | POA: Diagnosis not present

## 2017-09-02 DIAGNOSIS — E1169 Type 2 diabetes mellitus with other specified complication: Secondary | ICD-10-CM | POA: Diagnosis not present

## 2017-09-02 DIAGNOSIS — I10 Essential (primary) hypertension: Secondary | ICD-10-CM | POA: Diagnosis not present

## 2017-09-02 DIAGNOSIS — R110 Nausea: Secondary | ICD-10-CM | POA: Diagnosis not present

## 2017-09-02 DIAGNOSIS — I639 Cerebral infarction, unspecified: Secondary | ICD-10-CM | POA: Diagnosis not present

## 2017-09-03 ENCOUNTER — Telehealth: Payer: Self-pay

## 2017-09-03 NOTE — Telephone Encounter (Signed)
Patient's granddaughter Primitivo Gauze is requesting a refill on Scopolamine patches 1.5mg  for dizziness be sent to [facility]. Patient was released from [facility] today. She was using the patches there, but ran out.    CB#[phone]

## 2017-09-04 MED ORDER — SCOPOLAMINE 1 MG/3DAYS TD PT72: 1.0000 | MEDICATED_PATCH | TRANSDERMAL | 5 refills | Status: DC

## 2017-09-04 NOTE — Telephone Encounter (Signed)
1 patch every 3 day as needed--box of 4 with 5 rf. Also needs to set up f/u appt as she has been extremely noncompliant in past.

## 2017-09-06 ENCOUNTER — Telehealth: Payer: Self-pay | Admitting: Family Medicine

## 2017-09-06 DIAGNOSIS — R42 Dizziness and giddiness: Secondary | ICD-10-CM

## 2017-09-06 MED ORDER — DIAZEPAM 2 MG PO TABS: 1.0000 mg | ORAL_TABLET | Freq: Four times a day (QID) | ORAL | 0 refills | Status: DC | PRN

## 2017-09-06 NOTE — Telephone Encounter (Signed)
Patient's vertigo is not getting any better on the Meclizine and the hospitalist mentioned Valium to the family.   Wants to talk to a nurse regarding this today.

## 2017-09-06 NOTE — Telephone Encounter (Signed)
Angie advised

## 2017-09-06 NOTE — Telephone Encounter (Signed)
Pt's daughter states her mom's medication for dizziness isn't working how it should.  States she was released from the hospital a week or so ago with a light stroke.  States she would just like to speak with a nurse.

## 2017-09-06 NOTE — Telephone Encounter (Signed)
Valium sent in. Will make her drowsy and increase fall risk.

## 2017-09-06 NOTE — Telephone Encounter (Signed)
Melanie Diaz states that the Meclizine is somewhat improving sx, but this is far from resolved. She states the hospitalist advised her that Valium is the next step if Meclizine has not worked. She is asking for an rx of this, if this is appropriate. She is aware Dr. Rosanna Randy is out of the office, but would like this taken care of before the weekend.

## 2017-09-10 ENCOUNTER — Telehealth: Payer: Self-pay | Admitting: Family Medicine

## 2017-09-10 NOTE — Telephone Encounter (Signed)
Melanie Diaz with [facility] stated that she contacted pt and pt refused home health care. Please advise. Thanks TNP

## 2017-09-10 NOTE — Telephone Encounter (Signed)
LMTCB

## 2017-09-10 NOTE — Telephone Encounter (Signed)
Ok but pt needs hospital f/u as she has always been very noncompliant.

## 2017-09-16 NOTE — Telephone Encounter (Signed)
LMTCB ED 

## 2017-09-16 NOTE — Telephone Encounter (Signed)
Pt returned call

## 2017-09-17 DIAGNOSIS — E113413 Type 2 diabetes mellitus with severe nonproliferative diabetic retinopathy with macular edema, bilateral: Secondary | ICD-10-CM | POA: Diagnosis not present

## 2017-09-18 DIAGNOSIS — I639 Cerebral infarction, unspecified: Secondary | ICD-10-CM | POA: Diagnosis not present

## 2017-09-18 DIAGNOSIS — R42 Dizziness and giddiness: Secondary | ICD-10-CM | POA: Diagnosis not present

## 2017-09-18 NOTE — Telephone Encounter (Signed)
Note left on hospital f/u 10/03/17. -MM

## 2017-10-03 ENCOUNTER — Inpatient Hospital Stay: Payer: Self-pay | Admitting: Family Medicine

## 2017-10-14 ENCOUNTER — Ambulatory Visit (INDEPENDENT_AMBULATORY_CARE_PROVIDER_SITE_OTHER): Payer: Medicare Other | Admitting: Family Medicine

## 2017-10-14 ENCOUNTER — Ambulatory Visit: Payer: Medicare Other | Admitting: Family Medicine

## 2017-10-14 ENCOUNTER — Encounter: Payer: Self-pay | Admitting: Family Medicine

## 2017-10-14 VITALS — BP 124/52 | HR 46 | Temp 97.6°F | Resp 14 | Wt 117.0 lb

## 2017-10-14 DIAGNOSIS — Z09 Encounter for follow-up examination after completed treatment for conditions other than malignant neoplasm: Secondary | ICD-10-CM | POA: Diagnosis not present

## 2017-10-14 DIAGNOSIS — R42 Dizziness and giddiness: Secondary | ICD-10-CM

## 2017-10-14 DIAGNOSIS — T879 Unspecified complications of amputation stump: Secondary | ICD-10-CM | POA: Diagnosis not present

## 2017-10-14 DIAGNOSIS — I10 Essential (primary) hypertension: Secondary | ICD-10-CM | POA: Diagnosis not present

## 2017-10-14 DIAGNOSIS — I639 Cerebral infarction, unspecified: Secondary | ICD-10-CM | POA: Diagnosis not present

## 2017-10-14 DIAGNOSIS — E1169 Type 2 diabetes mellitus with other specified complication: Secondary | ICD-10-CM | POA: Diagnosis not present

## 2017-10-14 DIAGNOSIS — I82402 Acute embolism and thrombosis of unspecified deep veins of left lower extremity: Secondary | ICD-10-CM

## 2017-10-14 LAB — POCT INR
INR: 2.4 (ref 2.0–3.0)
PT: 28.9

## 2017-10-14 NOTE — Progress Notes (Signed)
Patient: Melanie Diaz Female    DOB: Dec 22, 1933   84 y.o.   MRN: [id number] Visit Date: 10/14/2017  Today's Provider: Wilhemena Durie, MD   Chief Complaint  Patient presents with  . Hospitalization Follow-up   Subjective:    HPI Hospital stay: 08/10/17- 08/14/17  Admission dx: swelling, Cerebellar infarction, stroke Discharge diagnosis: Stroke CT showed left cerebellar, PCA distribution, infarct has evolved as expected. Pt to continue ASA and statin.   Pt still having dizziness and nausea when leaving the hospital. She we d/c'd to SNF. She was using the scopolamine patches at [facility], pt was also changed to Valium from Meclizine as well.   Pt reports that she feels alright most of the time, she gets a headache and she has to take a valium and it gets better. She is wearing the scopolamine patches. She does not have any strength in her legs and arm. She did PT at [facility].      Allergies  Allergen Reactions  . Amoxicillin Other (See Comments)    Has patient had a PCN reaction causing immediate rash, facial/tongue/throat swelling, SOB or lightheadedness with hypotension: Unknown Has patient had a PCN reaction causing severe rash involving mucus membranes or skin necrosis: Unknown Has patient had a PCN reaction that required hospitalization: Unknown Has patient had a PCN reaction occurring within the last 10 years: Unknown If all of the above answers are "NO", then may proceed with Cephalosporin use.   . Azithromycin     vomiting and stomach upset     Current Outpatient Medications:  .  amLODipine (NORVASC) 10 MG tablet, Take 1 tablet (10 mg total) by mouth daily. (Patient taking differently: Take 5 mg by mouth daily. ), Disp: 30 tablet, Rfl: 5 .  aspirin (ASPIRIN CHILDRENS) 81 MG chewable tablet, Chew 1 tablet (81 mg total) by mouth daily., Disp: 120 tablet, Rfl: 0 .  atorvastatin (LIPITOR) 40 MG tablet, Take 1 tablet (40 mg total) by mouth daily at 6  PM., Disp: 30 tablet, Rfl: 0 .  diazepam (VALIUM) 2 MG tablet, Take 0.5-1 tablets (1-2 mg total) by mouth every 6 (six) hours as needed for anxiety., Disp: 30 tablet, Rfl: 0 .  glipiZIDE (GLUCOTROL) 10 MG tablet, Take 1 tablet (10 mg total) by mouth daily., Disp: 30 tablet, Rfl: 5 .  metoprolol (LOPRESSOR) 50 MG tablet, Take 1 tablet (50 mg total) by mouth 2 (two) times daily., Disp: 180 tablet, Rfl: 1 .  scopolamine (TRANSDERM-SCOP) 1 MG/3DAYS, Place 1 patch (1.5 mg total) onto the skin every 3 (three) days., Disp: 4 patch, Rfl: 5 .  warfarin (COUMADIN) 1 MG tablet, Take 1.5 tablets (1.5 mg total) by mouth daily at 6 PM., Disp: 45 tablet, Rfl: 11 .  gabapentin (NEURONTIN) 300 MG capsule, TAKE (1) CAPSULE TWICE DAILY AS NEEDED. (Patient not taking: Reported on 10/14/2017), Disp: 180 capsule, Rfl: 0 .  lisinopril (PRINIVIL,ZESTRIL) 10 MG tablet, Take 1 tablet (10 mg total) by mouth daily. (Patient not taking: Reported on 10/14/2017), Disp: 90 tablet, Rfl: 3 .  meclizine (ANTIVERT) 25 MG tablet, Take 1 tablet (25 mg total) by mouth 3 (three) times daily. (Patient not taking: Reported on 10/14/2017), Disp: 30 tablet, Rfl: 0 .  polyethylene glycol (MIRALAX / GLYCOLAX) packet, Take 17 g by mouth daily. (Patient not taking: Reported on 10/14/2017), Disp: 14 each, Rfl: 0  Review of Systems  Constitutional: Negative.   HENT: Negative.   Eyes: Negative.  Respiratory: Negative.   Cardiovascular: Negative.   Gastrointestinal: Positive for nausea.  Endocrine: Negative.   Genitourinary: Negative.   Musculoskeletal: Negative.   Skin: Negative.   Neurological: Positive for dizziness and weakness.  Hematological: Negative.   Psychiatric/Behavioral: Negative.     Social History   Tobacco Use  . Smoking status: Never Smoker  . Smokeless tobacco: Never Used  Substance Use Topics  . Alcohol use: No   Objective:   BP (!) 124/52 (BP Location: Left Arm, Patient Position: Sitting, Cuff Size: Normal)   Pulse  (!) 46   Temp 97.6 F (36.4 C) (Oral)   Resp 14   Wt 117 lb (53.1 kg) Comment: pt could not stand alone on scale she had to hold on.  SpO2 98%   BMI 18.88 kg/m  Vitals:   10/14/17 1446  BP: (!) 124/52  Pulse: (!) 46  Resp: 14  Temp: 97.6 F (36.4 C)  TempSrc: Oral  SpO2: 98%  Weight: 117 lb (53.1 kg)     Physical Exam  Constitutional: She is oriented to person, place, and time. She appears well-developed and well-nourished.  HENT:  Head: Normocephalic and atraumatic.  Right Ear: External ear normal.  Left Ear: External ear normal.  Nose: Nose normal.  Eyes: Conjunctivae are normal. No scleral icterus.  Neck: No thyromegaly present.  Cardiovascular: Normal rate, regular rhythm and normal heart sounds.  Pulmonary/Chest: Effort normal and breath sounds normal.  Abdominal: Soft.  Musculoskeletal: She exhibits no edema.  Neurological: She is alert and oriented to person, place, and time.  Skin: Skin is warm and dry.  Psychiatric: She has a normal mood and affect. Her behavior is normal. Judgment and thought content normal.        Assessment & Plan:     1. Hospital discharge follow-up Pt slowly improving.  2. Cerebrovascular accident (CVA), unspecified mechanism ([place])   3. Deep vein thrombosis (DVT) of left lower extremity, unspecified chronicity, unspecified vein (HCC) Lifelong coumadin but pt has been very noncompliant with f/u and INR. Discussed possible Xarelto. - POCT INR--2.4 today.  4. Type 2 diabetes mellitus with other specified complication, without long-term current use of insulin (HCC)  - Comprehensive metabolic panel  5. BKA stump complication ([place]) Stump OK.  6. Dizziness Improving.  7. Essential hypertension Control tightly if possible. - CBC with Differential/Platelet - TSH 8.Bradycardia     I have done the exam and reviewed the above chart and it is accurate to the best of my knowledge. Development worker, community has been used in this note in  any air is in the dictation or transcription are unintentional.  Wilhemena Durie, MD  [place]

## 2017-10-15 LAB — CBC WITH DIFFERENTIAL/PLATELET
BASOS ABS: 0.1 10*3/uL (ref 0.0–0.2)
Basos: 1 %
EOS (ABSOLUTE): 0.3 10*3/uL (ref 0.0–0.4)
EOS: 3 %
Hematocrit: 26.8 % — ABNORMAL LOW (ref 34.0–46.6)
Hemoglobin: 8.9 g/dL — ABNORMAL LOW (ref 11.1–15.9)
IMMATURE GRANS (ABS): 0 10*3/uL (ref 0.0–0.1)
IMMATURE GRANULOCYTES: 0 %
Lymphocytes Absolute: 3 10*3/uL (ref 0.7–3.1)
Lymphs: 35 %
MCH: 27.2 pg (ref 26.6–33.0)
MCHC: 33.2 g/dL (ref 31.5–35.7)
MCV: 82 fL (ref 79–97)
MONOCYTES: 7 %
MONOS ABS: 0.6 10*3/uL (ref 0.1–0.9)
Neutrophils Absolute: 4.7 10*3/uL (ref 1.4–7.0)
Neutrophils: 54 %
Platelets: 374 10*3/uL (ref 150–450)
RBC: 3.27 x10E6/uL — ABNORMAL LOW (ref 3.77–5.28)
RDW: 15.7 % — AB (ref 12.3–15.4)
WBC: 8.6 10*3/uL (ref 3.4–10.8)

## 2017-10-15 LAB — COMPREHENSIVE METABOLIC PANEL
A/G RATIO: 1 — AB (ref 1.2–2.2)
ALK PHOS: 80 IU/L (ref 39–117)
ALT: 10 IU/L (ref 0–32)
AST: 13 IU/L (ref 0–40)
Albumin: 2.6 g/dL — ABNORMAL LOW (ref 3.5–4.7)
BUN/Creatinine Ratio: 21 (ref 12–28)
BUN: 22 mg/dL (ref 8–27)
Bilirubin Total: <0.2 mg/dL (ref 0.0–1.2)
CALCIUM: 8.5 mg/dL — AB (ref 8.7–10.3)
CO2: 22 mmol/L (ref 20–29)
CREATININE: 1.03 mg/dL — AB (ref 0.57–1.00)
Chloride: 106 mmol/L (ref 96–106)
GFR calc Af Amer: 59 mL/min/{1.73_m2} — ABNORMAL LOW (ref >59)
GFR, EST NON AFRICAN AMERICAN: 51 mL/min/{1.73_m2} — AB (ref >59)
GLOBULIN, TOTAL: 2.6 g/dL (ref 1.5–4.5)
Glucose: 148 mg/dL — ABNORMAL HIGH (ref 65–99)
POTASSIUM: 5.2 mmol/L (ref 3.5–5.2)
SODIUM: 138 mmol/L (ref 134–144)
Total Protein: 5.2 g/dL — ABNORMAL LOW (ref 6.0–8.5)

## 2017-10-15 LAB — TSH: TSH: 5.22 u[IU]/mL — AB (ref 0.450–4.500)

## 2017-10-18 NOTE — Progress Notes (Signed)
Advised  ED 

## 2017-10-22 ENCOUNTER — Other Ambulatory Visit: Payer: Self-pay | Admitting: Family Medicine

## 2017-10-22 DIAGNOSIS — R42 Dizziness and giddiness: Secondary | ICD-10-CM

## 2017-10-22 DIAGNOSIS — I10 Essential (primary) hypertension: Secondary | ICD-10-CM

## 2017-10-22 NOTE — Telephone Encounter (Signed)
Pt's daughter called saying mom was in to see Dr. Darnell Level last week for a FU from hospital and rehab.  She thought Dr. Rosanna Randy was going to send refills in to the pharmacy for mom's meds.  She went to the pharmacy yesterday and they told her they haven't received anything.  She said they were suppose to fax request for the refills she needs.  She needs refill on   potassium 10 meq Coumadin 2 mg Cozaar 25 mg amlodipine 10 mg Glipizide 10mg  Atorvastatin 40mg  Metoprolol 50mg  gabapintin 300 mg  They gave her Valium 2mg  for vertigo  Daughters call back Is  [place]

## 2017-10-22 NOTE — Telephone Encounter (Signed)
Please advise. Thanks.  

## 2017-10-23 ENCOUNTER — Telehealth: Payer: Self-pay | Admitting: Family Medicine

## 2017-10-24 MED ORDER — AMLODIPINE BESYLATE 10 MG PO TABS: 10.0000 mg | ORAL_TABLET | Freq: Every day | ORAL | 5 refills | Status: DC

## 2017-10-24 MED ORDER — GLIPIZIDE 10 MG PO TABS: 10.0000 mg | ORAL_TABLET | Freq: Every day | ORAL | 5 refills | Status: DC

## 2017-10-24 MED ORDER — GABAPENTIN 300 MG PO CAPS: 300.0000 mg | ORAL_CAPSULE | Freq: Two times a day (BID) | ORAL | 0 refills | Status: DC

## 2017-10-24 MED ORDER — DIAZEPAM 2 MG PO TABS
1.0000 mg | ORAL_TABLET | Freq: Four times a day (QID) | ORAL | 2 refills | Status: DC | PRN
Start: 2017-10-24 — End: 2018-01-31

## 2017-10-24 MED ORDER — ATORVASTATIN CALCIUM 40 MG PO TABS
40.0000 mg | ORAL_TABLET | Freq: Every day | ORAL | 0 refills | Status: DC
Start: 2017-10-24 — End: 2017-11-28

## 2017-10-24 MED ORDER — METOPROLOL TARTRATE 50 MG PO TABS: 50.0000 mg | ORAL_TABLET | Freq: Two times a day (BID) | ORAL | 1 refills | Status: DC

## 2017-10-28 ENCOUNTER — Other Ambulatory Visit: Payer: Self-pay

## 2017-10-28 NOTE — Patient Outreach (Signed)
[facility]) Care Management  10/28/2017  Melanie Diaz 05-Aug-1933 [id number]   Medication Adherence call to Mrs. Melanie Diaz spoke with patient sister she said they pick up all her medication on 6/13 form [facility] patient is no longer taking Losartan 20 mg she is continuing with Glipizide 10 mg and Atorvastatin 40 mg.[name] is showing past due under [place].   [place] Management Direct Dial [phone]  Fax [phone] [email or web address]

## 2017-10-28 NOTE — Telephone Encounter (Signed)
NANM

## 2017-10-29 DIAGNOSIS — E113413 Type 2 diabetes mellitus with severe nonproliferative diabetic retinopathy with macular edema, bilateral: Secondary | ICD-10-CM | POA: Diagnosis not present

## 2017-11-03 NOTE — Telephone Encounter (Signed)
Completed.

## 2017-11-08 ENCOUNTER — Telehealth: Payer: Self-pay | Admitting: Family Medicine

## 2017-11-08 NOTE — Telephone Encounter (Signed)
I called the pt to schedule AWV, but a female said that she wasn't in.  I asked her to have the pt call and schedule AWV. VDM (DD)

## 2017-11-11 ENCOUNTER — Other Ambulatory Visit: Payer: Self-pay | Admitting: Family Medicine

## 2017-11-12 ENCOUNTER — Telehealth: Payer: Self-pay

## 2017-11-12 NOTE — Telephone Encounter (Signed)
Pharmacy requesting refill on Potassium Chloride ER 10 mEq.  This is not on her med list.

## 2017-11-15 ENCOUNTER — Ambulatory Visit: Payer: Self-pay

## 2017-11-18 ENCOUNTER — Other Ambulatory Visit: Payer: Self-pay

## 2017-11-18 NOTE — Telephone Encounter (Signed)
Patient was started on Potassium in the hospital.  Her level then was 3.4, last month it was up to 5.2.  Patient instructed to discontinue. Patient had Lisinopril and Losartan on her med list.  When asked about it the patient states that while in the hospital she asked them to change the Lisinopril because it was making her sick.  That's when the change was made.  The Lisinopril has been removed from her list.

## 2017-11-18 NOTE — Telephone Encounter (Signed)
ok 

## 2017-11-20 ENCOUNTER — Ambulatory Visit (INDEPENDENT_AMBULATORY_CARE_PROVIDER_SITE_OTHER): Payer: Medicare Other

## 2017-11-20 ENCOUNTER — Telehealth: Payer: Self-pay | Admitting: Family Medicine

## 2017-11-20 ENCOUNTER — Ambulatory Visit: Payer: Medicare Other

## 2017-11-20 ENCOUNTER — Ambulatory Visit: Payer: Self-pay

## 2017-11-20 DIAGNOSIS — I82503 Chronic embolism and thrombosis of unspecified deep veins of lower extremity, bilateral: Secondary | ICD-10-CM | POA: Diagnosis not present

## 2017-11-20 DIAGNOSIS — Z7901 Long term (current) use of anticoagulants: Secondary | ICD-10-CM

## 2017-11-20 DIAGNOSIS — E139 Other specified diabetes mellitus without complications: Secondary | ICD-10-CM | POA: Diagnosis not present

## 2017-11-20 DIAGNOSIS — I10 Essential (primary) hypertension: Secondary | ICD-10-CM | POA: Diagnosis not present

## 2017-11-20 DIAGNOSIS — E7849 Other hyperlipidemia: Secondary | ICD-10-CM | POA: Diagnosis not present

## 2017-11-20 LAB — POCT INR
AVAILABLE: 33.7
INR: 1.9 — AB (ref 2.0–3.0)

## 2017-11-20 NOTE — Patient Instructions (Signed)
Description   Today ONLY (11/1017) take 4mg  than resume 2mg  qd, follow up in 1 week.

## 2017-11-21 LAB — CBC WITH DIFFERENTIAL/PLATELET
BASOS ABS: 0.1 10*3/uL (ref 0.0–0.2)
Basos: 1 %
EOS (ABSOLUTE): 0.2 10*3/uL (ref 0.0–0.4)
Eos: 3 %
HEMOGLOBIN: 8.1 g/dL — AB (ref 11.1–15.9)
Hematocrit: 25.2 % — ABNORMAL LOW (ref 34.0–46.6)
Immature Grans (Abs): 0 10*3/uL (ref 0.0–0.1)
Immature Granulocytes: 0 %
LYMPHS ABS: 2.3 10*3/uL (ref 0.7–3.1)
LYMPHS: 30 %
MCH: 27.3 pg (ref 26.6–33.0)
MCHC: 32.1 g/dL (ref 31.5–35.7)
MCV: 85 fL (ref 79–97)
MONOCYTES: 10 %
Monocytes Absolute: 0.8 10*3/uL (ref 0.1–0.9)
Neutrophils Absolute: 4.2 10*3/uL (ref 1.4–7.0)
Neutrophils: 56 %
PLATELETS: 308 10*3/uL (ref 150–450)
RBC: 2.97 x10E6/uL — AB (ref 3.77–5.28)
RDW: 16.2 % — ABNORMAL HIGH (ref 12.3–15.4)
WBC: 7.6 10*3/uL (ref 3.4–10.8)

## 2017-11-21 LAB — COMPREHENSIVE METABOLIC PANEL
A/G RATIO: 1.2 (ref 1.2–2.2)
ALK PHOS: 84 IU/L (ref 39–117)
ALT: 12 IU/L (ref 0–32)
AST: 9 IU/L (ref 0–40)
Albumin: 2.8 g/dL — ABNORMAL LOW (ref 3.5–4.7)
BUN / CREAT RATIO: 18 (ref 12–28)
BUN: 22 mg/dL (ref 8–27)
Bilirubin Total: <0.2 mg/dL (ref 0.0–1.2)
CO2: 19 mmol/L — ABNORMAL LOW (ref 20–29)
Calcium: 8.5 mg/dL — ABNORMAL LOW (ref 8.7–10.3)
Chloride: 108 mmol/L — ABNORMAL HIGH (ref 96–106)
Creatinine, Ser: 1.19 mg/dL — ABNORMAL HIGH (ref 0.57–1.00)
GFR calc Af Amer: 49 mL/min/{1.73_m2} — ABNORMAL LOW (ref >59)
GFR calc non Af Amer: 43 mL/min/{1.73_m2} — ABNORMAL LOW (ref >59)
GLOBULIN, TOTAL: 2.4 g/dL (ref 1.5–4.5)
Glucose: 115 mg/dL — ABNORMAL HIGH (ref 65–99)
POTASSIUM: 5 mmol/L (ref 3.5–5.2)
SODIUM: 141 mmol/L (ref 134–144)
Total Protein: 5.2 g/dL — ABNORMAL LOW (ref 6.0–8.5)

## 2017-11-21 LAB — LIPID PANEL
CHOL/HDL RATIO: 3.4 ratio (ref 0.0–4.4)
Cholesterol, Total: 131 mg/dL (ref 100–199)
HDL: 39 mg/dL — AB (ref >39)
LDL CALC: 76 mg/dL (ref 0–99)
Triglycerides: 81 mg/dL (ref 0–149)
VLDL Cholesterol Cal: 16 mg/dL (ref 5–40)

## 2017-11-21 LAB — HEMOGLOBIN A1C
ESTIMATED AVERAGE GLUCOSE: 134 mg/dL
HEMOGLOBIN A1C: 6.3 % — AB (ref 4.8–5.6)

## 2017-11-21 LAB — TSH: TSH: 4.33 u[IU]/mL (ref 0.450–4.500)

## 2017-11-25 ENCOUNTER — Other Ambulatory Visit: Payer: Self-pay

## 2017-11-25 NOTE — Patient Outreach (Signed)
[facility]) Care Management  11/25/2017  Melanie Diaz 11/02/1933 [id number]   Medication Adherence call to Melanie Diaz left a message for patient to call back patient is due on Glipizide 10 mg and Atorvastatin 40 mg.Melanie Diaz is showing past due under [place].   [place] Management Direct Dial [phone]  Fax [phone] [email or web address]

## 2017-11-27 ENCOUNTER — Telehealth: Payer: Self-pay

## 2017-11-27 NOTE — Telephone Encounter (Signed)
-----   Message from [name]., MD sent at 11/26/2017  1:25 PM EDT ----- Follow up in next month in office for significant anemia.

## 2017-11-27 NOTE — Telephone Encounter (Signed)
Left message to call back  

## 2017-11-28 ENCOUNTER — Other Ambulatory Visit: Payer: Self-pay | Admitting: Family Medicine

## 2017-11-28 NOTE — Telephone Encounter (Signed)
[facility] faxed a refill request for the following medication. Thanks CC  metoprolol tartrate (LOPRESSOR) 50 MG tablet

## 2017-11-28 NOTE — Telephone Encounter (Signed)
Scheduled AWV for 01/08/18 @ 3 PM. -MM

## 2017-11-28 NOTE — Telephone Encounter (Signed)
Patient's daughter Janace Hoard was returning call. CB#[phone].

## 2017-11-29 ENCOUNTER — Ambulatory Visit (INDEPENDENT_AMBULATORY_CARE_PROVIDER_SITE_OTHER): Payer: Medicare Other

## 2017-11-29 DIAGNOSIS — I82503 Chronic embolism and thrombosis of unspecified deep veins of lower extremity, bilateral: Secondary | ICD-10-CM | POA: Diagnosis not present

## 2017-11-29 DIAGNOSIS — Z7901 Long term (current) use of anticoagulants: Secondary | ICD-10-CM

## 2017-11-29 LAB — POCT INR
INR: 2 (ref 2.0–3.0)
PT: 23.6

## 2017-11-29 MED ORDER — METOPROLOL TARTRATE 50 MG PO TABS
50.0000 mg | ORAL_TABLET | Freq: Two times a day (BID) | ORAL | 1 refills | Status: DC
Start: 2017-11-29 — End: 2018-07-14

## 2017-11-29 NOTE — Telephone Encounter (Signed)
Advised patient of results.  

## 2017-12-18 ENCOUNTER — Other Ambulatory Visit: Payer: Self-pay

## 2017-12-18 ENCOUNTER — Ambulatory Visit (INDEPENDENT_AMBULATORY_CARE_PROVIDER_SITE_OTHER): Payer: Medicare Other

## 2017-12-18 DIAGNOSIS — I82503 Chronic embolism and thrombosis of unspecified deep veins of lower extremity, bilateral: Secondary | ICD-10-CM

## 2017-12-18 DIAGNOSIS — Z7901 Long term (current) use of anticoagulants: Secondary | ICD-10-CM | POA: Diagnosis not present

## 2017-12-18 LAB — POCT INR
INR: 2.2 (ref 2.0–3.0)
PT: 25.8

## 2017-12-18 MED ORDER — WARFARIN SODIUM 2 MG PO TABS: 2.0000 mg | ORAL_TABLET | Freq: Every day | ORAL | 11 refills | Status: DC

## 2017-12-18 NOTE — Telephone Encounter (Signed)
Patient needed refills on medication.

## 2017-12-23 DIAGNOSIS — E113413 Type 2 diabetes mellitus with severe nonproliferative diabetic retinopathy with macular edema, bilateral: Secondary | ICD-10-CM | POA: Diagnosis not present

## 2018-01-08 ENCOUNTER — Ambulatory Visit: Payer: Medicare Other

## 2018-01-15 ENCOUNTER — Other Ambulatory Visit: Payer: Self-pay | Admitting: Family Medicine

## 2018-01-15 ENCOUNTER — Ambulatory Visit (INDEPENDENT_AMBULATORY_CARE_PROVIDER_SITE_OTHER): Payer: Medicare Other

## 2018-01-15 DIAGNOSIS — I82503 Chronic embolism and thrombosis of unspecified deep veins of lower extremity, bilateral: Secondary | ICD-10-CM

## 2018-01-15 DIAGNOSIS — Z7901 Long term (current) use of anticoagulants: Secondary | ICD-10-CM

## 2018-01-15 LAB — POCT INR
INR: 2.2 (ref 2.0–3.0)
PT: 25.9

## 2018-01-15 NOTE — Patient Instructions (Signed)
Continue 2mg  daily. Recheck in four weeks.

## 2018-01-20 ENCOUNTER — Other Ambulatory Visit: Payer: Self-pay

## 2018-01-20 NOTE — Patient Outreach (Signed)
[facility]) Care Management  01/20/2018  Melanie Diaz 31-Jul-1933 [id number]   Medication Adherence call to Melanie Diaz left a message for patient to call back patient is due on Atorvastatin 40 mg. Melanie Diaz is showing past due under [place].   [place] Management Direct Dial [phone]  Fax [phone] Melanie Diaz

## 2018-01-31 ENCOUNTER — Other Ambulatory Visit: Payer: Self-pay | Admitting: Family Medicine

## 2018-01-31 DIAGNOSIS — R42 Dizziness and giddiness: Secondary | ICD-10-CM

## 2018-02-04 DIAGNOSIS — E113413 Type 2 diabetes mellitus with severe nonproliferative diabetic retinopathy with macular edema, bilateral: Secondary | ICD-10-CM | POA: Diagnosis not present

## 2018-02-10 ENCOUNTER — Other Ambulatory Visit: Payer: Self-pay | Admitting: Family Medicine

## 2018-02-10 DIAGNOSIS — R42 Dizziness and giddiness: Secondary | ICD-10-CM

## 2018-02-13 ENCOUNTER — Ambulatory Visit (INDEPENDENT_AMBULATORY_CARE_PROVIDER_SITE_OTHER): Payer: Medicare Other

## 2018-02-13 ENCOUNTER — Ambulatory Visit (INDEPENDENT_AMBULATORY_CARE_PROVIDER_SITE_OTHER): Payer: Medicare Other | Admitting: Family Medicine

## 2018-02-13 VITALS — BP 136/48 | HR 41 | Temp 98.2°F | Resp 14 | Ht 63.0 in | Wt 117.0 lb

## 2018-02-13 DIAGNOSIS — Z0000 Encounter for general adult medical examination without abnormal findings: Secondary | ICD-10-CM

## 2018-02-13 DIAGNOSIS — E7849 Other hyperlipidemia: Secondary | ICD-10-CM

## 2018-02-13 DIAGNOSIS — I639 Cerebral infarction, unspecified: Secondary | ICD-10-CM

## 2018-02-13 DIAGNOSIS — Z7901 Long term (current) use of anticoagulants: Secondary | ICD-10-CM

## 2018-02-13 DIAGNOSIS — I63539 Cerebral infarction due to unspecified occlusion or stenosis of unspecified posterior cerebral artery: Secondary | ICD-10-CM

## 2018-02-13 DIAGNOSIS — Z23 Encounter for immunization: Secondary | ICD-10-CM

## 2018-02-13 DIAGNOSIS — Z9119 Patient's noncompliance with other medical treatment and regimen: Secondary | ICD-10-CM

## 2018-02-13 DIAGNOSIS — Z91199 Patient's noncompliance with other medical treatment and regimen due to unspecified reason: Secondary | ICD-10-CM

## 2018-02-13 DIAGNOSIS — I82503 Chronic embolism and thrombosis of unspecified deep veins of lower extremity, bilateral: Secondary | ICD-10-CM

## 2018-02-13 DIAGNOSIS — S88919A Complete traumatic amputation of unspecified lower leg, level unspecified, initial encounter: Secondary | ICD-10-CM

## 2018-02-13 DIAGNOSIS — G547 Phantom limb syndrome without pain: Secondary | ICD-10-CM

## 2018-02-13 LAB — POCT INR
INR: 1.8 — AB (ref 2.0–3.0)
PT: 21.6

## 2018-02-13 NOTE — Progress Notes (Signed)
Subjective:   Melanie Diaz is a 84 y.o. female who presents for an Initial Medicare Annual Wellness Visit.  Review of Systems    N/A  Cardiac Risk Factors include: advanced age (>32men, >23 women);dyslipidemia;diabetes mellitus;sedentary lifestyle;hypertension     Objective:    Today's Vitals   02/13/18 1527  BP: (!) 136/48  Pulse: (!) 41  Resp: 14  Temp: 98.2 F (36.8 C)  TempSrc: Oral  SpO2: 97%  Weight: 117 lb (53.1 kg)  Height: 5\' 3"  (1.6 m)   Body mass index is 20.73 kg/m.  Pt presents today for Annual Wellness Visit.   Pt c/o fatigue. Denies chest pain/pressure, dizziness, numbness and tingling of her upper extremities. Also denies headache and dyspnea. Pt states she took her antihypertensive early this morning. Appears pt is cheduled for f/u with Dr. Rosanna Randy today. Dr. Rosanna Randy has been made aware of above B/P and pulse rate. Will address during his visit with pt today.   Advanced Directives 02/13/2018 08/10/2017 12/03/2016 11/19/2016  Does Patient Have a Medical Advance Directive? No No No No  Would patient like information on creating a medical advance directive? Yes (MAU/Ambulatory/Procedural Areas - Information given);No - Patient declined No - Patient declined No - Patient declined -    Current Medications (verified) Outpatient Encounter Medications as of 02/13/2018  Medication Sig  . amLODipine (NORVASC) 10 MG tablet Take 1 tablet (10 mg total) by mouth daily.  Marland Kitchen atorvastatin (LIPITOR) 40 MG tablet Take 1 tablet (40 mg total) by mouth daily at 6 PM.  . diazepam (VALIUM) 2 MG tablet Take 0.5-1 tablets (1-2 mg total) by mouth every 6 (six) hours as needed for anxiety.  Marland Kitchen glipiZIDE (GLUCOTROL) 10 MG tablet Take 1 tablet (10 mg total) by mouth daily.  Marland Kitchen losartan (COZAAR) 25 MG tablet Take 1 tablet (25 mg total) by mouth once daily  . metoprolol tartrate (LOPRESSOR) 50 MG tablet Take 1 tablet (50 mg total) by mouth 2 (two) times daily.  . polyethylene glycol  (MIRALAX / GLYCOLAX) packet Take 17 g by mouth daily.  Marland Kitchen warfarin (COUMADIN) 1 MG tablet Take 1.5 tablets (1.5 mg total) by mouth daily at 6 PM.  . warfarin (COUMADIN) 2 MG tablet Take 1 tablet (2 mg total) by mouth daily.  Marland Kitchen aspirin (ASPIRIN CHILDRENS) 81 MG chewable tablet Chew 1 tablet (81 mg total) by mouth daily. (Patient not taking: Reported on 02/13/2018)  . gabapentin (NEURONTIN) 300 MG capsule Take 1 capsule (300 mg total) by mouth 2 (two) times daily. (Patient not taking: Reported on 02/13/2018)  . meclizine (ANTIVERT) 25 MG tablet Take 1 tablet (25 mg total) by mouth 3 (three) times daily. (Patient not taking: Reported on 02/13/2018)  . scopolamine (TRANSDERM-SCOP) 1 MG/3DAYS Place 1 patch (1.5 mg total) onto the skin every 3 (three) days. (Patient not taking: Reported on 02/13/2018)   No facility-administered encounter medications on file as of 02/13/2018.     Allergies (verified) Amoxicillin and Azithromycin   History: Past Medical History:  Diagnosis Date  . Depression   . Diabetes mellitus without complication ([place])   . History of deep vein thrombosis    about 30 years ago  . Hyperlipidemia 07/06/2005  . Hypertension    Past Surgical History:  Procedure Laterality Date  . LEG AMPUTATION Right    Family History  Problem Relation Age of Onset  . Kidney failure Mother   . Heart attack Sister        Hx of MI  .  Diabetes Brother        borderline diabetes  . Cancer Maternal Aunt        Ovarian cancer   Social History   Socioeconomic History  . Marital status: Widowed    Spouse name: Not on file  . Number of children: 3  . Years of education: Not on file  . Highest education level: 12th grade  Occupational History  . Occupation: Retired  Scientific laboratory technician  . Financial resource strain: Not hard at all  . Food insecurity:    Worry: Never true    Inability: Never true  . Transportation needs:    Medical: No    Non-medical: No  Tobacco Use  . Smoking status: Never  Smoker  . Smokeless tobacco: Never Used  . Tobacco comment: smoking cessation materials not required  Substance and Sexual Activity  . Alcohol use: No  . Drug use: No  . Sexual activity: Not on file  Lifestyle  . Physical activity:    Days per week: 4 days    Minutes per session: 20 min  . Stress: To some extent  Relationships  . Social connections:    Talks on phone: Patient refused    Gets together: Patient refused    Attends religious service: Patient refused    Active member of club or organization: Patient refused    Attends meetings of clubs or organizations: Patient refused    Relationship status: Widowed  Other Topics Concern  . Not on file  Social History Narrative   Lives at home by herself. Has a wheelchair. Daughter checks on her every day    Tobacco Counseling Counseling given: No Comment: smoking cessation materials not required  Clinical Intake:  Pre-visit preparation completed: Yes  Pain : No/denies pain   BMI - recorded: 20.73 Nutritional Status: BMI <19  Underweight Nutritional Risks: None  Nutrition Risk Assessment:  Has the patient had any N/V/D within the last 2 months?  No  Does the patient have any non-healing wounds?  No  Has the patient had any unintentional weight loss or weight gain?  No   Diabetes:  Is the patient diabetic?  Yes  If diabetic, was a CBG obtained today?  No  Did the patient bring in their glucometer from home?  No  How often do you monitor your CBG's? qd.   Financial Strains and Diabetes Management:  Are you having any financial strains with the device, your supplies or your medication? No .  Does the patient want to be seen by Chronic Care Management for management of their diabetes?  No  Would the patient like to be referred to a Nutritionist or for Diabetic Management?  No   Diabetic Exams:  Diabetic Eye Exam: Completed 05/31/17.  Diabetic Foot Exam: Pt states she has completed within last year. No date on  file.  How often do you need to have someone help you when you read instructions, pamphlets, or other written materials from your doctor or pharmacy?: 1 - Never  Interpreter Needed?: No  Information entered by :: AEversole, LPN   Activities of Daily Living In your present state of health, do you have any difficulty performing the following activities: 02/13/2018 08/10/2017  Hearing? Y N  Comment denies hearing aids -  Vision? Y N  Comment wears eyeglasses -  Difficulty concentrating or making decisions? N N  Walking or climbing stairs? Tempie Donning  Comment uses w/c and walker; avoids stairs d/t inability to use -  Dressing  or bathing? N N  Doing errands, shopping? Tempie Donning  Comment family transports -  Conservation officer, nature and eating ? N -  Comment denies dentures -  Using the Toilet? N -  In the past six months, have you accidently leaked urine? N -  Do you have problems with loss of bowel control? N -  Managing your Medications? N -  Managing your Finances? N -  Housekeeping or managing your Housekeeping? N -  Some recent data might be hidden     Immunizations and Health Maintenance There is no immunization history for the selected administration types on file for this patient. Health Maintenance Due  Topic Date Due  . FOOT EXAM  11/26/1943  . DEXA SCAN  11/26/2000  . PNA vac Low Risk Adult (2 of 2 - PPSV23) 03/26/2015    Patient Care Team: [name]., MD as PCP - General (Family Medicine)  Indicate any recent Medical Services you may have received from other than [facility] providers in the past year (date may be approximate).     Assessment:   This is a routine wellness examination for Melanie Diaz.  Hearing/Vision screen Vision Screening Comments: [facility] for annual eye exams  Dietary issues and exercise activities discussed: Current Exercise Habits: Home exercise routine, Type of exercise: strength training/weights, Time (Minutes): 20, Frequency (Times/Week):  3, Weekly Exercise (Minutes/Week): 60, Intensity: Mild, Exercise limited by: None identified  Goals    . Prevent falls     Recommend to remove any items from the home that may cause slips or trips.      Depression Screen PHQ 2/9 Scores 02/13/2018 11/22/2016  PHQ - 2 Score 1 1  PHQ- 9 Score 8 4    Fall Risk Fall Risk  02/13/2018 11/22/2016  Falls in the past year? No No  Risk for fall due to : Medication side effect;Impaired balance/gait;Impaired vision -  Risk for fall due to: Comment Valium; wears eyeglasses; use of w/c and walker -    FALL RISK PREVENTION PERTAINING TO THE HOME:  Any stairs in or around the home WITH handrails? Use of ramp Home free of loose throw rugs in walkways, pet beds, electrical cords, etc? Yes  Adequate lighting in your home to reduce risk of falls? Yes   ASSISTIVE DEVICES UTILIZED TO PREVENT FALLS:  Life alert? Yes  Use of a cane, walker or w/c? Yes, walker and w/c Grab bars in the bathroom? Yes  Shower chair or bench in shower? Yes  Elevated toilet seat or a handicapped toilet? No   DME ORDERS:  DME order needed?  No   TIMED UP AND GO:  Was the test performed? Unable to perform.   Education: Fall risk prevention has been discussed.  Intervention(s) required? No   Cognitive Function: Declined        Screening Tests Health Maintenance  Topic Date Due  . FOOT EXAM  11[birth year]26/1943  . DEXA SCAN  11/26/2000  . PNA vac Low Risk Adult (2 of 2 - PPSV23) 03/26/2015  . INFLUENZA VACCINE  03/14/2018 (Originally 12/12/2017)  . HEMOGLOBIN A1C  05/23/2018  . OPHTHALMOLOGY EXAM  05/31/2018  . TETANUS/TDAP  03/25/2024    Qualifies for Shingles Vaccine? Yes . Due for Shingrix. Education has been provided regarding the importance of this vaccine. Pt has been advised to call insurance company to determine out of pocket expense. Advised may also receive vaccine at local pharmacy or Health Dept. Verbalized acceptance and understanding.  Flu  Vaccine: Due  for Flu vaccine. Does the patient want to receive this vaccine today?  No . Education has been provided regarding the importance of this vaccine but still declined. Advised may receive this vaccine at local pharmacy or Health Dept. Aware to provide a copy of the vaccination record if obtained from local pharmacy or Health Dept. Verbalized acceptance and understanding.  Pneumococcal Vaccine: Due for Pneumococcal vaccine. Does the patient want to receive this vaccine today?  Yes .   Cancer Screenings:  Colorectal Screening: No longer required.   Mammogram: No longer required.   Bone Density: Pt declined my offer to order this screening today. Education provided re: importance of this screening but still declined.  Lung Cancer Screening: (Low Dose CT Chest recommended if Age 33-80 years, 30 pack-year currently smoking OR have quit w/in 15years.) does not qualify.   Additional Screening:  Hepatitis C Screening: does not qualify  Dental Screening: Recommended annual dental exams for proper oral hygiene  Community Resource Referral:  CRR required this visit?  No    Plan:  I have personally reviewed and addressed the Medicare Annual Wellness questionnaire and have noted the following in the patient's chart:  A. Medical and social history B. Use of alcohol, tobacco or illicit drugs  C. Current medications and supplements D. Functional ability and status E.  Nutritional status F.  Physical activity G. Advance directives H. List of other physicians I.  Hospitalizations, surgeries, and ER visits in previous 12 months J.  [place] such as hearing and vision if needed, cognitive and depression L. Referrals and appointments  In addition, I have reviewed and discussed with patient certain preventive protocols, quality metrics, and best practice recommendations. A written personalized care plan for preventive services as well as general preventive health recommendations were  provided to patient.  See attached scanned questionnaire for additional information.   Signed,  Aleatha Borer, LPN Nurse Health Advisor

## 2018-02-13 NOTE — Progress Notes (Signed)
Melanie Diaz  MRN: [id number] DOB: 07/12/1933  Subjective:  HPI     The patient is an 84 year old female who presents for 4 month follow up.  She was seen today by the nurse health advisor and  was last seen prior to that on 10/14/17 for her hospital follow up.  She has been seen monthly at the coumadin clinic and her last INR was on 01/15/18.  It was 2.2 and she is due to have it checked again today.  Her last A1C was on 11/20/17 and it was 6.3.  She states when she checks her glucose at home it runs in the range of 90's-140's.  The patient could not definitively tell me what dose Coumadin she is taking.  She states that it was doubled but there is no visit that I see where she was instructed to double any medicine.  She is to check when she gets home and call me with the strength of her medicine.    While patient was seeing the nurse health advisor it was noted taht her heart reate was in the 40's.  The patient states she feels fine and has had it that low in the past.  Patient Active Problem List   Diagnosis Date Noted  . Stroke (cerebrum) ([place]) 08/10/2017  . Chronic anticoagulation 12/03/2016  . DVT (deep venous thrombosis) ([place]) 11/22/2016  . Hypertension 12/20/2014  . Allergic rhinitis 11/16/2014  . Cannot sleep 11/16/2014  . Peripheral blood vessel disorder ([place]) 11/16/2014  . Phantom limb syndrome ([place]) 11/16/2014  . Reactive depression (situ[place]) 11/16/2014  . Amputation of lower limb ([place]) 11/16/2014  . Diabetes mellitus, type 2 ([place]) 11/16/2014  . Amputation of leg, traumatic ([place]) 06/18/2012  . Osteomyelitis of ankle and foot ([place]) 02/20/2012  . Gangrene of foot ([place]) 02/20/2012  . DM (diabetes mellitus), secondary ([place]) 07/06/2005  . HLD (hyperlipidemia) 07/06/2005    Past Medical History:  Diagnosis Date  . Depression   . Diabetes mellitus without complication ([place])   . History of deep vein thrombosis    about 30 years ago  . Hyperlipidemia 07/06/2005  .  Hypertension     Social History   Socioeconomic History  . Marital status: Widowed    Spouse name: Not on file  . Number of children: Not on file  . Years of education: Not on file  . Highest education level: Not on file  Occupational History  . Occupation: Retired  Scientific laboratory technician  . Financial resource strain: Not hard at all  . Food insecurity:    Worry: Never true    Inability: Never true  . Transportation needs:    Medical: No    Non-medical: No  Tobacco Use  . Smoking status: Never Smoker  . Smokeless tobacco: Never Used  . Tobacco comment: smoking cessation materials not required  Substance and Sexual Activity  . Alcohol use: No  . Drug use: No  . Sexual activity: Not on file  Lifestyle  . Physical activity:    Days per week: 0 days    Minutes per session: 0 min  . Stress: Not at all  Relationships  . Social connections:    Talks on phone: Patient refused    Gets together: Patient refused    Attends religious service: Patient refused    Active member of club or organization: Patient refused    Attends meetings of clubs or organizations: Patient refused    Relationship status: Widowed  . Intimate partner  violence:    Fear of current or ex partner: No    Emotionally abused: No    Physically abused: No    Forced sexual activity: No  Other Topics Concern  . Not on file  Social History Narrative   Lives at home by herself. Has a wheelchair. Daughter checks on her every day    Outpatient Encounter Medications as of 02/13/2018  Medication Sig  . amLODipine (NORVASC) 10 MG tablet Take 1 tablet (10 mg total) by mouth daily.  Marland Kitchen atorvastatin (LIPITOR) 40 MG tablet Take 1 tablet (40 mg total) by mouth daily at 6 PM.  . diazepam (VALIUM) 2 MG tablet Take 0.5-1 tablets (1-2 mg total) by mouth every 6 (six) hours as needed for anxiety.  Marland Kitchen glipiZIDE (GLUCOTROL) 10 MG tablet Take 1 tablet (10 mg total) by mouth daily.  Marland Kitchen losartan (COZAAR) 25 MG tablet Take 1 tablet (25 mg  total) by mouth once daily  . metoprolol tartrate (LOPRESSOR) 50 MG tablet Take 1 tablet (50 mg total) by mouth 2 (two) times daily.  . polyethylene glycol (MIRALAX / GLYCOLAX) packet Take 17 g by mouth daily.  Marland Kitchen warfarin (COUMADIN) 1 MG tablet Take 1.5 tablets (1.5 mg total) by mouth daily at 6 PM.  . warfarin (COUMADIN) 2 MG tablet Take 1 tablet (2 mg total) by mouth daily.  Marland Kitchen aspirin (ASPIRIN CHILDRENS) 81 MG chewable tablet Chew 1 tablet (81 mg total) by mouth daily. (Patient not taking: Reported on 02/13/2018)  . gabapentin (NEURONTIN) 300 MG capsule Take 1 capsule (300 mg total) by mouth 2 (two) times daily. (Patient not taking: Reported on 02/13/2018)  . meclizine (ANTIVERT) 25 MG tablet Take 1 tablet (25 mg total) by mouth 3 (three) times daily. (Patient not taking: Reported on 02/13/2018)  . scopolamine (TRANSDERM-SCOP) 1 MG/3DAYS Place 1 patch (1.5 mg total) onto the skin every 3 (three) days. (Patient not taking: Reported on 02/13/2018)   No facility-administered encounter medications on file as of 02/13/2018.     Allergies  Allergen Reactions  . Amoxicillin Other (See Comments)    Has patient had a PCN reaction causing immediate rash, facial/tongue/throat swelling, SOB or lightheadedness with hypotension: Unknown Has patient had a PCN reaction causing severe rash involving mucus membranes or skin necrosis: Unknown Has patient had a PCN reaction that required hospitalization: Unknown Has patient had a PCN reaction occurring within the last 10 years: Unknown If all of the above answers are "NO", then may proceed with Cephalosporin use.   . Azithromycin     vomiting and stomach upset    Review of Systems  Constitutional: Negative for fever.  Respiratory: Negative for cough, shortness of breath and wheezing.   Cardiovascular: Negative for chest pain and palpitations.  Gastrointestinal: Negative.   Skin: Negative.   Endo/Heme/Allergies: Negative.   Psychiatric/Behavioral: Negative.      Objective:   Wt-  117 BP-136/48     HR- 41   Temp-  98.2   R-  14  117    Physical Exam  Constitutional: She is oriented to person, place, and time and well-developed, well-nourished, and in no distress.  HENT:  Head: Normocephalic and atraumatic.  Right Ear: External ear normal.  Left Ear: External ear normal.  Nose: Nose normal.  Eyes: Conjunctivae are normal. No scleral icterus.  Neck: No thyromegaly present.  Cardiovascular: Normal rate, regular rhythm and normal heart sounds.  Pulmonary/Chest: Effort normal and breath sounds normal.  Abdominal: Soft.  Musculoskeletal: She exhibits no  edema.  Neurological: She is alert and oriented to person, place, and time. GCS score is 15.  Skin: Skin is warm and dry.  Psychiatric: Mood, memory, affect and judgment normal.    Assessment and Plan :  1. Chronic deep vein thrombosis (DVT) of both lower extremities, unspecified vein (HCC)  - POCT INR  2. Chronic anticoagulation   3. Noncompliance Evidently has trouble getting transportation. Chronic Care Management Consult. 4. Cerebellar infarct ([place]) Still with some dizziness.  5. Cerebrovascular accident (CVA) due to occlusion of posterior cerebral artery, unspecified blood vessel laterality ([place])   6. Phantom limb syndrome (HCC)   7. Other hyperlipidemia Rx with Statin.  8. Amputation of lower limb ([place])  I have done the exam and reviewed the chart and it is accurate to the best of my knowledge. Development worker, community has been used and  any errors in dictation or transcription are unintentional. Miguel Aschoff M.D. [facility]

## 2018-02-13 NOTE — Patient Instructions (Signed)
[name] , Thank you for taking time to come for your Medicare Wellness Visit. I appreciate your ongoing commitment to your health goals. Please review the following plan we discussed and let me know if I can assist you in the future.   Screening recommendations/referrals: Colorectal Screening: No longer required Mammogram: No longer required Bone Density: Declined  Vision and Dental Exams: Recommended annual ophthalmology exams for early detection of glaucoma and other disorders of the eye Recommended annual dental exams for proper oral hygiene  Diabetic Exams: Diabetic Eye Exam: Up to date Diabetic Foot Exam: Please schedule your appointment to complete this exam  Vaccinations: Influenza vaccine: Declined Pneumococcal vaccine: Completed today Tdap vaccine: Up to date Shingles vaccine: Please call your insurance company to determine your out of pocket expense for the Shingrix vaccine. You may receive this vaccine at your local pharmacy.  Advanced directives: Advance directives discussed with you today. You have declined to receive documents for completion.  Goals: Recommend to remove any items from the home that may cause slips or trips.  Next appointment: Please schedule your Annual Wellness Visit with your Nurse Health Advisor in one year.  Preventive Care 82 Years and Older, Female Preventive care refers to lifestyle choices and visits with your health care provider that can promote health and wellness. What does preventive care include?  A yearly physical exam. This is also called an annual well check.  Dental exams once or twice a year.  Routine eye exams. Ask your health care provider how often you should have your eyes checked.  Personal lifestyle choices, including:  Daily care of your teeth and gums.  Regular physical activity.  Eating a healthy diet.  Avoiding tobacco and drug use.  Limiting alcohol use.  Practicing safe sex.  Taking low-dose aspirin  every day if recommended by your health care provider.  Taking vitamin and mineral supplements as recommended by your health care provider. What happens during an annual well check? The services and screenings done by your health care provider during your annual well check will depend on your age, overall health, lifestyle risk factors, and family history of disease. Counseling  Your health care provider may ask you questions about your:  Alcohol use.  Tobacco use.  Drug use.  Emotional well-being.  Home and relationship well-being.  Sexual activity.  Eating habits.  History of falls.  Memory and ability to understand (cognition).  Work and work Statistician.  Reproductive health. Screening  You may have the following tests or measurements:  Height, weight, and BMI.  Blood pressure.  Lipid and cholesterol levels. These may be checked every 5 years, or more frequently if you are over 15 years old.  Skin check.  Lung cancer screening. You may have this screening every year starting at age 39 if you have a 30-pack-year history of smoking and currently smoke or have quit within the past 15 years.  Fecal occult blood test (FOBT) of the stool. You may have this test every year starting at age 86.  Flexible sigmoidoscopy or colonoscopy. You may have a sigmoidoscopy every 5 years or a colonoscopy every 10 years starting at age 59.  Hepatitis C blood test.  Hepatitis B blood test.  Sexually transmitted disease (STD) testing.  Diabetes screening. This is done by checking your blood sugar (glucose) after you have not eaten for a while (fasting). You may have this done every 1-3 years.  Bone density scan. This is done to screen for osteoporosis. You may  have this done starting at age 11.  Mammogram. This may be done every 1-2 years. Talk to your health care provider about how often you should have regular mammograms. Talk with your health care provider about your test  results, treatment options, and if necessary, the need for more tests. Vaccines  Your health care provider may recommend certain vaccines, such as:  Influenza vaccine. This is recommended every year.  Tetanus, diphtheria, and acellular pertussis (Tdap, Td) vaccine. You may need a Td booster every 10 years.  Zoster vaccine. You may need this after age 43.  Pneumococcal 13-valent conjugate (PCV13) vaccine. One dose is recommended after age 30.  Pneumococcal polysaccharide (PPSV23) vaccine. One dose is recommended after age 12. Talk to your health care provider about which screenings and vaccines you need and how often you need them. This information is not intended to replace advice given to you by your health care provider. Make sure you discuss any questions you have with your health care provider. Document Released: 05/27/2015 Document Revised: 01/18/2016 Document Reviewed: 03/01/2015 Elsevier Interactive Patient Education  2017 [place] Prevention in the Home Falls can cause injuries. They can happen to people of all ages. There are many things you can do to make your home safe and to help prevent falls. What can I do on the outside of my home?  Regularly fix the edges of walkways and driveways and fix any cracks.  Remove anything that might make you trip as you walk through a door, such as a raised step or threshold.  Trim any bushes or trees on the path to your home.  Use Resh outdoor lighting.  Clear any walking paths of anything that might make someone trip, such as rocks or tools.  Regularly check to see if handrails are loose or broken. Make sure that both sides of any steps have handrails.  Any raised decks and porches should have guardrails on the edges.  Have any leaves, snow, or ice cleared regularly.  Use sand or salt on walking paths during winter.  Clean up any spills in your garage right away. This includes oil or grease spills. What can I do in  the bathroom?  Use night lights.  Install grab bars by the toilet and in the tub and shower. Do not use towel bars as grab bars.  Use non-skid mats or decals in the tub or shower.  If you need to sit down in the shower, use a plastic, non-slip stool.  Keep the floor dry. Clean up any water that spills on the floor as soon as it happens.  Remove soap buildup in the tub or shower regularly.  Attach bath mats securely with double-sided non-slip rug tape.  Do not have throw rugs and other things on the floor that can make you trip. What can I do in the bedroom?  Use night lights.  Make sure that you have a light by your bed that is easy to reach.  Do not use any sheets or blankets that are too big for your bed. They should not hang down onto the floor.  Have a firm chair that has side arms. You can use this for support while you get dressed.  Do not have throw rugs and other things on the floor that can make you trip. What can I do in the kitchen?  Clean up any spills right away.  Avoid walking on wet floors.  Keep items that you use a lot in  easy-to-reach places.  If you need to reach something above you, use a strong step stool that has a grab bar.  Keep electrical cords out of the way.  Do not use floor polish or wax that makes floors slippery. If you must use wax, use non-skid floor wax.  Do not have throw rugs and other things on the floor that can make you trip. What can I do with my stairs?  Do not leave any items on the stairs.  Make sure that there are handrails on both sides of the stairs and use them. Fix handrails that are broken or loose. Make sure that handrails are as long as the stairways.  Check any carpeting to make sure that it is firmly attached to the stairs. Fix any carpet that is loose or worn.  Avoid having throw rugs at the top or bottom of the stairs. If you do have throw rugs, attach them to the floor with carpet tape.  Make sure that you  have a light switch at the top of the stairs and the bottom of the stairs. If you do not have them, ask someone to add them for you. What else can I do to help prevent falls?  Wear shoes that:  Do not have high heels.  Have rubber bottoms.  Are comfortable and fit you well.  Are closed at the toe. Do not wear sandals.  If you use a stepladder:  Make sure that it is fully opened. Do not climb a closed stepladder.  Make sure that both sides of the stepladder are locked into place.  Ask someone to hold it for you, if possible.  Clearly mark and make sure that you can see:  Any grab bars or handrails.  First and last steps.  Where the edge of each step is.  Use tools that help you move around (mobility aids) if they are needed. These include:  Canes.  Walkers.  Scooters.  Crutches.  Turn on the lights when you go into a dark area. Replace any light bulbs as soon as they burn out.  Set up your furniture so you have a clear path. Avoid moving your furniture around.  If any of your floors are uneven, fix them.  If there are any pets around you, be aware of where they are.  Review your medicines with your doctor. Some medicines can make you feel dizzy. This can increase your chance of falling. Ask your doctor what other things that you can do to help prevent falls. This information is not intended to replace advice given to you by your health care provider. Make sure you discuss any questions you have with your health care provider. Document Released: 02/24/2009 Document Revised: 10/06/2015 Document Reviewed: 06/04/2014 Elsevier Interactive Patient Education  2017 [employer].

## 2018-02-14 ENCOUNTER — Ambulatory Visit: Payer: Self-pay | Admitting: Pharmacist

## 2018-02-14 DIAGNOSIS — Z7901 Long term (current) use of anticoagulants: Secondary | ICD-10-CM

## 2018-02-14 DIAGNOSIS — I10 Essential (primary) hypertension: Secondary | ICD-10-CM

## 2018-02-14 DIAGNOSIS — E7849 Other hyperlipidemia: Secondary | ICD-10-CM

## 2018-02-14 DIAGNOSIS — I739 Peripheral vascular disease, unspecified: Secondary | ICD-10-CM

## 2018-02-14 DIAGNOSIS — E1169 Type 2 diabetes mellitus with other specified complication: Secondary | ICD-10-CM

## 2018-02-14 NOTE — Chronic Care Management (AMB) (Signed)
  Chronic Care Management   Note  02/14/2018 Name: Melanie Diaz MRN: [id number] DOB: Dec 06, 1933   Melanie Diaz is a pleasant 84 y.o. year old female patient in the office today for a follow up visit with [name]., MD for chronic DVT on anticoagulation, T2DM, HLD, and HTN.   Dr. Rosanna Randy asked the CCM team to consult today for assistance with chronic disease management related to transportation and counseling on DOACs (Eliquis). Melanie Diaz agreed to consider enrolling in CCM services.   Plan: Provided Melanie Diaz with information about the CCM services and contact information. Spent about 20 minutes providing counseling on Eliquis and transitioning from warfarin (Coumadin) to Eliquis. Follow up via telephone in 2 weeks.   [name], PharmD Clinical Pharmacist [place] [phone]

## 2018-02-17 DIAGNOSIS — I639 Cerebral infarction, unspecified: Secondary | ICD-10-CM | POA: Insufficient documentation

## 2018-03-10 ENCOUNTER — Ambulatory Visit: Payer: Self-pay | Admitting: Pharmacist

## 2018-03-10 DIAGNOSIS — Z7901 Long term (current) use of anticoagulants: Secondary | ICD-10-CM

## 2018-03-10 DIAGNOSIS — I82503 Chronic embolism and thrombosis of unspecified deep veins of lower extremity, bilateral: Secondary | ICD-10-CM

## 2018-03-10 NOTE — Chronic Care Management (AMB) (Signed)
  Chronic Care Management   Note  03/10/2018 Name: LEYANNA BITTMAN MRN: [id number] DOB: [birth year]   NABRIA NEVIN is a 84 y.o. year old female who sees [name]., MD for primary care. Dr. Rosanna Randy asked the CCM team to consult the patient for assistance with chronic disease management related to transitioning from warfarin to Eliquis. Referral was placed 02/14/18. Telephone outreach to patient today to follow up on care management services (initial consult on 02/14/18).   Plan: Ms. Limpert still declines CCM services at this time. She is "still deciding" whether she would like to switch to Eliquis from warfarin. I reminded patient of her upcoming INR appointment on 03/19/18 and asked her to please bring her warfarin medication bottles to the appointment.    [name], PharmD Clinical Pharmacist [place] [phone]

## 2018-03-13 ENCOUNTER — Other Ambulatory Visit: Payer: Self-pay | Admitting: Family Medicine

## 2018-03-18 DIAGNOSIS — E113413 Type 2 diabetes mellitus with severe nonproliferative diabetic retinopathy with macular edema, bilateral: Secondary | ICD-10-CM | POA: Diagnosis not present

## 2018-03-19 ENCOUNTER — Ambulatory Visit (INDEPENDENT_AMBULATORY_CARE_PROVIDER_SITE_OTHER): Payer: Medicare Other

## 2018-03-19 DIAGNOSIS — Z7901 Long term (current) use of anticoagulants: Secondary | ICD-10-CM | POA: Diagnosis not present

## 2018-03-19 DIAGNOSIS — I82503 Chronic embolism and thrombosis of unspecified deep veins of lower extremity, bilateral: Secondary | ICD-10-CM | POA: Diagnosis not present

## 2018-03-19 LAB — POCT INR
AVAILABLE: 15.5
INR: 1.3 — AB (ref 2.0–3.0)

## 2018-04-02 ENCOUNTER — Ambulatory Visit (INDEPENDENT_AMBULATORY_CARE_PROVIDER_SITE_OTHER): Payer: Medicare Other | Admitting: *Deleted

## 2018-04-02 DIAGNOSIS — Z7901 Long term (current) use of anticoagulants: Secondary | ICD-10-CM | POA: Diagnosis not present

## 2018-04-02 DIAGNOSIS — I82503 Chronic embolism and thrombosis of unspecified deep veins of lower extremity, bilateral: Secondary | ICD-10-CM

## 2018-04-02 LAB — POCT INR
INR: 4.2 — AB (ref 2.0–3.0)
PT: 50.7

## 2018-04-02 NOTE — Patient Instructions (Signed)
Hold dose for two days, then alternate dose with 1 mg daily and 2 mg daily.  Recheck 2 weeks 04/16/2017

## 2018-04-14 ENCOUNTER — Other Ambulatory Visit: Payer: Self-pay | Admitting: Family Medicine

## 2018-04-14 DIAGNOSIS — I10 Essential (primary) hypertension: Secondary | ICD-10-CM

## 2018-04-16 ENCOUNTER — Ambulatory Visit (INDEPENDENT_AMBULATORY_CARE_PROVIDER_SITE_OTHER): Payer: Medicare Other | Admitting: *Deleted

## 2018-04-16 DIAGNOSIS — I82503 Chronic embolism and thrombosis of unspecified deep veins of lower extremity, bilateral: Secondary | ICD-10-CM | POA: Diagnosis not present

## 2018-04-16 DIAGNOSIS — Z7901 Long term (current) use of anticoagulants: Secondary | ICD-10-CM | POA: Diagnosis not present

## 2018-04-16 LAB — PROTIME-INR

## 2018-04-16 LAB — POCT INR
INR: 8 — AB (ref 2.0–3.0)
PT: 96

## 2018-04-16 LAB — INR (THROMBOREL-S): INR (THROMBOREL-S): 8 — AB (ref 0.8–1.2)

## 2018-04-16 LAB — PT (THROMBOREL-S): PT (Thromborel-S): 82.6 s — ABNORMAL HIGH (ref 10.7–13.1)

## 2018-04-16 MED ORDER — WARFARIN SODIUM 1 MG PO TABS: 1.0000 mg | ORAL_TABLET | Freq: Every day | ORAL | 11 refills | Status: DC

## 2018-04-16 MED ORDER — WARFARIN SODIUM 2 MG PO TABS: 2.0000 mg | ORAL_TABLET | Freq: Every day | ORAL | 11 refills | Status: DC

## 2018-04-17 NOTE — Patient Instructions (Signed)
Referral to CCM ordered to help with Eliquis.Take vitamin K 5 mg (from [place]) today.  Hold coumadin dose for three days and recheck Saturday 04/19/2018 @9 :00 am.

## 2018-04-18 ENCOUNTER — Ambulatory Visit: Payer: Self-pay | Admitting: Pharmacist

## 2018-04-18 DIAGNOSIS — Z91199 Patient's noncompliance with other medical treatment and regimen due to unspecified reason: Secondary | ICD-10-CM

## 2018-04-18 DIAGNOSIS — Z7901 Long term (current) use of anticoagulants: Secondary | ICD-10-CM

## 2018-04-18 DIAGNOSIS — I82503 Chronic embolism and thrombosis of unspecified deep veins of lower extremity, bilateral: Secondary | ICD-10-CM

## 2018-04-18 DIAGNOSIS — Z9119 Patient's noncompliance with other medical treatment and regimen: Secondary | ICD-10-CM

## 2018-04-18 NOTE — Chronic Care Management (AMB) (Signed)
  Chronic Care Management   Note  04/18/2018 Name: Melanie Diaz MRN: [id number] DOB: 03/29/1934   Melanie Diaz is a 84 y.o. year old female who sees [name]., MD for primary care. Dr. Rosanna Randy asked the CCM team to consult the patient for assistance with chronic disease management related to chronic anticoagulation and transitioning from warfarin to Eliquis. Referral was placed 04/16/18. Telephone outreach to patient today to introduce CCM services.   Melanie Diaz agreed that CCM services would be helpful to them.   Plan: Patient agreed to services and verbal consent obtained. I have scheduled an telephone appointment for Melanie Diaz next week.    Melanie Diaz was given information about Chronic Care Management services today including:  1. CCM service includes personalized support from designated clinical staff supervised by her physician, including individualized plan of care and coordination with other care providers 2. 24/7 contact phone numbers for assistance for urgent and routine care needs. 3. Service will only be billed when office clinical staff spend 20 minutes or more in a month to coordinate care. 4. Only one practitioner may furnish and bill the service in a calendar month. 5. The patient may stop CCM services at any time (effective at the end of the month) by phone call to the office staff. 6. The patient will be responsible for cost sharing (co-pay) of up to 20% of the service fee (after annual deductible is met).   [name], PharmD Clinical Pharmacist [facility] [phone]

## 2018-04-18 NOTE — Patient Instructions (Signed)
Melanie Diaz was given information about Chronic Care Management services today including:  1. CCM service includes personalized support from designated clinical staff supervised by her physician, including individualized plan of care and coordination with other care providers 2. 24/7 contact phone numbers for assistance for urgent and routine care needs. 3. Service will only be billed when office clinical staff spend 20 minutes or more in a month to coordinate care. 4. Only one practitioner may furnish and bill the service in a calendar month. 5. The patient may stop CCM services at any time (effective at the end of the month) by phone call to the office staff. 6. The patient will be responsible for cost sharing (co-pay) of up to 20% of the service fee (after annual deductible is met).  Patient agreed to services and verbal consent obtained.  Please call a member of the CCM (Chronic Care Management) Team with any questions or case management needs:   Vanetta Mulders, BSN Nurse Care Coordinator  [phone]  [name], PharmD  Clinical Pharmacist  [phone]

## 2018-04-19 ENCOUNTER — Ambulatory Visit (INDEPENDENT_AMBULATORY_CARE_PROVIDER_SITE_OTHER): Payer: Medicare Other

## 2018-04-19 DIAGNOSIS — I82503 Chronic embolism and thrombosis of unspecified deep veins of lower extremity, bilateral: Secondary | ICD-10-CM | POA: Diagnosis not present

## 2018-04-19 DIAGNOSIS — Z7901 Long term (current) use of anticoagulants: Secondary | ICD-10-CM | POA: Diagnosis not present

## 2018-04-19 LAB — POCT INR
INR: 1.9 — AB (ref 2.0–3.0)
PT: 22.9

## 2018-04-19 MED ORDER — WARFARIN SODIUM 1 MG PO TABS: 1.0000 mg | ORAL_TABLET | Freq: Every day | ORAL | 11 refills | Status: DC

## 2018-04-19 NOTE — Patient Instructions (Signed)
Description   Referral to CCM order to help with Eliquis.Take vitamin K 5 mg (from [place]) today.   Continue 1 mg daily follow up one week

## 2018-04-23 ENCOUNTER — Telehealth: Payer: Self-pay

## 2018-04-24 ENCOUNTER — Other Ambulatory Visit: Payer: Self-pay | Admitting: Family Medicine

## 2018-04-24 ENCOUNTER — Telehealth: Payer: Self-pay

## 2018-04-24 ENCOUNTER — Ambulatory Visit (INDEPENDENT_AMBULATORY_CARE_PROVIDER_SITE_OTHER): Payer: Medicare Other

## 2018-04-24 ENCOUNTER — Ambulatory Visit: Payer: Self-pay | Admitting: Pharmacist

## 2018-04-24 DIAGNOSIS — Z7901 Long term (current) use of anticoagulants: Secondary | ICD-10-CM

## 2018-04-24 DIAGNOSIS — I82503 Chronic embolism and thrombosis of unspecified deep veins of lower extremity, bilateral: Secondary | ICD-10-CM

## 2018-04-24 LAB — POCT INR
INR: 1.1 — AB (ref 2.0–3.0)
PT: 13.1

## 2018-04-24 NOTE — Patient Instructions (Signed)
Description   Take 2 mg daily and return in 1 week per provider

## 2018-04-24 NOTE — Chronic Care Management (AMB) (Signed)
  Chronic Care Management   Note  04/24/2018 Name: Melanie Diaz MRN: [id number] DOB: 07/21/1933    84 y.o. year old female referred to Chronic Care Management by Dr. Rosanna Randy for assisting with transitioning [name] from warfarin to Eliquis. Chronic conditions include HTN, history of DVT, T2DM, HLD. Last office visit with [name]., MD was 02/13/18.   Was unable to reach patient via telephone today and have left HIPAA compliant voicemail asking patient to return my call. (unsuccessful outreach #1).  Outreach to patient today to further discuss transition to Eliquis and cost. Additional follow up that unfortunately Medicare does not cover vitamins for any reason, so she does have to pay out of pocket for Vitamin K.   Plan: Will follow-up within 3-5  business days via telephone.   [name], PharmD Clinical Pharmacist [place] [phone]

## 2018-04-28 DIAGNOSIS — E113413 Type 2 diabetes mellitus with severe nonproliferative diabetic retinopathy with macular edema, bilateral: Secondary | ICD-10-CM | POA: Diagnosis not present

## 2018-04-30 ENCOUNTER — Telehealth: Payer: Self-pay

## 2018-05-01 ENCOUNTER — Ambulatory Visit (INDEPENDENT_AMBULATORY_CARE_PROVIDER_SITE_OTHER): Payer: Medicare Other | Admitting: Family Medicine

## 2018-05-01 DIAGNOSIS — Z7901 Long term (current) use of anticoagulants: Secondary | ICD-10-CM

## 2018-05-01 DIAGNOSIS — I82503 Chronic embolism and thrombosis of unspecified deep veins of lower extremity, bilateral: Secondary | ICD-10-CM

## 2018-05-01 LAB — POCT INR
INR: 1.4 — AB (ref 2.0–3.0)
Prothrombin Time: 16.6

## 2018-05-01 NOTE — Patient Instructions (Signed)
Take 2.5 mg daily and return in 2 weeks per provider

## 2018-05-15 ENCOUNTER — Ambulatory Visit (INDEPENDENT_AMBULATORY_CARE_PROVIDER_SITE_OTHER): Payer: Medicare Other

## 2018-05-15 DIAGNOSIS — Z7901 Long term (current) use of anticoagulants: Secondary | ICD-10-CM

## 2018-05-15 DIAGNOSIS — I82503 Chronic embolism and thrombosis of unspecified deep veins of lower extremity, bilateral: Secondary | ICD-10-CM | POA: Diagnosis not present

## 2018-05-15 LAB — POCT INR
INR: 4.5 — AB (ref 2.0–3.0)
Prothrombin Time: 53.8

## 2018-05-15 NOTE — Patient Instructions (Signed)
Description   Hold for two days than resume 2mg  daily.

## 2018-05-21 ENCOUNTER — Other Ambulatory Visit: Payer: Self-pay | Admitting: Family Medicine

## 2018-05-21 DIAGNOSIS — I10 Essential (primary) hypertension: Secondary | ICD-10-CM

## 2018-05-21 NOTE — Telephone Encounter (Signed)
Pharmacy requesting refills. Please review. Thanks!

## 2018-05-22 ENCOUNTER — Ambulatory Visit (INDEPENDENT_AMBULATORY_CARE_PROVIDER_SITE_OTHER): Payer: Medicare Other | Admitting: Family Medicine

## 2018-05-22 DIAGNOSIS — I82503 Chronic embolism and thrombosis of unspecified deep veins of lower extremity, bilateral: Secondary | ICD-10-CM | POA: Diagnosis not present

## 2018-05-22 DIAGNOSIS — Z7901 Long term (current) use of anticoagulants: Secondary | ICD-10-CM | POA: Diagnosis not present

## 2018-05-22 LAB — POCT INR
INR: 1.8 — AB (ref 2.0–3.0)
PT: 21.7

## 2018-05-31 NOTE — Progress Notes (Signed)
INR adjustment in noncompliant patient

## 2018-06-02 DIAGNOSIS — E113413 Type 2 diabetes mellitus with severe nonproliferative diabetic retinopathy with macular edema, bilateral: Secondary | ICD-10-CM | POA: Diagnosis not present

## 2018-06-04 ENCOUNTER — Other Ambulatory Visit: Payer: Self-pay

## 2018-06-04 NOTE — Patient Outreach (Signed)
[facility]) Care Management  06/04/2018  JOSELY MOFFAT 1934/02/05 [id number]   Medication Adherence call to Mrs. Melanie Diaz left a message for patient to call back patient is due on all three of her medication Glipizide 10 mg, Atorvastatin 40 mg and losartan 25 mg. Mrs. Stelzner is showing past due under [place].   [place] Management Direct Dial [phone]  Fax [phone] [email or web address]

## 2018-06-13 ENCOUNTER — Ambulatory Visit (INDEPENDENT_AMBULATORY_CARE_PROVIDER_SITE_OTHER): Payer: Medicare Other

## 2018-06-13 DIAGNOSIS — I82503 Chronic embolism and thrombosis of unspecified deep veins of lower extremity, bilateral: Secondary | ICD-10-CM

## 2018-06-13 DIAGNOSIS — Z7901 Long term (current) use of anticoagulants: Secondary | ICD-10-CM

## 2018-06-13 LAB — POCT INR
INR: 2.3 (ref 2.0–3.0)
PT: 28.1

## 2018-06-13 NOTE — Patient Instructions (Signed)
Description   Continue 2mg  daily. Patient was taking 2 mg since last PT/INR check NOT 1mg .

## 2018-07-07 ENCOUNTER — Ambulatory Visit (INDEPENDENT_AMBULATORY_CARE_PROVIDER_SITE_OTHER): Payer: Medicare Other | Admitting: Family Medicine

## 2018-07-07 DIAGNOSIS — I82503 Chronic embolism and thrombosis of unspecified deep veins of lower extremity, bilateral: Secondary | ICD-10-CM | POA: Diagnosis not present

## 2018-07-07 DIAGNOSIS — Z7901 Long term (current) use of anticoagulants: Secondary | ICD-10-CM

## 2018-07-07 DIAGNOSIS — E113413 Type 2 diabetes mellitus with severe nonproliferative diabetic retinopathy with macular edema, bilateral: Secondary | ICD-10-CM | POA: Diagnosis not present

## 2018-07-07 LAB — PROTIME-INR
INR: 1.8 — AB (ref 0.9–1.1)
PT: 22

## 2018-07-07 NOTE — Patient Instructions (Signed)
Take 2.5 mg daily and recheck in 2 weeks. Referral to CCM in epic.

## 2018-07-10 ENCOUNTER — Other Ambulatory Visit: Payer: Self-pay | Admitting: Family Medicine

## 2018-07-14 ENCOUNTER — Other Ambulatory Visit: Payer: Self-pay | Admitting: Family Medicine

## 2018-07-21 ENCOUNTER — Ambulatory Visit: Payer: Self-pay | Admitting: Pharmacist

## 2018-07-21 DIAGNOSIS — Z7901 Long term (current) use of anticoagulants: Secondary | ICD-10-CM

## 2018-07-21 DIAGNOSIS — I82503 Chronic embolism and thrombosis of unspecified deep veins of lower extremity, bilateral: Secondary | ICD-10-CM

## 2018-07-21 NOTE — Chronic Care Management (AMB) (Signed)
  Chronic Care Management   Note  07/21/2018 Name: Melanie Diaz MRN: [id number] DOB: [birth year]  Subjective: Received referral for [name], patient of Dr. Miguel Aschoff, from Melanie Diaz, Melanie Diaz on 2/24. This is the third referral for Melanie Diaz sent to CCM for switching patient to Eliquis (see chart for past notes). However, at this point in time Melanie Diaz is agreeable to switch from warfarin to Eliquis.    Objective: This patients CHA2DS2-VASc Score and unadjusted Ischemic Stroke Rate (% per year) is equal to 11.2 % stroke rate/year from a score of 7  Above score calculated as 1 point each if present [CHF, HTN, DM, Vascular=MI/PAD/Aortic Plaque, Age if 65-74, or Female] Above score calculated as 2 points each if present [Age > 75, or Stroke/TIA/TE]  Appropriate Eliquis dose: 2.5mg  BID (age >88, weight <60 kg)   Assessment: Educated patient on how medication assistance programs work. She needs to provide proof of income (example: Social Security benefit letter) and a print out from her pharmacy showing how much she has spent on prescriptions this calendar year. [employer] (manufacturer of Eliquis) requires a 3% household spend on prescriptions in order to qualify.   Goals Addressed            This Visit's Progress   . I'm ready to switch to Eliquis, but it is expensive (pt-stated)       Current Barriers:  . financial  Pharmacist Clinical Goal(s): Over the next 14 days, Melanie Diaz will provide the necessary supplementary documents (proof of out of pocket prescription expenditure, proof of household income) needed for medication assistance applications to CCM pharmacist.   Interventions: . CCM pharmacist will apply for medication assistance program for Eliquis made by [employer].  Marland Kitchen Extensive counseling on benefits of Eliquis over warfarin . Patient educated on purpose, proper use and potential adverse effects of Eliquis.     Patient Self Care Activities:   Marland Kitchen Gather necessary documents needed to apply for medication assistance  Initial goal documentation        Plan: Left patient portion of Eliquis application at the front check in desk ahead of patient's INR monitoring appointment tomorrow. Left provider portion of Eliquis application in appropriate mailbox.   Follow up: Telephone follow up appointment with CCM team member scheduled for: next 5-7 days  [name], PharmD Clinical Pharmacist [place] [phone]

## 2018-07-21 NOTE — Patient Instructions (Signed)
Goals Addressed            This Visit's Progress   . I'm ready to switch to Eliquis, but it is expensive (pt-stated)       Current Barriers:  . financial  Pharmacist Clinical Goal(s): Over the next 14 days, [name] will provide the necessary supplementary documents (proof of out of pocket prescription expenditure, proof of household income) needed for medication assistance applications to CCM pharmacist.   Interventions: . CCM pharmacist will apply for medication assistance program for Eliquis made by [employer].  Marland Kitchen Extensive counseling on benefits of Eliquis over warfarin . Patient educated on purpose, proper use and potential adverse effects of Eliquis.     Patient Self Care Activities:  Marland Kitchen Gather necessary documents needed to apply for medication assistance  Initial goal documentation         The patient verbalized understanding of instructions provided today and declined a print copy of patient instruction materials.

## 2018-07-22 ENCOUNTER — Ambulatory Visit (INDEPENDENT_AMBULATORY_CARE_PROVIDER_SITE_OTHER): Payer: Medicare Other | Admitting: Family Medicine

## 2018-07-22 DIAGNOSIS — I82503 Chronic embolism and thrombosis of unspecified deep veins of lower extremity, bilateral: Secondary | ICD-10-CM

## 2018-07-22 DIAGNOSIS — Z7901 Long term (current) use of anticoagulants: Secondary | ICD-10-CM

## 2018-07-22 LAB — POCT INR
INR: 2.8 (ref 2.0–3.0)
PT: 33.7

## 2018-07-23 NOTE — Patient Instructions (Signed)
Description   Take 2.5 mg daily and recheck in 4 weeks.

## 2018-07-23 NOTE — Progress Notes (Signed)
Per patient, she reports that she is taking 2mg  daily. However, it was noted for the last several months she has been on 2.5mg  daily. She is to continue current regime (2.5mg ) and bring meds for review. INR was stable today.

## 2018-07-28 ENCOUNTER — Other Ambulatory Visit: Payer: Self-pay

## 2018-07-28 ENCOUNTER — Ambulatory Visit: Payer: Self-pay | Admitting: Pharmacist

## 2018-07-28 DIAGNOSIS — Z7901 Long term (current) use of anticoagulants: Secondary | ICD-10-CM

## 2018-07-28 DIAGNOSIS — I82503 Chronic embolism and thrombosis of unspecified deep veins of lower extremity, bilateral: Secondary | ICD-10-CM

## 2018-08-08 NOTE — Patient Instructions (Signed)
Goals Addressed            This Visit's Progress   . I'm ready to switch to Eliquis, but it is expensive (pt-stated)       Current Barriers:  . financial  Pharmacist Clinical Goal(s): Over the next 14 days, [name] will provide the necessary supplementary documents (proof of out of pocket prescription expenditure, proof of household income) needed for medication assistance applications to CCM pharmacist.   07/28/18: Mrs. Dilling has provided proof of household income (Social Security benefit letter); still needs to provide out of pocket prescription expenditure.   Interventions: . CCM pharmacist will apply for medication assistance program for Eliquis made by [employer].  Marland Kitchen Extensive counseling on benefits of Eliquis over warfarin . Patient educated on purpose, proper use and potential adverse effects of Eliquis.     Patient Self Care Activities:  Marland Kitchen Gather necessary documents needed to apply for medication assistance  Please see past updates related to this goal by clicking on the "Past Updates" button in the selected goal          The patient verbalized understanding of instructions provided today and declined a print copy of patient instruction materials.

## 2018-08-08 NOTE — Chronic Care Management (AMB) (Signed)
  Chronic Care Management   Follow Up Note   08/08/2018 Name: ANALESE SOVINE MRN: [id number] DOB: 1933/06/30  Referred by: [name]., MD Reason for referral : Chronic Care Management (medication assistance)   SUANNE MINAHAN is a 85 y.o. year old female who is a primary care patient of [name]., MD. The CCM team was consulted for assistance with chronic disease management and care coordination needs.    Review of patient status, including review of consultants reports, relevant laboratory and other test results, and collaboration with appropriate care team members and the patient's provider was performed as part of comprehensive patient evaluation and provision of chronic care management services.    Goals Addressed            This Visit's Progress   . I'm ready to switch to Eliquis, but it is expensive (pt-stated)       Current Barriers:  . financial  Pharmacist Clinical Goal(s): Over the next 14 days, [name] will provide the necessary supplementary documents (proof of out of pocket prescription expenditure, proof of household income) needed for medication assistance applications to CCM pharmacist.   07/28/18: Mrs. Swendsen has provided proof of household income (Social Security benefit letter); still needs to provide out of pocket prescription expenditure.   Interventions: . CCM pharmacist will apply for medication assistance program for Eliquis made by [employer].  Marland Kitchen Extensive counseling on benefits of Eliquis over warfarin . Patient educated on purpose, proper use and potential adverse effects of Eliquis.     Patient Self Care Activities:  Marland Kitchen Gather necessary documents needed to apply for medication assistance  Please see past updates related to this goal by clicking on the "Past Updates" button in the selected goal           The CM team will reach out to the patient again over the next 10 days. - PharmD via telephone   [name], PharmD Clinical Pharmacist [place] [phone]

## 2018-08-11 ENCOUNTER — Telehealth: Payer: Self-pay

## 2018-08-13 ENCOUNTER — Telehealth: Payer: Self-pay

## 2018-08-15 ENCOUNTER — Other Ambulatory Visit: Payer: Self-pay

## 2018-08-15 ENCOUNTER — Ambulatory Visit: Payer: Self-pay | Admitting: Pharmacist

## 2018-08-15 DIAGNOSIS — I82503 Chronic embolism and thrombosis of unspecified deep veins of lower extremity, bilateral: Secondary | ICD-10-CM

## 2018-08-15 DIAGNOSIS — Z9119 Patient's noncompliance with other medical treatment and regimen: Secondary | ICD-10-CM

## 2018-08-15 DIAGNOSIS — Z91199 Patient's noncompliance with other medical treatment and regimen due to unspecified reason: Secondary | ICD-10-CM

## 2018-08-15 DIAGNOSIS — Z7901 Long term (current) use of anticoagulants: Secondary | ICD-10-CM

## 2018-08-15 NOTE — Chronic Care Management (AMB) (Signed)
  Chronic Care Management   Follow Up Note   08/15/2018 Name: Melanie Diaz MRN: [id number] DOB: 1933/07/07  Referred by: [name]., MD Reason for referral : Chronic Care Management (follow up- Eliquis)   Melanie Diaz is a 85 y.o. year old female who is a primary care patient of [name]., MD. The CCM team was consulted for assistance with chronic disease management and care coordination needs.    Review of patient status, including review of consultants reports, relevant laboratory and other test results, and collaboration with appropriate care team members and the patient's provider was performed as part of comprehensive patient evaluation and provision of chronic care management services.    Goals Addressed            This Visit's Progress   . I'm ready to switch to Eliquis, but it is expensive (pt-stated)       Current Barriers:  . financial  Pharmacist Clinical Goal(s): Over the next 14 days, Melanie Diaz will provide the necessary supplementary documents (proof of out of pocket prescription expenditure, proof of household income) needed for medication assistance applications to CCM pharmacist.   07/28/18: Melanie Diaz has provided proof of household income (Social Security benefit letter); still needs to provide out of pocket prescription expenditure.   Interventions: . CCM pharmacist will apply for medication assistance program for Eliquis made by [employer].  Marland Kitchen Extensive counseling on benefits of Eliquis over warfarin . Patient educated on purpose, proper use and potential adverse effects of Eliquis.     Patient Self Care Activities:  . Obtain print out from pharmacy listing total copay costs this calendar year   Please see past updates related to this goal by clicking on the "Past Updates" button in the selected goal           The patient will call [facility] as advised to reschedule appointment currently  scheduled for 08/25/18.    [name], PharmD Clinical Pharmacist [place] [phone]

## 2018-08-15 NOTE — Patient Instructions (Signed)
Goals Addressed            This Visit's Progress   . I'm ready to switch to Eliquis, but it is expensive (pt-stated)       Current Barriers:  . financial  Pharmacist Clinical Goal(s): Over the next 14 days, [name] will provide the necessary supplementary documents (proof of out of pocket prescription expenditure, proof of household income) needed for medication assistance applications to CCM pharmacist.   07/28/18: Mrs. Shiller has provided proof of household income (Social Security benefit letter); still needs to provide out of pocket prescription expenditure.   Interventions: . CCM pharmacist will apply for medication assistance program for Eliquis made by [employer].  Marland Kitchen Extensive counseling on benefits of Eliquis over warfarin . Patient educated on purpose, proper use and potential adverse effects of Eliquis.     Patient Self Care Activities:  . Obtain print out from pharmacy listing total copay costs this calendar year   Please see past updates related to this goal by clicking on the "Past Updates" button in the selected goal          The patient verbalized understanding of instructions provided today and declined a print copy of patient instruction materials.

## 2018-08-19 ENCOUNTER — Telehealth: Payer: Self-pay

## 2018-08-19 ENCOUNTER — Ambulatory Visit: Payer: Self-pay | Admitting: Family Medicine

## 2018-08-19 NOTE — Telephone Encounter (Signed)
Remind patient a drive-up protime check is available. It can be scheduled so she does not have to get out of her car.

## 2018-08-19 NOTE — Telephone Encounter (Signed)
Patient called to reschedule her PT/INR due to the virus and eye appointment changing. Patient appointment was rescheduled to Sep 22, 2018 @ 9:40 AM. Juluis Rainier

## 2018-08-20 NOTE — Telephone Encounter (Signed)
Patient was advised.  

## 2018-08-25 ENCOUNTER — Ambulatory Visit: Payer: Self-pay | Admitting: Family Medicine

## 2018-08-28 ENCOUNTER — Ambulatory Visit: Payer: Self-pay | Admitting: Family Medicine

## 2018-09-08 ENCOUNTER — Other Ambulatory Visit: Payer: Self-pay | Admitting: Family Medicine

## 2018-09-09 ENCOUNTER — Other Ambulatory Visit: Payer: Self-pay | Admitting: Family Medicine

## 2018-09-10 ENCOUNTER — Ambulatory Visit: Payer: Self-pay | Admitting: Pharmacist

## 2018-09-10 DIAGNOSIS — Z9119 Patient's noncompliance with other medical treatment and regimen: Secondary | ICD-10-CM

## 2018-09-10 DIAGNOSIS — Z7901 Long term (current) use of anticoagulants: Secondary | ICD-10-CM

## 2018-09-10 DIAGNOSIS — Z91199 Patient's noncompliance with other medical treatment and regimen due to unspecified reason: Secondary | ICD-10-CM

## 2018-09-10 DIAGNOSIS — I82503 Chronic embolism and thrombosis of unspecified deep veins of lower extremity, bilateral: Secondary | ICD-10-CM

## 2018-09-10 NOTE — Chronic Care Management (AMB) (Signed)
  Chronic Care Management   Follow Up Note   09/10/2018 Name: JANAYSHA DEPAULO MRN: [id number] DOB: Jul 04, 1933  Referred by: [name]., MD Reason for referral : Chronic Care Management (Pharmacy follow up)   GENIE WENKE is a 85 y.o. year old female who is a primary care patient of [name]., MD. The CCM team was consulted for assistance with chronic disease management and care coordination needs.    Review of patient status, including review of consultants reports, relevant laboratory and other test results, and collaboration with appropriate care team members and the patient's provider was performed as part of comprehensive patient evaluation and provision of chronic care management services.    Goals Addressed            This Visit's Progress   . I'm ready to switch to Eliquis, but it is expensive (pt-stated)       Current Barriers:  . financial  Pharmacist Clinical Goal(s): Over the next 14 days, [name] will provide the necessary supplementary documents (proof of out of pocket prescription expenditure, proof of household income) needed for medication assistance applications to CCM pharmacist.   07/28/18: Mrs. Liao has provided proof of household income (Social Security benefit letter); still needs to provide out of pocket prescription expenditure.  09/10/18: Mrs. Biasi will provide EOB statement at lab appointment (currently scheduled for May 11)   Interventions: . CCM pharmacist will apply for medication assistance program for Eliquis made by [employer].  Marland Kitchen Extensive counseling on benefits of Eliquis over warfarin . Patient educated on purpose, proper use and potential adverse effects of Eliquis.     Patient Self Care Activities:  . Obtain print out from pharmacy listing total copay costs this calendar year   Please see past updates related to this goal by clicking on the "Past Updates" button in the selected goal            Telephone follow up appointment with CCM team member scheduled for:  May 6 with PharmD   [name], PharmD Clinical Pharmacist [place] [phone]

## 2018-09-10 NOTE — Patient Instructions (Signed)
Goals Addressed            This Visit's Progress   . I'm ready to switch to Eliquis, but it is expensive (pt-stated)       Current Barriers:  . financial  Pharmacist Clinical Goal(s): Over the next 14 days, [name] will provide the necessary supplementary documents (proof of out of pocket prescription expenditure, proof of household income) needed for medication assistance applications to CCM pharmacist.   07/28/18: Mrs. Custer has provided proof of household income (Social Security benefit letter); still needs to provide out of pocket prescription expenditure.  09/10/18: Mrs. Flaim will provide EOB statement at lab appointment (currently scheduled for May 11)   Interventions: . CCM pharmacist will apply for medication assistance program for Eliquis made by [employer].  Marland Kitchen Extensive counseling on benefits of Eliquis over warfarin . Patient educated on purpose, proper use and potential adverse effects of Eliquis.     Patient Self Care Activities:  . Obtain print out from pharmacy listing total copay costs this calendar year   Please see past updates related to this goal by clicking on the "Past Updates" button in the selected goal          The patient verbalized understanding of instructions provided today and declined a print copy of patient instruction materials.

## 2018-09-17 ENCOUNTER — Ambulatory Visit: Payer: Self-pay | Admitting: Pharmacist

## 2018-09-17 DIAGNOSIS — Z7901 Long term (current) use of anticoagulants: Secondary | ICD-10-CM

## 2018-09-17 DIAGNOSIS — I82503 Chronic embolism and thrombosis of unspecified deep veins of lower extremity, bilateral: Secondary | ICD-10-CM

## 2018-09-17 NOTE — Chronic Care Management (AMB) (Signed)
  Chronic Care Management   Follow Up Note   09/17/2018 Name: Melanie Diaz MRN: [id number] DOB: 12-22-1933  Referred by: [name]., MD Reason for referral : Chronic Care Management (EOB follow up)   Melanie Diaz is a 85 y.o. year old female who is a primary care patient of [name]., MD. The CCM team was consulted for assistance with chronic disease management and care coordination needs.    Review of patient status, including review of consultants reports, relevant laboratory and other test results, and collaboration with appropriate care team members and the patient's provider was performed as part of comprehensive patient evaluation and provision of chronic care management services.    Goals Addressed            This Visit's Progress   . I'm ready to switch to Eliquis, but it is expensive (pt-stated)       Current Barriers:  . financial  Pharmacist Clinical Goal(s): Over the next 14 days, [name] will provide the necessary supplementary documents (proof of out of pocket prescription expenditure, proof of household income) needed for medication assistance applications to CCM pharmacist.   07/28/18: [name] has provided proof of household income (Social Security benefit letter); still needs to provide out of pocket prescription expenditure.  09/10/18: [name] will provide EOB statement at lab appointment on May 11   Interventions: . CCM pharmacist will apply for medication assistance program for Eliquis made by [employer].  Marland Kitchen Extensive counseling on benefits of Eliquis over warfarin . Patient educated on purpose, proper use and potential adverse effects of Eliquis.     Patient Self Care Activities:  . Obtain print out from pharmacy listing total copay costs this calendar year   Please see past updates related to this goal by clicking on the "Past Updates" button in the selected goal           The CM team will reach out  to the patient again over the next 7-10 days.  pending patient providing EOB statement at lab visit Monday, May 11   [name], PharmD Clinical Pharmacist [place] [phone]

## 2018-09-19 ENCOUNTER — Telehealth: Payer: Self-pay | Admitting: *Deleted

## 2018-09-19 NOTE — Telephone Encounter (Addendum)
FYI! Patient called and canceled her PT/INR appointment again. She did not reschedule.

## 2018-09-19 NOTE — Telephone Encounter (Signed)
FYI

## 2018-09-22 ENCOUNTER — Ambulatory Visit: Payer: Self-pay | Admitting: Family Medicine

## 2018-09-26 NOTE — Telephone Encounter (Signed)
Will not refill warfarin going forward.

## 2018-09-29 ENCOUNTER — Ambulatory Visit (INDEPENDENT_AMBULATORY_CARE_PROVIDER_SITE_OTHER): Payer: Medicare Other

## 2018-09-29 ENCOUNTER — Other Ambulatory Visit: Payer: Self-pay

## 2018-09-29 DIAGNOSIS — I82503 Chronic embolism and thrombosis of unspecified deep veins of lower extremity, bilateral: Secondary | ICD-10-CM | POA: Diagnosis not present

## 2018-09-29 LAB — POCT INR: INR: 2.5 (ref 2.0–3.0)

## 2018-09-29 NOTE — Patient Instructions (Signed)
Description   PT: INR:2.5 Take 2.5 mg daily and recheck in 4 weeks.

## 2018-10-07 ENCOUNTER — Other Ambulatory Visit: Payer: Self-pay | Admitting: Family Medicine

## 2018-10-27 ENCOUNTER — Ambulatory Visit: Payer: Self-pay | Admitting: Physician Assistant

## 2018-10-28 ENCOUNTER — Ambulatory Visit: Payer: Self-pay

## 2018-11-03 DIAGNOSIS — E113413 Type 2 diabetes mellitus with severe nonproliferative diabetic retinopathy with macular edema, bilateral: Secondary | ICD-10-CM | POA: Diagnosis not present

## 2018-11-04 ENCOUNTER — Other Ambulatory Visit: Payer: Self-pay | Admitting: Family Medicine

## 2018-11-04 ENCOUNTER — Other Ambulatory Visit: Payer: Self-pay

## 2018-11-04 ENCOUNTER — Ambulatory Visit (INDEPENDENT_AMBULATORY_CARE_PROVIDER_SITE_OTHER): Payer: Medicare Other | Admitting: Family Medicine

## 2018-11-04 DIAGNOSIS — Z7901 Long term (current) use of anticoagulants: Secondary | ICD-10-CM

## 2018-11-04 DIAGNOSIS — I10 Essential (primary) hypertension: Secondary | ICD-10-CM

## 2018-11-04 DIAGNOSIS — I82503 Chronic embolism and thrombosis of unspecified deep veins of lower extremity, bilateral: Secondary | ICD-10-CM

## 2018-11-04 DIAGNOSIS — I82509 Chronic embolism and thrombosis of unspecified deep veins of unspecified lower extremity: Secondary | ICD-10-CM

## 2018-11-04 LAB — POCT INR
INR: 3.3 — AB (ref 2.0–3.0)
PT: 39.8

## 2018-11-05 ENCOUNTER — Ambulatory Visit: Payer: Self-pay

## 2018-11-07 NOTE — Progress Notes (Signed)
PT INR

## 2018-12-02 ENCOUNTER — Ambulatory Visit: Payer: Self-pay | Admitting: Pharmacist

## 2018-12-02 ENCOUNTER — Other Ambulatory Visit: Payer: Self-pay | Admitting: Family Medicine

## 2018-12-02 ENCOUNTER — Telehealth: Payer: Self-pay

## 2018-12-02 DIAGNOSIS — Z91199 Patient's noncompliance with other medical treatment and regimen due to unspecified reason: Secondary | ICD-10-CM

## 2018-12-02 DIAGNOSIS — Z9119 Patient's noncompliance with other medical treatment and regimen: Secondary | ICD-10-CM

## 2018-12-02 DIAGNOSIS — I82509 Chronic embolism and thrombosis of unspecified deep veins of unspecified lower extremity: Secondary | ICD-10-CM

## 2018-12-02 DIAGNOSIS — Z7901 Long term (current) use of anticoagulants: Secondary | ICD-10-CM

## 2018-12-02 DIAGNOSIS — T879 Unspecified complications of amputation stump: Secondary | ICD-10-CM

## 2018-12-02 DIAGNOSIS — I82503 Chronic embolism and thrombosis of unspecified deep veins of lower extremity, bilateral: Secondary | ICD-10-CM

## 2018-12-02 NOTE — Telephone Encounter (Signed)
Needs appt in next month to check DM

## 2018-12-02 NOTE — Chronic Care Management (AMB) (Signed)
  Chronic Care Management   Follow Up Note   12/02/2018 Name: Melanie Diaz MRN: [id number] DOB: [birth year]  Subjective: Melanie Diaz is a 85 y.o. year old female who is a primary care patient of Melanie Diaz., MD. The CCM clinical pharmacist has been working with patient to transition her from warfarin to Eliquis for anticoagulation and facilitating Eliquis assistance application. This week, Ms. Diaz left completed application and supplemental materials at [facility] office for CCM pharmacist.     Assessment: Review of patient status, including review of consultants reports, relevant laboratory and other test results, and collaboration with appropriate care team members and the patient's provider was performed as part of comprehensive patient evaluation and provision of chronic care management services.    Goals Addressed            This Visit's Progress   . I'm ready to switch to Eliquis, but it is expensive (pt-stated)       Current Barriers:  . financial  Pharmacist Clinical Goal(s): Over the next 14 days, Melanie Diaz will provide the necessary supplementary documents (proof of out of pocket prescription expenditure, proof of household income) needed for medication assistance applications to CCM pharmacist.   Updated 12/02/18: Melanie Diaz has provided completed paperwork and application to submit to [employer]; CCM pharmacist submitted application to [employer] via fax, uploaded under media tab  Interventions: . CCM pharmacist will apply for medication assistance program for Eliquis made by [employer].  Marland Kitchen Extensive counseling on benefits of Eliquis over warfarin . Patient educated on purpose, proper use and potential adverse effects of Eliquis.     Patient Self Care Activities:  . Take all medications as prescribed . Monitor mail closely for communication from [place]  Please see past updates related to this goal by clicking on the "Past Updates" button in the selected goal           Follow up: Telephone follow up appointment with care management team member scheduled for: 2 weeks with PharmD    [name], PharmD Clinical Pharmacist [place] [phone]

## 2018-12-02 NOTE — Telephone Encounter (Signed)
Patient has upcoming appointment on 12/16/2018 @ 1:40 PM.

## 2018-12-03 ENCOUNTER — Other Ambulatory Visit: Payer: Self-pay | Admitting: Family Medicine

## 2018-12-03 DIAGNOSIS — R42 Dizziness and giddiness: Secondary | ICD-10-CM

## 2018-12-03 MED ORDER — DIAZEPAM 2 MG PO TABS: 1.0000 mg | ORAL_TABLET | Freq: Four times a day (QID) | ORAL | 0 refills | Status: DC | PRN

## 2018-12-03 NOTE — Telephone Encounter (Signed)
[facility] faxed refill request for the following medications:  diazepam (VALIUM) 2 MG tablet  Last Rx: 02/14/2018 LOV: 02/13/2018 Please advise. Thanks TNP

## 2018-12-03 NOTE — Telephone Encounter (Signed)
L.O.V. was 05/22/2018, please advise.

## 2018-12-15 ENCOUNTER — Telehealth: Payer: Self-pay

## 2018-12-16 ENCOUNTER — Ambulatory Visit (INDEPENDENT_AMBULATORY_CARE_PROVIDER_SITE_OTHER): Payer: Medicare Other | Admitting: Family Medicine

## 2018-12-16 ENCOUNTER — Other Ambulatory Visit: Payer: Self-pay

## 2018-12-16 ENCOUNTER — Ambulatory Visit: Payer: Self-pay | Admitting: Pharmacist

## 2018-12-16 DIAGNOSIS — L0390 Cellulitis, unspecified: Secondary | ICD-10-CM | POA: Diagnosis not present

## 2018-12-16 DIAGNOSIS — I82509 Chronic embolism and thrombosis of unspecified deep veins of unspecified lower extremity: Secondary | ICD-10-CM

## 2018-12-16 DIAGNOSIS — R51 Headache: Secondary | ICD-10-CM | POA: Diagnosis not present

## 2018-12-16 DIAGNOSIS — I82503 Chronic embolism and thrombosis of unspecified deep veins of lower extremity, bilateral: Secondary | ICD-10-CM

## 2018-12-16 DIAGNOSIS — R509 Fever, unspecified: Secondary | ICD-10-CM

## 2018-12-16 DIAGNOSIS — R519 Headache, unspecified: Secondary | ICD-10-CM

## 2018-12-16 MED ORDER — DOXYCYCLINE HYCLATE 100 MG PO TABS: 100.0000 mg | ORAL_TABLET | Freq: Two times a day (BID) | ORAL | 0 refills | Status: AC

## 2018-12-16 NOTE — Progress Notes (Signed)
Virtual Visit via Telephone Note  I connected with Melanie Diaz on 12/16/18 at  1:40 PM EDT by telephone and verified that I am speaking with the correct person using two identifiers.  Location: Patient: home  Provider: office   I discussed the limitations, risks, security and privacy concerns of performing an evaluation and management service by telephone and the availability of in person appointments. I also discussed with the patient that there may be a patient responsible charge related to this service. The patient expressed understanding and agreed to proceed.   History of Present Illness: Pt feeling better but has had some chills,myalgias. No cough or SOB.some headache.   Observations/Objective:   Assessment and Plan: 1. Cellulitis, unspecified cellulitis site Next week needs PT due to Doxy use. - doxycycline (VIBRA-TABS) 100 MG tablet; Take 1 tablet (100 mg total) by mouth 2 (two) times daily for 7 days.  Dispense: 14 tablet; Refill: 0  2. Fever, unspecified fever cause Check Covid. - Novel Coronavirus, NAA ([facility])  3. Nonintractable headache, unspecified chronicity pattern, unspecified headache type Check Covid - Novel Coronavirus, NAA ([facility])   Follow Up Instructions:    I discussed the assessment and treatment plan with the patient. The patient was provided an opportunity to ask questions and all were answered. The patient agreed with the plan and demonstrated an understanding of the instructions.   The patient was advised to call back or seek an in-person evaluation if the symptoms worsen or if the condition fails to improve as anticipated.  I provided 10 minutes of non-face-to-face time during this encounter.   Wilhemena Durie, MD

## 2018-12-16 NOTE — Chronic Care Management (AMB) (Signed)
  Chronic Care Management   Care Coordination Note  12/16/2018 Name: Melanie Diaz MRN: [id number] DOB: February 06, 1934  Prepared signature page for Eliquis application as it had been shredded previously by accident. Patient was presenting for INR check today with Dr. Rosanna Randy. I have all other parts of application ready to submit.   Patient no showed her appointment with Dr. Rosanna Randy. Will contact patient to reschedule   Follow up plan: Telephone follow up appointment with care management team member scheduled for: 1-3 days  Melanie Diaz, PharmD Clinical Pharmacist [place] [phone]

## 2018-12-17 ENCOUNTER — Other Ambulatory Visit: Payer: Self-pay

## 2018-12-17 DIAGNOSIS — Z20822 Contact with and (suspected) exposure to covid-19: Secondary | ICD-10-CM

## 2018-12-18 LAB — NOVEL CORONAVIRUS, NAA: SARS-CoV-2, NAA: NOT DETECTED

## 2018-12-23 ENCOUNTER — Telehealth: Payer: Self-pay | Admitting: Family Medicine

## 2018-12-23 NOTE — Telephone Encounter (Signed)
FYI -Pt still not feeling well:  Swelling in arm Sick from the medication - vomited - nauseated Fever today 101.5 - but no fever as of 3:30  Pt rescheduled her appt to 12-25-18.  Thanks, [employer]

## 2018-12-23 NOTE — Telephone Encounter (Signed)
FYI

## 2018-12-24 ENCOUNTER — Ambulatory Visit: Payer: Self-pay | Admitting: Family Medicine

## 2018-12-25 ENCOUNTER — Encounter: Payer: Self-pay | Admitting: Family Medicine

## 2018-12-25 ENCOUNTER — Ambulatory Visit (INDEPENDENT_AMBULATORY_CARE_PROVIDER_SITE_OTHER): Payer: Medicare Other | Admitting: Family Medicine

## 2018-12-25 ENCOUNTER — Other Ambulatory Visit: Payer: Self-pay

## 2018-12-25 VITALS — BP 162/69 | HR 52 | Temp 98.3°F

## 2018-12-25 DIAGNOSIS — I10 Essential (primary) hypertension: Secondary | ICD-10-CM

## 2018-12-25 DIAGNOSIS — Z7901 Long term (current) use of anticoagulants: Secondary | ICD-10-CM | POA: Diagnosis not present

## 2018-12-25 DIAGNOSIS — E1169 Type 2 diabetes mellitus with other specified complication: Secondary | ICD-10-CM

## 2018-12-25 DIAGNOSIS — Z9119 Patient's noncompliance with other medical treatment and regimen: Secondary | ICD-10-CM

## 2018-12-25 DIAGNOSIS — E7849 Other hyperlipidemia: Secondary | ICD-10-CM

## 2018-12-25 DIAGNOSIS — I82503 Chronic embolism and thrombosis of unspecified deep veins of lower extremity, bilateral: Secondary | ICD-10-CM

## 2018-12-25 DIAGNOSIS — Z91199 Patient's noncompliance with other medical treatment and regimen due to unspecified reason: Secondary | ICD-10-CM

## 2018-12-25 DIAGNOSIS — L0390 Cellulitis, unspecified: Secondary | ICD-10-CM

## 2018-12-25 LAB — POCT INR
INR: 2.9 (ref 2.0–3.0)
PT: 34.8

## 2018-12-25 NOTE — Patient Instructions (Signed)
Continue 2.5mg  a day.  Recheck in four weeks.

## 2018-12-25 NOTE — Progress Notes (Signed)
Patient: Melanie Diaz Female    DOB: Jan 13, 1934   85 y.o.   MRN: [id number] Visit Date: 12/25/2018  Today's Provider: Wilhemena Durie, MD   Chief Complaint  Patient presents with  . Cellulitis   Subjective:     HPI     Follow up for Cellulitis  The patient was last seen for this 9 days ago. Changes made at last visit include Started Doxycycline.  She reports excellent compliance with treatment. She feels that condition is Improved. Pt reports the swelling is better.  She is having side effects. Pt reports having "stomach issues" with taking Doxycycline.  This has been with swelling and erythema of upper right arm just above the elbow.  She had fever and chills with this and has felt weak. She now feels better. She tested negative for Covid. ------------------------------------------------------------------------------------ Pt has been very noncompliant with f/u in the years that she has been under my care.   Allergies  Allergen Reactions  . Amoxicillin Other (See Comments)    Has patient had a PCN reaction causing immediate rash, facial/tongue/throat swelling, SOB or lightheadedness with hypotension: Unknown Has patient had a PCN reaction causing severe rash involving mucus membranes or skin necrosis: Unknown Has patient had a PCN reaction that required hospitalization: Unknown Has patient had a PCN reaction occurring within the last 10 years: Unknown If all of the above answers are "NO", then may proceed with Cephalosporin use.   . Azithromycin     vomiting and stomach upset     Current Outpatient Medications:  .  amLODipine (NORVASC) 10 MG tablet, Take 1 tablet (10 mg total) by mouth daily., Disp: 30 tablet, Rfl: 5 .  atorvastatin (LIPITOR) 40 MG tablet, Take 1 tablet (40 mg total) by mouth daily at 6 PM., Disp: 30 tablet, Rfl: 5 .  diazepam (VALIUM) 2 MG tablet, Take 0.5-1 tablets (1-2 mg total) by mouth every 6 (six) hours as needed for anxiety.,  Disp: 30 tablet, Rfl: 0 .  glipiZIDE (GLUCOTROL) 10 MG tablet, Take 1 tablet (10 mg total) by mouth daily., Disp: 30 tablet, Rfl: 0 .  losartan (COZAAR) 25 MG tablet, Take 1 tablet (25 mg total) by mouth once daily, Disp: 30 tablet, Rfl: 0 .  metoprolol tartrate (LOPRESSOR) 50 MG tablet, Take 1 tablet (50 mg total) by mouth 2 (two) times daily., Disp: 60 tablet, Rfl: 5 .  warfarin (COUMADIN) 1 MG tablet, Take 1 tablet (1 mg total) by mouth daily at 6 PM., Disp: 30 tablet, Rfl: 11 .  warfarin (COUMADIN) 2 MG tablet, Take 1 tablet (2 mg total) by mouth daily., Disp: 30 tablet, Rfl: 11 .  aspirin (ASPIRIN CHILDRENS) 81 MG chewable tablet, Chew 1 tablet (81 mg total) by mouth daily. (Patient not taking: Reported on 02/13/2018), Disp: 120 tablet, Rfl: 0 .  gabapentin (NEURONTIN) 300 MG capsule, Take 1 capsule (300 mg total) by mouth 2 (two) times daily. (Patient not taking: Reported on 12/25/2018), Disp: 180 capsule, Rfl: 3 .  meclizine (ANTIVERT) 25 MG tablet, Take 1 tablet (25 mg total) by mouth 3 (three) times daily. (Patient not taking: Reported on 02/13/2018), Disp: 30 tablet, Rfl: 0 .  polyethylene glycol (MIRALAX / GLYCOLAX) packet, Take 17 g by mouth daily. (Patient not taking: Reported on 12/25/2018), Disp: 14 each, Rfl: 0 .  scopolamine (TRANSDERM-SCOP) 1 MG/3DAYS, Place 1 patch (1.5 mg total) onto the skin every 3 (three) days. (Patient not taking: Reported on 02/13/2018), Disp: 4  patch, Rfl: 5  Review of Systems  Constitutional: Positive for chills and fever. Negative for activity change, appetite change, diaphoresis, fatigue and unexpected weight change.  HENT: Positive for congestion, ear pain, postnasal drip, rhinorrhea, sinus pressure and sinus pain. Negative for ear discharge, sneezing, sore throat and tinnitus.   Eyes: Positive for itching. Negative for photophobia, pain, discharge, redness and visual disturbance.  Respiratory: Positive for cough. Negative for apnea, choking, chest tightness,  shortness of breath, wheezing and stridor.   Cardiovascular: Positive for leg swelling. Negative for chest pain and palpitations.  Gastrointestinal: Positive for abdominal pain and nausea. Negative for abdominal distention, anal bleeding, blood in stool, constipation, diarrhea, rectal pain and vomiting.  Allergic/Immunologic: Negative.   Neurological: Positive for weakness. Negative for dizziness, light-headedness and headaches.  Psychiatric/Behavioral: Positive for sleep disturbance.    Social History   Tobacco Use  . Smoking status: Never Smoker  . Smokeless tobacco: Never Used  . Tobacco comment: smoking cessation materials not required  Substance Use Topics  . Alcohol use: No      Objective:   BP (!) 162/69 (BP Location: Right Arm, Patient Position: Sitting, Cuff Size: Normal)   Pulse (!) 52   Temp 98.3 F (36.8 C) (Oral)   SpO2 99%  Vitals:   12/25/18 1110  BP: (!) 162/69  Pulse: (!) 52  Temp: 98.3 F (36.8 C)  TempSrc: Oral  SpO2: 99%     Physical Exam Vitals signs reviewed.  Constitutional:      Appearance: Normal appearance.  HENT:     Head: Atraumatic.     Right Ear: External ear normal.     Left Ear: External ear normal.     Nose: Nose normal.  Eyes:     General: No scleral icterus. Cardiovascular:     Rate and Rhythm: Regular rhythm.     Heart sounds: Normal heart sounds.  Pulmonary:     Breath sounds: Normal breath sounds.  Abdominal:     Palpations: Abdomen is soft.  Skin:    General: Skin is warm and dry.  Neurological:     Mental Status: She is oriented to person, place, and time.  Psychiatric:        Mood and Affect: Mood normal.        Behavior: Behavior normal.        Thought Content: Thought content normal.        Judgment: Judgment normal.      No results found for any visits on 12/25/18.     Assessment & Plan       1. Type 2 diabetes mellitus with other specified complication, without long-term current use of insulin (HCC)   - Hemoglobin A1c  2. Essential hypertension  - CBC with Differential/Platelet - Comprehensive metabolic panel - TSH  3. Other hyperlipidemia  - Comprehensive metabolic panel  4. Chronic deep vein thrombosis (DVT) of both lower extremities, unspecified vein (HCC) Chronic warfarin for recurrent DVT. - POCT INR  5. Chronic anticoagulation   6. Noncompliance Long term noncompliance with follow up. 7.Cellulitis of arm Resolved.More than 50% 25 minute visit spent in counseling or coordination of care    Wilhemena Durie, MD  [facility]

## 2018-12-26 LAB — COMPREHENSIVE METABOLIC PANEL
ALT: 21 IU/L (ref 0–32)
AST: 21 IU/L (ref 0–40)
Albumin/Globulin Ratio: 0.9 — ABNORMAL LOW (ref 1.2–2.2)
Albumin: 2.2 g/dL — ABNORMAL LOW (ref 3.6–4.6)
Alkaline Phosphatase: 101 IU/L (ref 39–117)
BUN/Creatinine Ratio: 20 (ref 12–28)
BUN: 29 mg/dL — ABNORMAL HIGH (ref 8–27)
Bilirubin Total: <0.2 mg/dL (ref 0.0–1.2)
CO2: 16 mmol/L — ABNORMAL LOW (ref 20–29)
Calcium: 8.1 mg/dL — ABNORMAL LOW (ref 8.7–10.3)
Chloride: 107 mmol/L — ABNORMAL HIGH (ref 96–106)
Creatinine, Ser: 1.43 mg/dL — ABNORMAL HIGH (ref 0.57–1.00)
GFR calc Af Amer: 39 mL/min/{1.73_m2} — ABNORMAL LOW (ref >59)
GFR calc non Af Amer: 34 mL/min/{1.73_m2} — ABNORMAL LOW (ref >59)
Globulin, Total: 2.5 g/dL (ref 1.5–4.5)
Glucose: 132 mg/dL — ABNORMAL HIGH (ref 65–99)
Potassium: 4 mmol/L (ref 3.5–5.2)
Sodium: 136 mmol/L (ref 134–144)
Total Protein: 4.7 g/dL — ABNORMAL LOW (ref 6.0–8.5)

## 2018-12-26 LAB — TSH: TSH: 4.1 u[IU]/mL (ref 0.450–4.500)

## 2018-12-26 LAB — CBC WITH DIFFERENTIAL/PLATELET
Basophils Absolute: 0.1 10*3/uL (ref 0.0–0.2)
Basos: 1 %
EOS (ABSOLUTE): 0.2 10*3/uL (ref 0.0–0.4)
Eos: 2 %
Hematocrit: 26.5 % — ABNORMAL LOW (ref 34.0–46.6)
Hemoglobin: 8.5 g/dL — ABNORMAL LOW (ref 11.1–15.9)
Immature Grans (Abs): 0 10*3/uL (ref 0.0–0.1)
Immature Granulocytes: 0 %
Lymphocytes Absolute: 2.4 10*3/uL (ref 0.7–3.1)
Lymphs: 23 %
MCH: 27.6 pg (ref 26.6–33.0)
MCHC: 32.1 g/dL (ref 31.5–35.7)
MCV: 86 fL (ref 79–97)
Monocytes Absolute: 0.8 10*3/uL (ref 0.1–0.9)
Monocytes: 8 %
Neutrophils Absolute: 6.9 10*3/uL (ref 1.4–7.0)
Neutrophils: 66 %
Platelets: 398 10*3/uL (ref 150–450)
RBC: 3.08 x10E6/uL — ABNORMAL LOW (ref 3.77–5.28)
RDW: 14 % (ref 11.7–15.4)
WBC: 10.3 10*3/uL (ref 3.4–10.8)

## 2018-12-26 LAB — HEMOGLOBIN A1C
Est. average glucose Bld gHb Est-mCnc: 128 mg/dL
Hgb A1c MFr Bld: 6.1 % — ABNORMAL HIGH (ref 4.8–5.6)

## 2018-12-29 ENCOUNTER — Other Ambulatory Visit: Payer: Self-pay | Admitting: Family Medicine

## 2018-12-30 ENCOUNTER — Telehealth: Payer: Self-pay

## 2018-12-30 DIAGNOSIS — E113413 Type 2 diabetes mellitus with severe nonproliferative diabetic retinopathy with macular edema, bilateral: Secondary | ICD-10-CM | POA: Diagnosis not present

## 2018-12-30 NOTE — Telephone Encounter (Signed)
-----   Message from [name]., MD sent at 12/29/2018 12:05 PM EDT ----- Chronic anemia,mild dehydration ,some ongoing kidney disease which is stable.

## 2018-12-30 NOTE — Telephone Encounter (Signed)
Unable to leave message. Called patient regarding lab results.

## 2018-12-31 NOTE — Telephone Encounter (Signed)
Pt returned call ° °Teri °

## 2019-01-02 ENCOUNTER — Telehealth: Payer: Self-pay | Admitting: Family Medicine

## 2019-01-02 NOTE — Telephone Encounter (Signed)
Pt called saying she had labs done last Thursday and has not heard back from them yet  Pt's CB#  [phone]  Melanie Diaz

## 2019-01-05 NOTE — Telephone Encounter (Signed)
Note   ----- Message from [name]., MD sent at 12/29/2018 12:05 PM EDT ----- Chronic anemia,mild dehydration ,some ongoing kidney disease which is stable.     Patient advised.

## 2019-01-07 ENCOUNTER — Telehealth: Payer: Self-pay

## 2019-01-07 NOTE — Telephone Encounter (Signed)
Patient notified of lab results. She states that she is going to the bathroom more frequently, and just doesn't feel good. She would like you to call her.

## 2019-01-09 NOTE — Telephone Encounter (Signed)
Make phone appt for next week if she needs one. thanks

## 2019-01-13 NOTE — Telephone Encounter (Signed)
Tried to contact patient to schedule phone visit for this week. No answer, unable to leave a message.

## 2019-01-26 ENCOUNTER — Ambulatory Visit (INDEPENDENT_AMBULATORY_CARE_PROVIDER_SITE_OTHER): Payer: Medicare Other | Admitting: Family Medicine

## 2019-01-26 ENCOUNTER — Other Ambulatory Visit: Payer: Self-pay

## 2019-01-26 ENCOUNTER — Other Ambulatory Visit: Payer: Self-pay | Admitting: Family Medicine

## 2019-01-26 ENCOUNTER — Encounter: Payer: Self-pay | Admitting: Family Medicine

## 2019-01-26 VITALS — BP 142/62 | HR 44 | Temp 98.3°F | Resp 16

## 2019-01-26 DIAGNOSIS — E7849 Other hyperlipidemia: Secondary | ICD-10-CM | POA: Diagnosis not present

## 2019-01-26 DIAGNOSIS — I10 Essential (primary) hypertension: Secondary | ICD-10-CM

## 2019-01-26 DIAGNOSIS — I82503 Chronic embolism and thrombosis of unspecified deep veins of lower extremity, bilateral: Secondary | ICD-10-CM | POA: Diagnosis not present

## 2019-01-26 DIAGNOSIS — E1169 Type 2 diabetes mellitus with other specified complication: Secondary | ICD-10-CM | POA: Diagnosis not present

## 2019-01-26 DIAGNOSIS — G547 Phantom limb syndrome without pain: Secondary | ICD-10-CM

## 2019-01-26 DIAGNOSIS — R001 Bradycardia, unspecified: Secondary | ICD-10-CM

## 2019-01-26 LAB — POCT INR
Est. average glucose Bld gHb Est-mCnc: 25.4
INR: 2.1 (ref 2.0–3.0)

## 2019-01-26 NOTE — Progress Notes (Signed)
Patient: Melanie Diaz Female    DOB: 07/21/1933   85 y.o.   MRN: [id number] Visit Date: 01/26/2019  Today's Provider: Wilhemena Durie, MD   Chief Complaint  Patient presents with   Follow-up   Subjective:   HPI Patient comes in today for a follow up. She was last seen in the office on 12/25/2018 due to cellulitis and chronic health recheck. No medications were changed since her last OV. She is also due to recheck her PT/INR.  Her complaints are chronic issues. No covid exposures.  BP Readings from Last 3 Encounters:  01/26/19 (!) 142/62  12/25/18 (!) 162/69  02/13/18 (!) 136/48   Lab Results  Component Value Date   INR 2.1 01/26/2019   INR 2.9 12/25/2018   INR 3.3 (A) 11/04/2018     Allergies  Allergen Reactions   Amoxicillin Other (See Comments)    Has patient had a PCN reaction causing immediate rash, facial/tongue/throat swelling, SOB or lightheadedness with hypotension: Unknown Has patient had a PCN reaction causing severe rash involving mucus membranes or skin necrosis: Unknown Has patient had a PCN reaction that required hospitalization: Unknown Has patient had a PCN reaction occurring within the last 10 years: Unknown If all of the above answers are "NO", then may proceed with Cephalosporin use.    Azithromycin     vomiting and stomach upset     Current Outpatient Medications:    amLODipine (NORVASC) 10 MG tablet, Take 1 tablet (10 mg total) by mouth daily., Disp: 30 tablet, Rfl: 5   atorvastatin (LIPITOR) 40 MG tablet, Take 1 tablet (40 mg total) by mouth daily at 6 PM., Disp: 30 tablet, Rfl: 5   diazepam (VALIUM) 2 MG tablet, Take 0.5-1 tablets (1-2 mg total) by mouth every 6 (six) hours as needed for anxiety., Disp: 30 tablet, Rfl: 0   glipiZIDE (GLUCOTROL) 10 MG tablet, Take 1 tablet (10 mg total) by mouth daily., Disp: 30 tablet, Rfl: 0   losartan (COZAAR) 25 MG tablet, Take 1 tablet (25 mg total) by mouth once daily, Disp: 30 tablet,  Rfl: 0   metoprolol tartrate (LOPRESSOR) 50 MG tablet, Take 1 tablet (50 mg total) by mouth 2 (two) times daily., Disp: 60 tablet, Rfl: 5   warfarin (COUMADIN) 1 MG tablet, Take 1 tablet (1 mg total) by mouth daily at 6 PM., Disp: 30 tablet, Rfl: 11   warfarin (COUMADIN) 2 MG tablet, Take 1 tablet (2 mg total) by mouth daily., Disp: 30 tablet, Rfl: 11   aspirin (ASPIRIN CHILDRENS) 81 MG chewable tablet, Chew 1 tablet (81 mg total) by mouth daily. (Patient not taking: Reported on 02/13/2018), Disp: 120 tablet, Rfl: 0   gabapentin (NEURONTIN) 300 MG capsule, Take 1 capsule (300 mg total) by mouth 2 (two) times daily. (Patient not taking: Reported on 12/25/2018), Disp: 180 capsule, Rfl: 3   meclizine (ANTIVERT) 25 MG tablet, Take 1 tablet (25 mg total) by mouth 3 (three) times daily. (Patient not taking: Reported on 02/13/2018), Disp: 30 tablet, Rfl: 0   polyethylene glycol (MIRALAX / GLYCOLAX) packet, Take 17 g by mouth daily. (Patient not taking: Reported on 12/25/2018), Disp: 14 each, Rfl: 0   scopolamine (TRANSDERM-SCOP) 1 MG/3DAYS, Place 1 patch (1.5 mg total) onto the skin every 3 (three) days. (Patient not taking: Reported on 02/13/2018), Disp: 4 patch, Rfl: 5  Review of Systems  Constitutional: Negative for activity change and fatigue.  Eyes: Negative.   Respiratory: Negative for  cough and shortness of breath.   Cardiovascular: Negative for chest pain, palpitations and leg swelling.  Gastrointestinal: Negative.   Musculoskeletal: Positive for arthralgias and myalgias.  Allergic/Immunologic: Negative.   Neurological: Negative for dizziness and headaches.  Hematological: Negative.   Psychiatric/Behavioral: Negative for agitation, self-injury, sleep disturbance and suicidal ideas. The patient is not nervous/anxious.     Social History   Tobacco Use   Smoking status: Never Smoker   Smokeless tobacco: Never Used   Tobacco comment: smoking cessation materials not required  Substance  Use Topics   Alcohol use: No      Objective:   BP (!) 142/62    Pulse (!) 44 Comment: irregular   Temp 98.3 F (36.8 C)    Resp 16    SpO2 99%  Vitals:   01/26/19 1538  BP: (!) 142/62  Pulse: (!) 44  Resp: 16  Temp: 98.3 F (36.8 C)  SpO2: 99%  There is no height or weight on file to calculate BMI.   Physical Exam Vitals signs reviewed.  Constitutional:      Appearance: Normal appearance.  HENT:     Head: Atraumatic.     Right Ear: External ear normal.     Left Ear: External ear normal.     Nose: Nose normal.  Eyes:     General: No scleral icterus. Cardiovascular:     Rate and Rhythm: Regular rhythm.     Heart sounds: Normal heart sounds.  Pulmonary:     Breath sounds: Normal breath sounds.  Abdominal:     Palpations: Abdomen is soft.  Skin:    General: Skin is warm and dry.  Neurological:     Mental Status: She is oriented to person, place, and time.  Psychiatric:        Mood and Affect: Mood normal.        Behavior: Behavior normal.        Thought Content: Thought content normal.        Judgment: Judgment normal.      Results for orders placed or performed in visit on 01/26/19  POCT INR  Result Value Ref Range   INR 2.1 2.0 - 3.0   Est. average glucose Bld gHb Est-mCnc 25.4        Assessment & Plan    1. Essential hypertension Controlled.  2. Chronic deep vein thrombosis (DVT) of both lower extremities, unspecified vein (HCC) Lifelong anticoagulation.Consider PPI for GI protection due to this issue - POCT INR--2.1 Repeat 4 weeks.  3. Type 2 diabetes mellitus with other specified complication, without long-term current use of insulin ([place])   4. Phantom limb syndrome (HCC)   5. Other hyperlipidemia  6.Bradycardia Cut metoprolol 50mg  to 1/2 BID. More than 50% 25 minute visit spent in counseling or coordination of care     Wilhemena Durie, MD  [facility]

## 2019-01-28 ENCOUNTER — Ambulatory Visit: Payer: Self-pay | Admitting: Pharmacist

## 2019-01-28 DIAGNOSIS — I82503 Chronic embolism and thrombosis of unspecified deep veins of lower extremity, bilateral: Secondary | ICD-10-CM

## 2019-01-28 DIAGNOSIS — Z9119 Patient's noncompliance with other medical treatment and regimen: Secondary | ICD-10-CM

## 2019-01-28 DIAGNOSIS — Z91199 Patient's noncompliance with other medical treatment and regimen due to unspecified reason: Secondary | ICD-10-CM

## 2019-01-28 DIAGNOSIS — Z7901 Long term (current) use of anticoagulants: Secondary | ICD-10-CM

## 2019-01-28 NOTE — Chronic Care Management (AMB) (Signed)
  Chronic Care Management   Care Coordination Note  01/28/2019 Name: Melanie Diaz MRN: [id number] DOB: 20-Jan-1934  Care coordination- submitted Eliquis application to BMS after obtaining all documentation needed   Goals Addressed            This Visit's Progress   . I'm ready to switch to Eliquis, but it is expensive (pt-stated)       Current Barriers:  . financial  Pharmacist Clinical Goal(s): Over the next 14 days, Melanie Diaz will provide the necessary supplementary documents (proof of out of pocket prescription expenditure, proof of household income) needed for medication assistance applications to CCM pharmacist.    Interventions: . CCM pharmacist will apply for medication assistance program for Eliquis made by [employer].  Marland Kitchen Extensive counseling on benefits of Eliquis over warfarin . Patient educated on purpose, proper use and potential adverse effects of Eliquis.   Updated 12/02/18: Melanie Diaz has provided completed paperwork and application to submit to BMS; CCM pharmacist submitted application to BMS via fax, uploaded under media tab  Updated 9/15: patient signature obtained at INR/PT appointment, submitted application to BMS  Patient Self Care Activities:  . Take all medications as prescribed . Monitor mail closely for communication from [place]  Please see past updates related to this goal by clicking on the "Past Updates" button in the selected goal           Follow up plan: Telephone follow up appointment with care management team member scheduled for: 2 weeks with PharmD   [name], PharmD Clinical Pharmacist [place] [phone]

## 2019-02-09 ENCOUNTER — Ambulatory Visit: Payer: Self-pay | Admitting: Pharmacist

## 2019-02-09 DIAGNOSIS — I82503 Chronic embolism and thrombosis of unspecified deep veins of lower extremity, bilateral: Secondary | ICD-10-CM

## 2019-02-09 DIAGNOSIS — Z9119 Patient's noncompliance with other medical treatment and regimen: Secondary | ICD-10-CM

## 2019-02-09 DIAGNOSIS — Z7901 Long term (current) use of anticoagulants: Secondary | ICD-10-CM

## 2019-02-09 DIAGNOSIS — Z91199 Patient's noncompliance with other medical treatment and regimen due to unspecified reason: Secondary | ICD-10-CM

## 2019-02-09 NOTE — Patient Instructions (Signed)
1. Please call [name], [phone], and provide your Medicare ID number   Goals Addressed            This Visit's Progress   . I'm ready to switch to Eliquis, but it is expensive (pt-stated)       Current Barriers:  . financial  Pharmacist Clinical Goal(s): Over the next 14 days, [name] will provide the necessary supplementary documents (proof of out of pocket prescription expenditure, proof of household income) needed for medication assistance applications to CCM pharmacist.    Interventions: . CCM pharmacist will apply for medication assistance program for Eliquis made by [employer].  Marland Kitchen Extensive counseling on benefits of Eliquis over warfarin . Patient educated on purpose, proper use and potential adverse effects of Eliquis.   Updated 12/02/18: [name] has provided completed paperwork and application to submit to BMS; CCM pharmacist submitted application to BMS via fax, uploaded under media tab  Updated 9/15: patient signature obtained at INR/PT appointment, submitted application to BMS  Updated 9/28: BMS needs copy of Medicare card but there is not one on file in EMR; contacted patient to provide ID #, she will call back; will update with BMS once patient provides ID  Patient Self Care Activities:  . Take all medications as prescribed . Monitor mail closely for communication from BMS  Please see past updates related to this goal by clicking on the "Past Updates" button in the selected goal

## 2019-02-09 NOTE — Chronic Care Management (AMB) (Signed)
Chronic Care Management   Follow Up Note   02/09/2019 Name: Melanie Diaz MRN: [id number] DOB: 05/30/1933  Subjective Melanie Diaz is a 85 y.o. year old female who is a primary care patient of Melanie Banana., MD. The CCM pharmacist was consulted for assistance with chronic disease management and care coordination needs- transitioning patient from warfarin to Eliquis.    Review of patient status, including review of consultants reports, relevant laboratory and other test results, and collaboration with appropriate care team members and the patient's provider was performed as part of comprehensive patient evaluation and provision of chronic care management services.    Outpatient Encounter Medications as of 02/09/2019  Medication Sig  . amLODipine (NORVASC) 10 MG tablet Take 1 tablet (10 mg total) by mouth daily.  Marland Kitchen aspirin (ASPIRIN CHILDRENS) 81 MG chewable tablet Chew 1 tablet (81 mg total) by mouth daily. (Patient not taking: Reported on 02/13/2018)  . atorvastatin (LIPITOR) 40 MG tablet Take 1 tablet (40 mg total) by mouth daily at 6 PM.  . diazepam (VALIUM) 2 MG tablet Take 0.5-1 tablets (1-2 mg total) by mouth every 6 (six) hours as needed for anxiety.  . gabapentin (NEURONTIN) 300 MG capsule Take 1 capsule (300 mg total) by mouth 2 (two) times daily. (Patient not taking: Reported on 12/25/2018)  . glipiZIDE (GLUCOTROL) 10 MG tablet Take 1 tablet (10 mg total) by mouth daily.  Marland Kitchen losartan (COZAAR) 25 MG tablet Take 1 tablet (25 mg total) by mouth once daily  . meclizine (ANTIVERT) 25 MG tablet Take 1 tablet (25 mg total) by mouth 3 (three) times daily. (Patient not taking: Reported on 02/13/2018)  . metoprolol tartrate (LOPRESSOR) 50 MG tablet Take 1 tablet (50 mg total) by mouth 2 (two) times daily.  . polyethylene glycol (MIRALAX / GLYCOLAX) packet Take 17 g by mouth daily. (Patient not taking: Reported on 12/25/2018)  . scopolamine (TRANSDERM-SCOP) 1 MG/3DAYS Place 1 patch  (1.5 mg total) onto the skin every 3 (three) days. (Patient not taking: Reported on 02/13/2018)  . warfarin (COUMADIN) 1 MG tablet Take 1 tablet (1 mg total) by mouth daily at 6 PM.  . warfarin (COUMADIN) 2 MG tablet Take 1 tablet (2 mg total) by mouth daily.   No facility-administered encounter medications on file as of 02/09/2019.      Goals Addressed            This Visit's Progress   . I'm ready to switch to Eliquis, but it is expensive (pt-stated)       Current Barriers:  . financial  Pharmacist Clinical Goal(s): Over the next 14 days, [name] will provide the necessary supplementary documents (proof of out of pocket prescription expenditure, proof of household income) needed for medication assistance applications to CCM pharmacist.    Interventions: . CCM pharmacist will apply for medication assistance program for Eliquis made by [employer].  Marland Kitchen Extensive counseling on benefits of Eliquis over warfarin . Patient educated on purpose, proper use and potential adverse effects of Eliquis.   Updated 12/02/18: [name] has provided completed paperwork and application to submit to BMS; CCM pharmacist submitted application to BMS via fax, uploaded under media tab  Updated 9/15: patient signature obtained at INR/PT appointment, submitted application to BMS  Updated 9/28: BMS needs copy of Medicare card but there is not one on file in EMR; contacted patient to provide ID #, she will call back; will update with BMS once patient provides ID  Patient Self Care Activities:  . Take all medications as prescribed . Monitor mail closely for communication from BMS  Please see past updates related to this goal by clicking on the "Past Updates" button in the selected goal          Plan and Follow up The care management team will reach out to the patient again over the next 5 days.  Will contact BMS to update Eliquis application with Medicare ID number once provided by patient    Melanie Diaz, PharmD Clinical Pharmacist [place] [phone]

## 2019-02-16 ENCOUNTER — Other Ambulatory Visit: Payer: Self-pay

## 2019-02-16 ENCOUNTER — Ambulatory Visit (INDEPENDENT_AMBULATORY_CARE_PROVIDER_SITE_OTHER): Payer: Medicare Other | Admitting: Family Medicine

## 2019-02-16 ENCOUNTER — Ambulatory Visit: Payer: Self-pay | Admitting: Pharmacist

## 2019-02-16 VITALS — BP 142/70 | HR 50 | Temp 97.9°F | Resp 20

## 2019-02-16 DIAGNOSIS — I639 Cerebral infarction, unspecified: Secondary | ICD-10-CM | POA: Diagnosis not present

## 2019-02-16 DIAGNOSIS — I10 Essential (primary) hypertension: Secondary | ICD-10-CM | POA: Diagnosis not present

## 2019-02-16 DIAGNOSIS — I82503 Chronic embolism and thrombosis of unspecified deep veins of lower extremity, bilateral: Secondary | ICD-10-CM

## 2019-02-16 DIAGNOSIS — Z961 Presence of intraocular lens: Secondary | ICD-10-CM | POA: Diagnosis not present

## 2019-02-16 DIAGNOSIS — E1152 Type 2 diabetes mellitus with diabetic peripheral angiopathy with gangrene: Secondary | ICD-10-CM

## 2019-02-16 DIAGNOSIS — S88919A Complete traumatic amputation of unspecified lower leg, level unspecified, initial encounter: Secondary | ICD-10-CM

## 2019-02-16 DIAGNOSIS — E7849 Other hyperlipidemia: Secondary | ICD-10-CM

## 2019-02-16 DIAGNOSIS — Z7901 Long term (current) use of anticoagulants: Secondary | ICD-10-CM

## 2019-02-16 DIAGNOSIS — R001 Bradycardia, unspecified: Secondary | ICD-10-CM

## 2019-02-16 DIAGNOSIS — E113413 Type 2 diabetes mellitus with severe nonproliferative diabetic retinopathy with macular edema, bilateral: Secondary | ICD-10-CM | POA: Diagnosis not present

## 2019-02-16 DIAGNOSIS — G547 Phantom limb syndrome without pain: Secondary | ICD-10-CM | POA: Diagnosis not present

## 2019-02-16 LAB — HM DIABETES EYE EXAM

## 2019-02-16 LAB — POCT INR
INR: 1.4 — AB (ref 2.0–3.0)
PT: 16.7

## 2019-02-16 NOTE — Progress Notes (Signed)
Patient: Melanie Diaz Female    DOB: 06/26/1933   85 y.o.   MRN: [id number] Visit Date: 02/16/2019  Today's Provider: Wilhemena Durie, MD   Chief Complaint  Patient presents with  . Hypertension  . DVT  . Bradycardia   Subjective:   HPI  Patient comes in today for a 3 week follow up. She is also due for a recheck of PT/INR level. On last visit, she was advised to decrease Metoprolol to 25mg  daily. She reports that she has tolerated med changes well. Her BP readings at home have averaged in the 150s/70s. Her pulse has averaged in the low to mid 50s. She denies any chest pain, headaches, shortness of breath, or dizziness.  Her present wheelchair is starting to fall apart.  She wishes another wheelchair light and when she has it is narrow enough to fit in the doorways at her home.  Will consult CCM social work to try to help her with this test.  Is already in touch with CCM pharmacy. Patient has a long history of noncompliance.  I am not sure if this is all financial just by choice.  He would be good to get her on Eliquis and off of Coumadin when it is available.  This is due to the noncompliance. BP Readings from Last 3 Encounters:  02/16/19 (!) 142/70  01/26/19 (!) 142/62  12/25/18 (!) 162/69   Pulse Readings from Last 3 Encounters:  02/16/19 (!) 50  01/26/19 (!) 44  12/25/18 (!) 52   Lab Results  Component Value Date   INR 1.4 (A) 02/16/2019   INR 2.1 01/26/2019   INR 2.9 12/25/2018    Allergies  Allergen Reactions  . Amoxicillin Other (See Comments)    Has patient had a PCN reaction causing immediate rash, facial/tongue/throat swelling, SOB or lightheadedness with hypotension: Unknown Has patient had a PCN reaction causing severe rash involving mucus membranes or skin necrosis: Unknown Has patient had a PCN reaction that required hospitalization: Unknown Has patient had a PCN reaction occurring within the last 10 years: Unknown If all of the above answers are  "NO", then may proceed with Cephalosporin use.   . Azithromycin     vomiting and stomach upset     Current Outpatient Medications:  .  amLODipine (NORVASC) 10 MG tablet, Take 1 tablet (10 mg total) by mouth daily., Disp: 30 tablet, Rfl: 5 .  atorvastatin (LIPITOR) 40 MG tablet, Take 1 tablet (40 mg total) by mouth daily at 6 PM., Disp: 30 tablet, Rfl: 5 .  diazepam (VALIUM) 2 MG tablet, Take 0.5-1 tablets (1-2 mg total) by mouth every 6 (six) hours as needed for anxiety., Disp: 30 tablet, Rfl: 0 .  glipiZIDE (GLUCOTROL) 10 MG tablet, Take 1 tablet (10 mg total) by mouth daily., Disp: 30 tablet, Rfl: 5 .  losartan (COZAAR) 25 MG tablet, Take 1 tablet (25 mg total) by mouth once daily, Disp: 30 tablet, Rfl: 0 .  metoprolol tartrate (LOPRESSOR) 50 MG tablet, Take 1 tablet (50 mg total) by mouth 2 (two) times daily., Disp: 60 tablet, Rfl: 5 .  warfarin (COUMADIN) 1 MG tablet, Take 1 tablet (1 mg total) by mouth daily at 6 PM., Disp: 30 tablet, Rfl: 11 .  warfarin (COUMADIN) 2 MG tablet, Take 1 tablet (2 mg total) by mouth daily., Disp: 30 tablet, Rfl: 11 .  aspirin (ASPIRIN CHILDRENS) 81 MG chewable tablet, Chew 1 tablet (81 mg total) by mouth daily. (  Patient not taking: Reported on 02/13/2018), Disp: 120 tablet, Rfl: 0 .  gabapentin (NEURONTIN) 300 MG capsule, Take 1 capsule (300 mg total) by mouth 2 (two) times daily. (Patient not taking: Reported on 12/25/2018), Disp: 180 capsule, Rfl: 3 .  meclizine (ANTIVERT) 25 MG tablet, Take 1 tablet (25 mg total) by mouth 3 (three) times daily. (Patient not taking: Reported on 02/13/2018), Disp: 30 tablet, Rfl: 0 .  polyethylene glycol (MIRALAX / GLYCOLAX) packet, Take 17 g by mouth daily. (Patient not taking: Reported on 12/25/2018), Disp: 14 each, Rfl: 0 .  scopolamine (TRANSDERM-SCOP) 1 MG/3DAYS, Place 1 patch (1.5 mg total) onto the skin every 3 (three) days. (Patient not taking: Reported on 02/13/2018), Disp: 4 patch, Rfl: 5  Review of Systems   Constitutional: Negative for activity change and fatigue.  Eyes: Negative.   Respiratory: Negative for cough and shortness of breath.   Cardiovascular: Negative for chest pain, palpitations and leg swelling.  Gastrointestinal: Negative.   Endocrine: Negative for cold intolerance, heat intolerance, polydipsia, polyphagia and polyuria.  Skin: Negative for color change, pallor, rash and wound.  Allergic/Immunologic: Negative.   Neurological: Negative for dizziness, light-headedness and headaches.  Hematological: Negative.   Psychiatric/Behavioral: Negative for agitation, self-injury, sleep disturbance and suicidal ideas. The patient is not nervous/anxious.     Social History   Tobacco Use  . Smoking status: Never Smoker  . Smokeless tobacco: Never Used  . Tobacco comment: smoking cessation materials not required  Substance Use Topics  . Alcohol use: No      Objective:   BP (!) 142/70   Pulse (!) 50   Temp 97.9 F (36.6 C)   Resp 20   SpO2 99%  Vitals:   02/16/19 1517  BP: (!) 142/70  Pulse: (!) 50  Resp: 20  Temp: 97.9 F (36.6 C)  SpO2: 99%  There is no height or weight on file to calculate BMI.   Physical Exam Vitals signs reviewed.  Constitutional:      Appearance: Normal appearance.  HENT:     Head: Atraumatic.     Right Ear: External ear normal.     Left Ear: External ear normal.     Nose: Nose normal.  Eyes:     General: No scleral icterus. Cardiovascular:     Rate and Rhythm: Regular rhythm.     Heart sounds: Normal heart sounds.  Pulmonary:     Breath sounds: Normal breath sounds.  Abdominal:     Palpations: Abdomen is soft.  Skin:    General: Skin is warm and dry.  Neurological:     Mental Status: She is oriented to person, place, and time.  Psychiatric:        Mood and Affect: Mood normal.        Behavior: Behavior normal.        Thought Content: Thought content normal.        Judgment: Judgment normal.      Results for orders placed or  performed in visit on 02/16/19  POCT INR  Result Value Ref Range   INR 1.4 (A) 2.0 - 3.0   PT 16.7        Assessment & Plan    1. Essential hypertension Controlled.  2. Bradycardia Improved.  I think she is fine on the low-dose metoprolol.  Follow this and may have to stop in the future.  3. Chronic deep vein thrombosis (DVT) of both lower extremities, unspecified vein (HCC) Recurring issue.  Pharmacy is  working on trying to get her on Eliquis for convenience. - POCT INR  4. Phantom limb syndrome ([place])   5. Cerebellar infarct ([place])   6. Amputation of lower limb ([place])   7. Other hyperlipidemia On Lipitor.  8. Type 2 diabetes mellitus with diabetic peripheral angiopathy and gangrene, without long-term current use of insulin (HCC) Controlled with last A1c of 6.1.     Richard Cranford Mon, MD  [facility]

## 2019-02-16 NOTE — Chronic Care Management (AMB) (Signed)
Chronic Care Management   Follow Up Note   02/16/2019 Name: Melanie Diaz MRN: [id number] DOB: [birth year]  Subjective Melanie Diaz is a 85 y.o. year old female who is a primary care patient of [name]., MD. The CCM pharmacist was consulted for assistance with chronic disease management and care coordination needs- transitioning patient from warfarin to Eliquis.    Review of patient status, including review of consultants reports, relevant laboratory and other test results, and collaboration with appropriate care team members and the patient's provider was performed as part of comprehensive patient evaluation and provision of chronic care management services.    Outpatient Encounter Medications as of 02/16/2019  Medication Sig  . amLODipine (NORVASC) 10 MG tablet Take 1 tablet (10 mg total) by mouth daily.  Marland Kitchen aspirin (ASPIRIN CHILDRENS) 81 MG chewable tablet Chew 1 tablet (81 mg total) by mouth daily. (Patient not taking: Reported on 02/13/2018)  . atorvastatin (LIPITOR) 40 MG tablet Take 1 tablet (40 mg total) by mouth daily at 6 PM.  . diazepam (VALIUM) 2 MG tablet Take 0.5-1 tablets (1-2 mg total) by mouth every 6 (six) hours as needed for anxiety.  . gabapentin (NEURONTIN) 300 MG capsule Take 1 capsule (300 mg total) by mouth 2 (two) times daily. (Patient not taking: Reported on 12/25/2018)  . glipiZIDE (GLUCOTROL) 10 MG tablet Take 1 tablet (10 mg total) by mouth daily.  Marland Kitchen losartan (COZAAR) 25 MG tablet Take 1 tablet (25 mg total) by mouth once daily  . meclizine (ANTIVERT) 25 MG tablet Take 1 tablet (25 mg total) by mouth 3 (three) times daily. (Patient not taking: Reported on 02/13/2018)  . metoprolol tartrate (LOPRESSOR) 50 MG tablet Take 1 tablet (50 mg total) by mouth 2 (two) times daily.  . polyethylene glycol (MIRALAX / GLYCOLAX) packet Take 17 g by mouth daily. (Patient not taking: Reported on 12/25/2018)  . scopolamine (TRANSDERM-SCOP) 1 MG/3DAYS Place 1 patch  (1.5 mg total) onto the skin every 3 (three) days. (Patient not taking: Reported on 02/13/2018)  . warfarin (COUMADIN) 1 MG tablet Take 1 tablet (1 mg total) by mouth daily at 6 PM.  . warfarin (COUMADIN) 2 MG tablet Take 1 tablet (2 mg total) by mouth daily.   No facility-administered encounter medications on file as of 02/16/2019.      Goals Addressed            This Visit's Progress   . I'm ready to switch to Eliquis, but it is expensive (pt-stated)       Current Barriers:  . financial  Pharmacist Clinical Goal(s): Over the next 14 days, Melanie Diaz will provide the necessary supplementary documents (proof of out of pocket prescription expenditure, proof of household income) needed for medication assistance applications to CCM pharmacist.    Interventions: . CCM pharmacist will apply for medication assistance program for Eliquis made by [employer].  Marland Kitchen Extensive counseling on benefits of Eliquis over warfarin . Patient educated on purpose, proper use and potential adverse effects of Eliquis.   Updated 12/02/18: Melanie Diaz has provided completed paperwork and application to submit to BMS; CCM pharmacist submitted application to BMS via fax, uploaded under media tab  Updated 9/15: patient signature obtained at INR/PT appointment, submitted application to BMS  Updated 9/28: BMS needs copy of Medicare card but there is not one on file in EMR; contacted patient to provide ID #, she will call back; will update with BMS once patient provides ID  Updated 10/5: patient bringing Medicare card to PT/INR visit this afternoon; updated BMS with Medicare number  Patient Self Care Activities:  . Take all medications as prescribed . Monitor mail closely for communication from [place]  Please see past updates related to this goal by clicking on the "Past Updates" button in the selected goal          Plan and Follow up Telephone follow up appointment with care management team member  scheduled for: 7-10 days    [name], PharmD Clinical Pharmacist [place] [phone]

## 2019-02-16 NOTE — Progress Notes (Addendum)
Subjective:   Melanie Diaz is a 85 y.o. female who presents for Medicare Annual (Subsequent) preventive examination.    This visit is being conducted through telemedicine due to the COVID-19 pandemic. This patient has given me verbal consent via doximity to conduct this visit, patient states they are participating from their home address. Some vital signs may be absent or patient reported.    Patient identification: identified by name, DOB, and current address  Review of Systems:  N/A  Cardiac Risk Factors include: advanced age (>40men, >74 women);dyslipidemia;hypertension;diabetes mellitus     Objective:     Vitals: There were no vitals taken for this visit.  There is no height or weight on file to calculate BMI. Unable to obtain vitals due to visit being conducted via telephonically.   Advanced Directives 02/17/2019 02/13/2018 08/10/2017 12/03/2016 11/19/2016  Does Patient Have a Medical Advance Directive? No No No No No  Would patient like information on creating a medical advance directive? No - Patient declined Yes (MAU/Ambulatory/Procedural Areas - Information given);No - Patient declined No - Patient declined No - Patient declined -    Tobacco Social History   Tobacco Use  Smoking Status Never Smoker  Smokeless Tobacco Never Used  Tobacco Comment   smoking cessation materials not required     Counseling given: Not Answered Comment: smoking cessation materials not required   Clinical Intake:  Pre-visit preparation completed: Yes  Pain : No/denies pain Pain Score: 0-No pain     Nutritional Risks: None Diabetes: Yes  How often do you need to have someone help you when you read instructions, pamphlets, or other written materials from your doctor or pharmacy?: 1 - Never   Diabetes:  Is the patient diabetic?  Yes type 2 If diabetic, was a CBG obtained today?  No  Did the patient bring in their glucometer from home?  No  How often do you monitor your CBG's?  One to two times daily.   Financial Strains and Diabetes Management:  Are you having any financial strains with the device, your supplies or your medication? No .  Does the patient want to be seen by Chronic Care Management for management of their diabetes?  No  Would the patient like to be referred to a Nutritionist or for Diabetic Management?  No   Diabetic Exams:  Diabetic Eye Exam: Completed 02/16/19 per pt. Repeat yearly.  Diabetic Foot Exam: Currently due. Pt has been advised about the importance in completing this exam. Note made to follow up on this at next in office visit.     Interpreter Needed?: No  Information entered by :: [facility], LPN  Past Medical History:  Diagnosis Date  . Depression   . Diabetes mellitus without complication ([place])   . History of deep vein thrombosis    about 30 years ago  . Hyperlipidemia 07/06/2005  . Hypertension    Past Surgical History:  Procedure Laterality Date  . LEG AMPUTATION Right    Family History  Problem Relation Age of Onset  . Kidney failure Mother   . Heart attack Sister        Hx of MI  . Diabetes Brother        borderline diabetes  . Cancer Maternal Aunt        Ovarian cancer  . Breast cancer Daughter    Social History   Socioeconomic History  . Marital status: Widowed    Spouse name: Not on file  . Number of children:  3  . Years of education: Not on file  . Highest education level: 12th grade  Occupational History  . Occupation: Retired  Scientific laboratory technician  . Financial resource strain: Not hard at all  . Food insecurity    Worry: Never true    Inability: Never true  . Transportation needs    Medical: No    Non-medical: No  Tobacco Use  . Smoking status: Never Smoker  . Smokeless tobacco: Never Used  . Tobacco comment: smoking cessation materials not required  Substance and Sexual Activity  . Alcohol use: No  . Drug use: No  . Sexual activity: Not on file  Lifestyle  . Physical activity    Days per  week: 0 days    Minutes per session: 0 min  . Stress: Not at all  Relationships  . Social Herbalist on phone: Patient refused    Gets together: Patient refused    Attends religious service: Patient refused    Active member of club or organization: Patient refused    Attends meetings of clubs or organizations: Patient refused    Relationship status: Patient refused  Other Topics Concern  . Not on file  Social History Narrative   Lives at home by herself. Has a wheelchair. Daughter checks on her every day    Outpatient Encounter Medications as of 02/17/2019  Medication Sig  . amLODipine (NORVASC) 10 MG tablet Take 1 tablet (10 mg total) by mouth daily.  Marland Kitchen aspirin (ASPIRIN CHILDRENS) 81 MG chewable tablet Chew 1 tablet (81 mg total) by mouth daily.  Marland Kitchen atorvastatin (LIPITOR) 40 MG tablet Take 1 tablet (40 mg total) by mouth daily at 6 PM.  . diazepam (VALIUM) 2 MG tablet Take 0.5-1 tablets (1-2 mg total) by mouth every 6 (six) hours as needed for anxiety.  Marland Kitchen glipiZIDE (GLUCOTROL) 10 MG tablet Take 1 tablet (10 mg total) by mouth daily.  Marland Kitchen losartan (COZAAR) 25 MG tablet Take 1 tablet (25 mg total) by mouth once daily  . metoprolol tartrate (LOPRESSOR) 50 MG tablet Take 1 tablet (50 mg total) by mouth 2 (two) times daily.  Marland Kitchen warfarin (COUMADIN) 1 MG tablet Take 1 tablet (1 mg total) by mouth daily at 6 PM.  . warfarin (COUMADIN) 2 MG tablet Take 1 tablet (2 mg total) by mouth daily.  Marland Kitchen gabapentin (NEURONTIN) 300 MG capsule Take 1 capsule (300 mg total) by mouth 2 (two) times daily. (Patient not taking: Reported on 12/25/2018)  . meclizine (ANTIVERT) 25 MG tablet Take 1 tablet (25 mg total) by mouth 3 (three) times daily. (Patient not taking: Reported on 02/13/2018)  . polyethylene glycol (MIRALAX / GLYCOLAX) packet Take 17 g by mouth daily. (Patient not taking: Reported on 12/25/2018)  . scopolamine (TRANSDERM-SCOP) 1 MG/3DAYS Place 1 patch (1.5 mg total) onto the skin every 3 (three)  days. (Patient not taking: Reported on 02/13/2018)   No facility-administered encounter medications on file as of 02/17/2019.     Activities of Daily Living In your present state of health, do you have any difficulty performing the following activities: 02/17/2019  Hearing? Y  Comment Does not wear hearing aids.  Vision? Y  Comment Due to MD.  Difficulty concentrating or making decisions? N  Walking or climbing stairs? Y  Comment Is wheelchair bound.  Dressing or bathing? N  Doing errands, shopping? Y  Comment Does not drive.  Preparing Food and eating ? N  Using the Toilet? N  In the  past six months, have you accidently leaked urine? Y  Comment Due to urgency.  Do you have problems with loss of bowel control? N  Managing your Medications? Y  Comment Daughter fixes medications.  Managing your Finances? N  Housekeeping or managing your Housekeeping? N  Some recent data might be hidden    Patient Care Team: [name]., MD as PCP - General (Family Medicine) Cathi Roan, [facility] (Pharmacist) [facility], [facility]) Isaias Sakai, MD as Referring Physician (Ophthalmology)    Assessment:   This is a routine wellness examination for Melanie Diaz.  Exercise Activities and Dietary recommendations Current Exercise Habits: The patient does not participate in regular exercise at present, Exercise limited by: orthopedic condition(s)  Goals      Patient Stated   . I'm ready to switch to Eliquis, but it is expensive (pt-stated)     Current Barriers:  . financial  Pharmacist Clinical Goal(s): Over the next 14 days, Melanie Diaz will provide the necessary supplementary documents (proof of out of pocket prescription expenditure, proof of household income) needed for medication assistance applications to CCM pharmacist.    Interventions: . CCM pharmacist will apply for medication assistance program for Eliquis made by [employer].  Marland Kitchen Extensive counseling on  benefits of Eliquis over warfarin . Patient educated on purpose, proper use and potential adverse effects of Eliquis.   Updated 12/02/18: Melanie Diaz has provided completed paperwork and application to submit to [facility]MS; CCM pharmacist submitted application to [facility]MS via fax, uploaded under media tab  Updated 9/15: patient signature obtained at INR/PT appointment, submitted application to BMS  Updated 9/28: BMS needs copy of Medicare card but there is not one on file in EMR; contacted patient to provide ID #, she will call back; will update with BMS once patient provides ID  Updated 10/5: patient bringing Medicare card to PT/INR visit this afternoon; updated BMS with Medicare number  Patient Self Care Activities:  . Take all medications as prescribed . Monitor mail closely for communication from BMS  Please see past updates related to this goal by clicking on the "Past Updates" button in the selected goal         Other   . DIET - INCREASE WATER INTAKE     Recommend to drink at least 6-8 8oz glasses of water per day.    . Prevent falls     Recommend to remove any items from the home that may cause slips or trips.       Fall Risk: Fall Risk  02/17/2019 02/13/2018 11/22/2016  Falls in the past year? 0 No No  Number falls in past yr: 0 - -  Injury with Fall? 0 - -  Risk for fall due to : - Medication side effect;Impaired balance/gait;Impaired vision -  Risk for fall due to: Comment - Valium; wears eyeglasses; use of w/c and walker -    FALL RISK PREVENTION PERTAINING TO THE HOME:  Any stairs in or around the home? No  If so, are there any without handrails? N/A  Home free of loose throw rugs in walkways, pet beds, electrical cords, etc? Yes  Adequate lighting in your home to reduce risk of falls? Yes   ASSISTIVE DEVICES UTILIZED TO PREVENT FALLS:  Life alert? Yes  Use of a cane, walker or w/c? Yes  Grab bars in the bathroom? Yes  Shower chair or bench in shower? Yes  Elevated  toilet seat or a handicapped toilet? No  TIMED UP AND GO:  Was the test performed? No .    Depression Screen PHQ 2/9 Scores 02/17/2019 02/13/2018 11/22/2016  PHQ - 2 Score 0 1 1  PHQ- 9 Score - 8 4     Cognitive Function: Declined today.         Immunization History  Administered Date(s) Administered  . Pneumococcal Conjugate-13 03/25/2014  . Pneumococcal Polysaccharide-23 02/13/2018  . Tdap 03/25/2014    Qualifies for Shingles Vaccine? Yes  . Due for Shingrix. Education has been provided regarding the importance of this vaccine. Pt has been advised to call insurance company to determine out of pocket expense. Advised may also receive vaccine at local pharmacy or Health Dept. Verbalized acceptance and understanding.  Tdap: Up to date  Flu Vaccine: Due for Flu vaccine. Does the patient want to receive this vaccine today?  No .   Pneumococcal Vaccine: Completed series  Screening Tests Health Maintenance  Topic Date Due  . FOOT EXAM  11/26/1943  . DEXA SCAN  11/26/2000  . INFLUENZA VACCINE  08/12/2019 (Originally 12/13/2018)  . HEMOGLOBIN A1C  06/27/2019  . OPHTHALMOLOGY EXAM  02/16/2020  . TETANUS/TDAP  03/25/2024  . PNA vac Low Risk Adult  Completed    Cancer Screenings:  Colorectal Screening: No longer required.   Mammogram: No longer required.   Bone Density: Currently due. Declined referral today.   Lung Cancer Screening: (Low Dose CT Chest recommended if Age 4-80 years, 30 pack-year currently smoking OR have quit w/in 15years.) does not qualify.   Additional Screening:  Dental Screening: Recommended annual dental exams for proper oral hygiene   Community Resource Referral:  CRR required this visit? Yes, referral sent for transportation assistance.      Plan:  I have personally reviewed and addressed the Medicare Annual Wellness questionnaire and have noted the following in the patient's chart:  A. Medical and social history B. Use of alcohol,  tobacco or illicit drugs  C. Current medications and supplements D. Functional ability and status E.  Nutritional status F.  Physical activity G. Advance directives H. List of other physicians I.  Hospitalizations, surgeries, and ER visits in previous 12 months J.  [place] such as hearing and vision if needed, cognitive and depression L. Referrals and appointments   In addition, I have reviewed and discussed with patient certain preventive protocols, quality metrics, and best practice recommendations. A written personalized care plan for preventive services as well as general preventive health recommendations were provided to patient.   Glendora Score, [place]  [id number] Nurse Health Advisor   Nurse Notes: Pt needs a diabetic foot exam at next in office apt.

## 2019-02-17 ENCOUNTER — Ambulatory Visit (INDEPENDENT_AMBULATORY_CARE_PROVIDER_SITE_OTHER): Payer: Medicare Other

## 2019-02-17 DIAGNOSIS — Z0000 Encounter for general adult medical examination without abnormal findings: Secondary | ICD-10-CM

## 2019-02-17 NOTE — Patient Instructions (Addendum)
Melanie Diaz , Thank you for taking time to come for your Medicare Wellness Visit. I appreciate your ongoing commitment to your health goals. Please review the following plan we discussed and let me know if I can assist you in the future.   Screening recommendations/referrals: Colonoscopy: No longer required.  Mammogram: No longer required.  Bone Density: Currently due. Pt declined a referral today.  Recommended yearly ophthalmology/optometry visit for glaucoma screening and checkup Recommended yearly dental visit for hygiene and checkup  Vaccinations: Influenza vaccine: Pt declines today.  Pneumococcal vaccine: Completed series Tdap vaccine: Up to date, due 03/2024 Shingles vaccine: Pt declines today.     Advanced directives: Advance directive discussed with you today. Even though you declined this today please call our office should you change your mind and we can give you the proper paperwork for you to fill out.  Conditions/risks identified: Recommend to increase water intake to 6-8 8 oz glasses a day.  Next appointment: 03/03/19 for a PT/INR check   Preventive Care 65 Years and Older, Female Preventive care refers to lifestyle choices and visits with your health care provider that can promote health and wellness. What does preventive care include?  A yearly physical exam. This is also called an annual well check.  Dental exams once or twice a year.  Routine eye exams. Ask your health care provider how often you should have your eyes checked.  Personal lifestyle choices, including:  Daily care of your teeth and gums.  Regular physical activity.  Eating a healthy diet.  Avoiding tobacco and drug use.  Limiting alcohol use.  Practicing safe sex.  Taking low-dose aspirin every day.  Taking vitamin and mineral supplements as recommended by your health care provider. What happens during an annual well check? The services and screenings done by your health care provider  during your annual well check will depend on your age, overall health, lifestyle risk factors, and family history of disease. Counseling  Your health care provider may ask you questions about your:  Alcohol use.  Tobacco use.  Drug use.  Emotional well-being.  Home and relationship well-being.  Sexual activity.  Eating habits.  History of falls.  Memory and ability to understand (cognition).  Work and work Statistician.  Reproductive health. Screening  You may have the following tests or measurements:  Height, weight, and BMI.  Blood pressure.  Lipid and cholesterol levels. These may be checked every 5 years, or more frequently if you are over 65 years old.  Skin check.  Lung cancer screening. You may have this screening every year starting at age 19 if you have a 30-pack-year history of smoking and currently smoke or have quit within the past 15 years.  Fecal occult blood test (FOBT) of the stool. You may have this test every year starting at age 97.  Flexible sigmoidoscopy or colonoscopy. You may have a sigmoidoscopy every 5 years or a colonoscopy every 10 years starting at age 44.  Hepatitis C blood test.  Hepatitis B blood test.  Sexually transmitted disease (STD) testing.  Diabetes screening. This is done by checking your blood sugar (glucose) after you have not eaten for a while (fasting). You may have this done every 1-3 years.  Bone density scan. This is done to screen for osteoporosis. You may have this done starting at age 58.  Mammogram. This may be done every 1-2 years. Talk to your health care provider about how often you should have regular mammograms. Talk with your  health care provider about your test results, treatment options, and if necessary, the need for more tests. Vaccines  Your health care provider may recommend certain vaccines, such as:  Influenza vaccine. This is recommended every year.  Tetanus, diphtheria, and acellular pertussis  (Tdap, Td) vaccine. You may need a Td booster every 10 years.  Zoster vaccine. You may need this after age 18.  Pneumococcal 13-valent conjugate (PCV13) vaccine. One dose is recommended after age 9.  Pneumococcal polysaccharide (PPSV23) vaccine. One dose is recommended after age 70. Talk to your health care provider about which screenings and vaccines you need and how often you need them. This information is not intended to replace advice given to you by your health care provider. Make sure you discuss any questions you have with your health care provider. Document Released: 05/27/2015 Document Revised: 01/18/2016 Document Reviewed: 03/01/2015 Elsevier Interactive Patient Education  2017 [place] Prevention in the Home Falls can cause injuries. They can happen to people of all ages. There are many things you can do to make your home safe and to help prevent falls. What can I do on the outside of my home?  Regularly fix the edges of walkways and driveways and fix any cracks.  Remove anything that might make you trip as you walk through a door, such as a raised step or threshold.  Trim any bushes or trees on the path to your home.  Use [place] outdoor lighting.  Clear any walking paths of anything that might make someone trip, such as rocks or tools.  Regularly check to see if handrails are loose or broken. Make sure that both sides of any steps have handrails.  Any raised decks and porches should have guardrails on the edges.  Have any leaves, snow, or ice cleared regularly.  Use sand or salt on walking paths during winter.  Clean up any spills in your garage right away. This includes oil or grease spills. What can I do in the bathroom?  Use night lights.  Install grab bars by the toilet and in the tub and shower. Do not use towel bars as grab bars.  Use non-skid mats or decals in the tub or shower.  If you need to sit down in the shower, use a plastic, non-slip  stool.  Keep the floor dry. Clean up any water that spills on the floor as soon as it happens.  Remove soap buildup in the tub or shower regularly.  Attach bath mats securely with double-sided non-slip rug tape.  Do not have throw rugs and other things on the floor that can make you trip. What can I do in the bedroom?  Use night lights.  Make sure that you have a light by your bed that is easy to reach.  Do not use any sheets or blankets that are too big for your bed. They should not hang down onto the floor.  Have a firm chair that has side arms. You can use this for support while you get dressed.  Do not have throw rugs and other things on the floor that can make you trip. What can I do in the kitchen?  Clean up any spills right away.  Avoid walking on wet floors.  Keep items that you use a lot in easy-to-reach places.  If you need to reach something above you, use a strong step stool that has a grab bar.  Keep electrical cords out of the way.  Do not use  floor polish or wax that makes floors slippery. If you must use wax, use non-skid floor wax.  Do not have throw rugs and other things on the floor that can make you trip. What can I do with my stairs?  Do not leave any items on the stairs.  Make sure that there are handrails on both sides of the stairs and use them. Fix handrails that are broken or loose. Make sure that handrails are as long as the stairways.  Check any carpeting to make sure that it is firmly attached to the stairs. Fix any carpet that is loose or worn.  Avoid having throw rugs at the top or bottom of the stairs. If you do have throw rugs, attach them to the floor with carpet tape.  Make sure that you have a light switch at the top of the stairs and the bottom of the stairs. If you do not have them, ask someone to add them for you. What else can I do to help prevent falls?  Wear shoes that:  Do not have high heels.  Have rubber bottoms.  Are  comfortable and fit you well.  Are closed at the toe. Do not wear sandals.  If you use a stepladder:  Make sure that it is fully opened. Do not climb a closed stepladder.  Make sure that both sides of the stepladder are locked into place.  Ask someone to hold it for you, if possible.  Clearly mark and make sure that you can see:  Any grab bars or handrails.  First and last steps.  Where the edge of each step is.  Use tools that help you move around (mobility aids) if they are needed. These include:  Canes.  Walkers.  Scooters.  Crutches.  Turn on the lights when you go into a dark area. Replace any light bulbs as soon as they burn out.  Set up your furniture so you have a clear path. Avoid moving your furniture around.  If any of your floors are uneven, fix them.  If there are any pets around you, be aware of where they are.  Review your medicines with your doctor. Some medicines can make you feel dizzy. This can increase your chance of falling. Ask your doctor what other things that you can do to help prevent falls. This information is not intended to replace advice given to you by your health care provider. Make sure you discuss any questions you have with your health care provider. Document Released: 02/24/2009 Document Revised: 10/06/2015 Document Reviewed: 06/04/2014 Elsevier Interactive Patient Education  2017 [employer].

## 2019-02-19 ENCOUNTER — Ambulatory Visit: Payer: Self-pay | Admitting: Pharmacist

## 2019-02-19 ENCOUNTER — Telehealth: Payer: Self-pay | Admitting: Family Medicine

## 2019-02-19 NOTE — Telephone Encounter (Signed)
Returned patient's call. Left HIPAA complaint voice message, along with my call back number.   [name], PharmD Clinical Pharmacist [place] [phone]

## 2019-02-19 NOTE — Telephone Encounter (Signed)
Calling to speak with Almyra Free. Wanting a call back at [phone].  Thanks, [employer]

## 2019-02-19 NOTE — Chronic Care Management (AMB) (Signed)
  Chronic Care Management   Note  02/19/2019 Name: Melanie Diaz MRN: [id number] DOB: 18-Dec-1933  85 y.o. year old female patient of Dr. Miguel Aschoff engaged with CCM pharmacist for Eliquis medication assistance and medication management. Patient called practice, returning patient's call.    Was unable to reach patient via telephone today and have left HIPAA compliant voicemail asking patient to return my call. (unsuccessful outreach #1).  Follow up plan: A HIPPA compliant phone message was left for the patient providing contact information and requesting a return call.  The care management team will reach out to the patient again over the next 5-7 days.   [name], PharmD Clinical Pharmacist [place] [phone]

## 2019-03-03 ENCOUNTER — Ambulatory Visit (INDEPENDENT_AMBULATORY_CARE_PROVIDER_SITE_OTHER): Payer: Medicare Other | Admitting: Family Medicine

## 2019-03-03 ENCOUNTER — Other Ambulatory Visit: Payer: Self-pay

## 2019-03-03 DIAGNOSIS — I82503 Chronic embolism and thrombosis of unspecified deep veins of lower extremity, bilateral: Secondary | ICD-10-CM

## 2019-03-03 LAB — POCT INR
INR: 4.2 — AB (ref 2.0–3.0)
PT: 50.7

## 2019-03-04 ENCOUNTER — Telehealth: Payer: Self-pay | Admitting: *Deleted

## 2019-03-04 NOTE — Telephone Encounter (Signed)
LMOVM for Melanie Diaz to return call. Patient is to hold next coumadin dose. Than take 2.5 mg daily. Will recheck pt/inr in one week. Already scheduled appt for 03/11/2019 at 4:00 pm.

## 2019-03-04 NOTE — Telephone Encounter (Signed)
Advised 

## 2019-03-04 NOTE — Patient Instructions (Signed)
Hold next dose. Than take 2.5 mg daily. Recheck in 1 week.

## 2019-03-11 ENCOUNTER — Other Ambulatory Visit: Payer: Self-pay

## 2019-03-11 ENCOUNTER — Ambulatory Visit (INDEPENDENT_AMBULATORY_CARE_PROVIDER_SITE_OTHER): Payer: Medicare Other | Admitting: Family Medicine

## 2019-03-11 DIAGNOSIS — I82503 Chronic embolism and thrombosis of unspecified deep veins of lower extremity, bilateral: Secondary | ICD-10-CM

## 2019-03-11 LAB — POCT INR: INR: 4.8 — AB (ref 2.0–3.0)

## 2019-03-11 NOTE — Patient Instructions (Signed)
Hold dose today and tomorrow. Than take 2 mg daily. Recheck in 1 week.

## 2019-03-12 ENCOUNTER — Telehealth: Payer: Self-pay

## 2019-03-12 NOTE — Telephone Encounter (Signed)
03/12/2019 Spoke with Melanie Diaz dial-a-ride to get patient entered in the system, patient's home has to be evaluated before transportation is  set up. Patient will be contacted before the visit. Spoke with patient to explain the process. Melanie Diaz [phone]

## 2019-03-18 ENCOUNTER — Ambulatory Visit (INDEPENDENT_AMBULATORY_CARE_PROVIDER_SITE_OTHER): Payer: Medicare Other | Admitting: Family Medicine

## 2019-03-18 ENCOUNTER — Other Ambulatory Visit: Payer: Self-pay

## 2019-03-18 DIAGNOSIS — I82503 Chronic embolism and thrombosis of unspecified deep veins of lower extremity, bilateral: Secondary | ICD-10-CM | POA: Diagnosis not present

## 2019-03-18 LAB — POCT INR
INR: 1.9 — AB (ref 2.0–3.0)
PT: 23.2

## 2019-03-18 NOTE — Patient Instructions (Signed)
No changes. Continue taking 2 mg daily. Recheck in 4 weeks.

## 2019-03-19 ENCOUNTER — Telehealth: Payer: Self-pay

## 2019-03-19 NOTE — Telephone Encounter (Signed)
03/19/2019 Spoke with patient about Dial a Ride transportation, they have been to the home for an assessment.  The patient stated that she is having a ramp installed to make it easier to get to the [place]. The patient has no other needs right now. Ambrose Mantle [phone]

## 2019-03-25 IMAGING — CT CT HEAD W/O CM
3 of 4 series · 14 of 47 positions shown, 16 images · non-contrast
Comparison: 08/10/2017

CLINICAL DATA: Worsening dizziness for the past 5 days, non
positional, with associated nausea. History of DVT. Was on Coumadin
therapy till 3 months ago.

EXAM:
CT HEAD WITHOUT CONTRAST
TECHNIQUE: Contiguous axial images were obtained from the base of the skull
through the vertex without intravenous contrast.

[Series 2: head wo · axial · 0.47mm/px · z∈[-96,+14]mm · 8 of 28 slices shown, 10 images]
[im 3/28  brain]
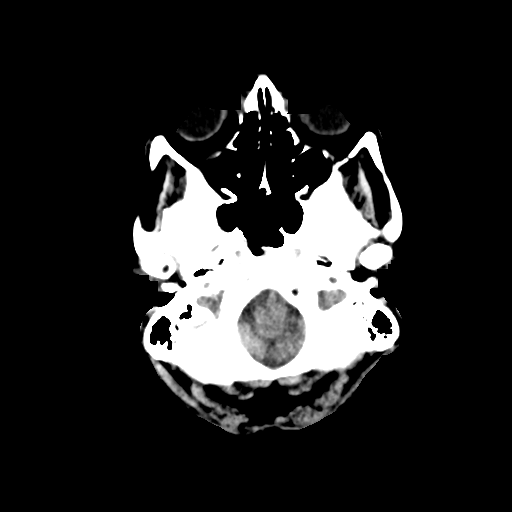
[im 3/28  bone]
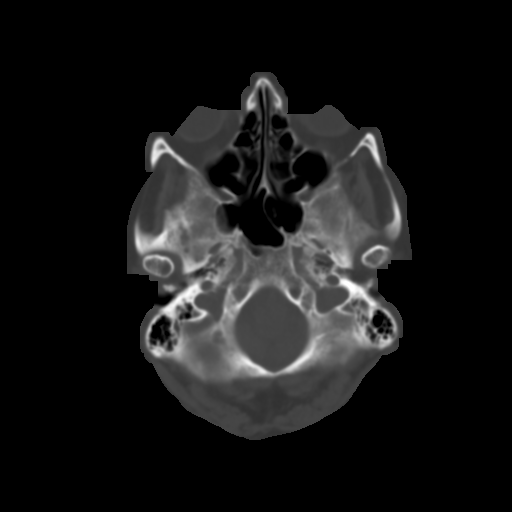
[im 7/28  brain]
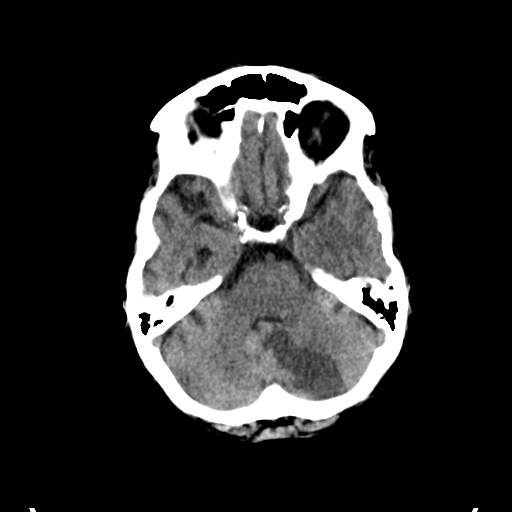
[im 9/28  brain]
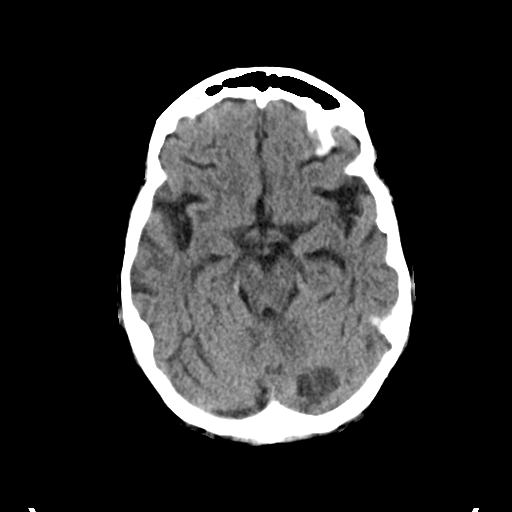
[im 13/28  brain]
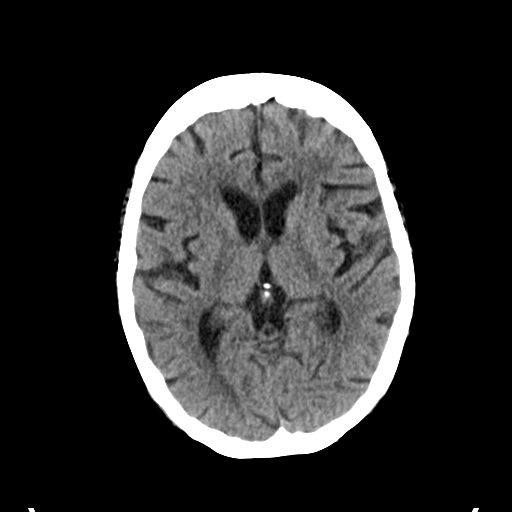
[im 15/28  brain]
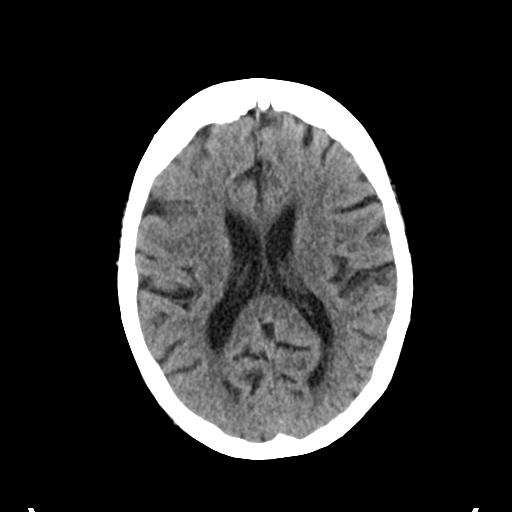
[im 15/28  bone]
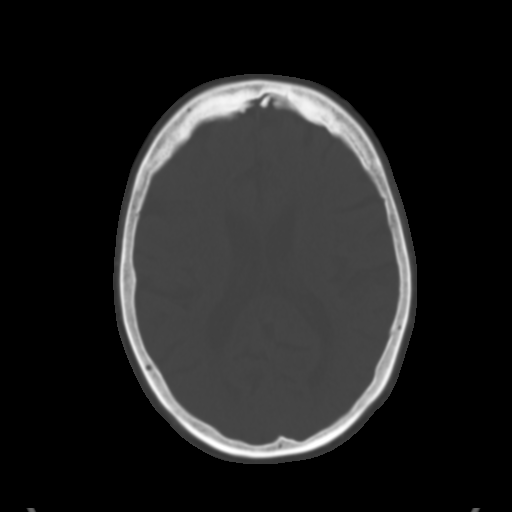
[im 19/28  brain]
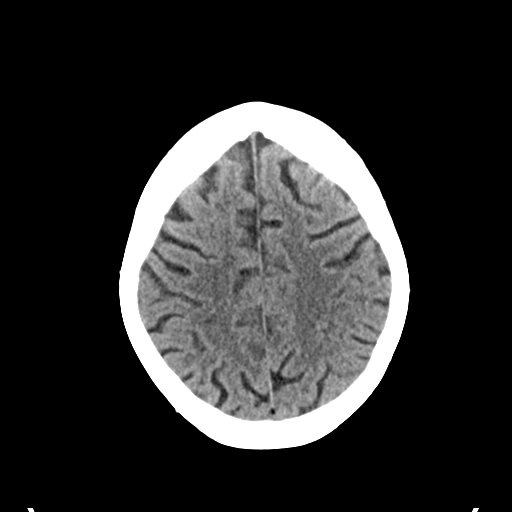
[im 21/28  brain]
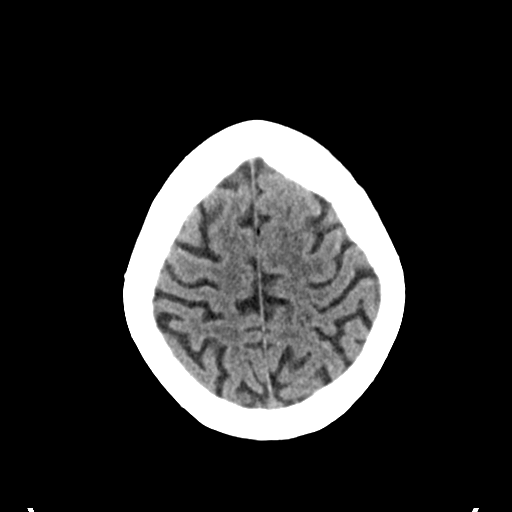
[im 25/28  brain]
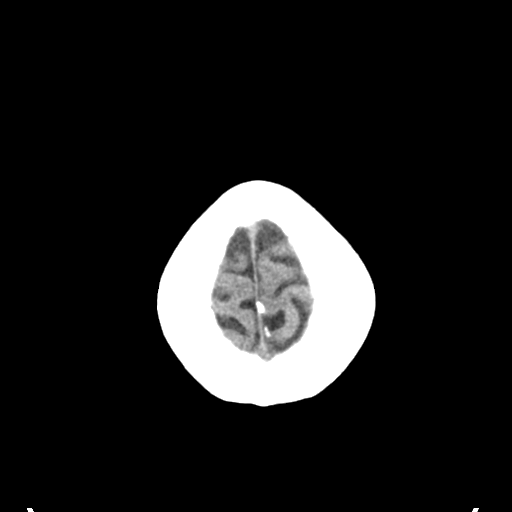

[Series 4: coronal soft tissue · coronal · 0.30mm/px · 3 of 59 slices shown]
[im 20/59  brain]
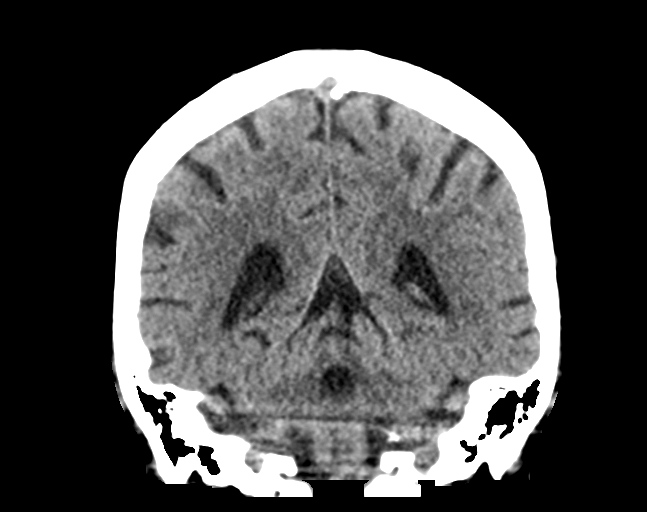
[im 26/59  brain]
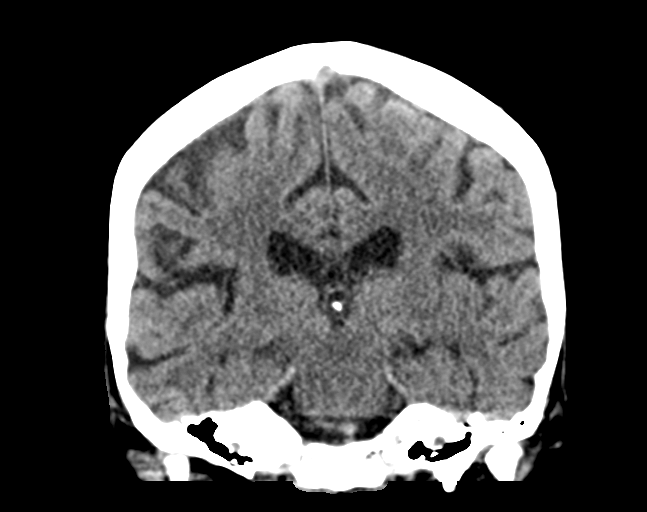
[im 33/59  brain]
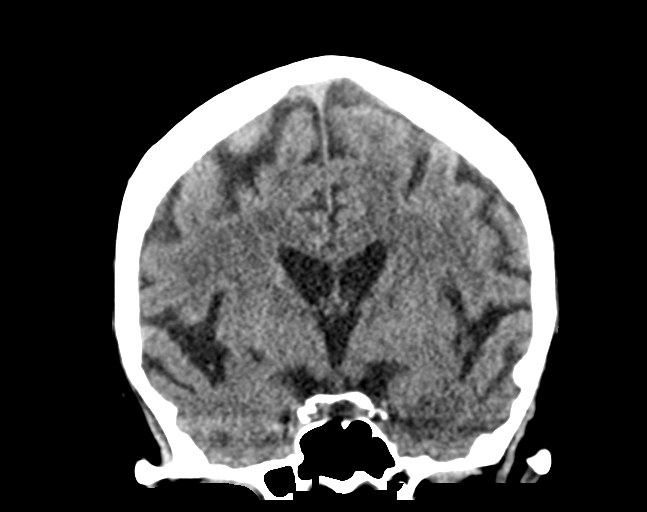

[Series 5: sagittal soft tissue · sagittal · 0.29mm/px · 3 of 50 slices shown]
[im 17/50  brain]
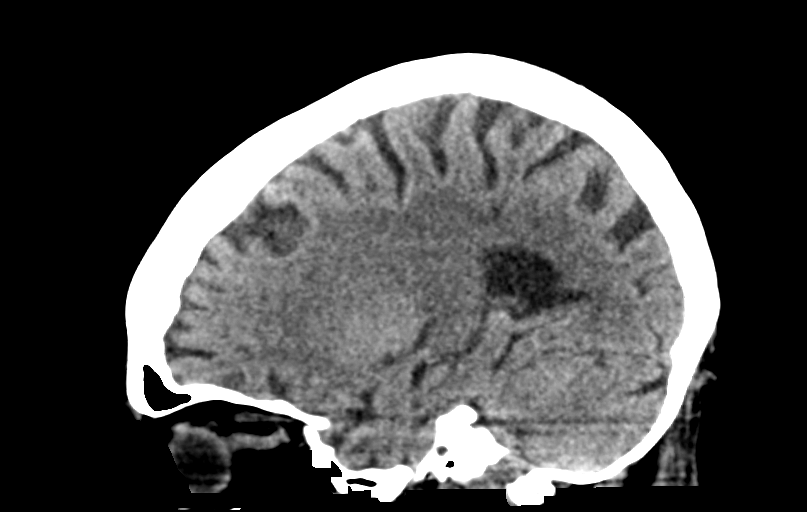
[im 25/50  brain]
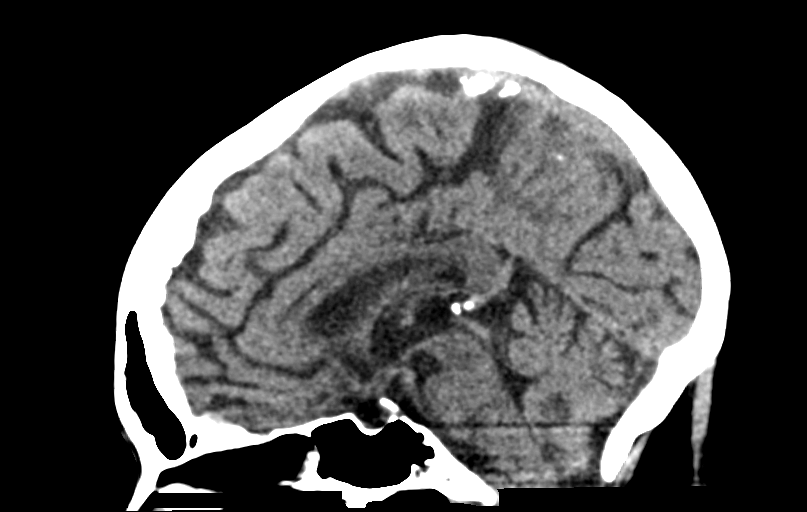
[im 33/50  brain]
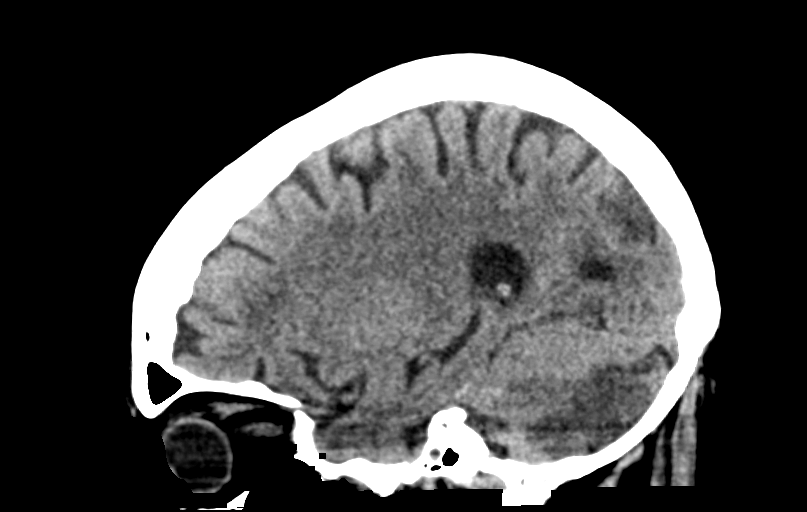

[14 of 47 positions shown; findings below may reference images not displayed]

FINDINGS: Brain: The left PCA distribution Memey Zamzam infarct has evolved,
no better defined. There is no other evidence of a recent infarct.

The ventricles are normal in size and configuration. There are no
parenchymal masses. Slight compression of the lower fourth ventricle
due to the subacute left cerebellar infarct. No other mass effect.

Patchy areas of white matter hypoattenuation are noted consistent
with mild chronic microvascular ischemic change. Old deep white
matter infarct in the right parietal lobe. Old right basal ganglia
lacune infarct.

No extra-axial masses or abnormal fluid collections.

There is no intracranial hemorrhage.

Vascular: No hyperdense vessel or unexpected calcification.

Skull: Normal. Negative for fracture or focal lesion.

Sinuses/Orbits: Globes and orbits are unremarkable. Visualized
sinuses and mastoid air cells are clear.

Other: None.
IMPRESSION: 1. Left cerebellar, PCA distribution, infarct has evolved as
expected since the studies performed 2 days ago. It is better
defined associated with mild mass effect. It is consistent with a
subacute infarct.
2. No new abnormalities. No intracranial hemorrhage. No
hydrocephalus.

## 2019-04-01 ENCOUNTER — Other Ambulatory Visit: Payer: Self-pay | Admitting: Family Medicine

## 2019-04-15 ENCOUNTER — Other Ambulatory Visit: Payer: Self-pay

## 2019-04-15 ENCOUNTER — Ambulatory Visit: Payer: Self-pay | Admitting: Family Medicine

## 2019-04-17 ENCOUNTER — Telehealth: Payer: Self-pay | Admitting: Family Medicine

## 2019-04-17 NOTE — Telephone Encounter (Signed)
Called patient back. Patient states she is having swelling in her arms, hands, legs, and feet. Patient states she has no sores, no redness. Please advise?

## 2019-04-17 NOTE — Telephone Encounter (Signed)
Pt's daughter advised. She states Ms. Savarino will probably refuse to go to the ER, but she will let pt know the recommendations.    Thanks,   -Mickel Baas

## 2019-04-17 NOTE — Telephone Encounter (Signed)
Pt called and stated that she would like a call back from the Dr Rosanna Randy regarding cellulitis and having an Rx sent in. Please advise

## 2019-04-17 NOTE — Telephone Encounter (Signed)
This does not sound like cellulitis. This sounds a lot more concerning for possible heart failure or could be the kidneys. I would recommend patient proceed to ER for evaluation.

## 2019-04-28 ENCOUNTER — Telehealth: Payer: Self-pay | Admitting: Family Medicine

## 2019-04-28 ENCOUNTER — Other Ambulatory Visit: Payer: Self-pay | Admitting: Family Medicine

## 2019-04-28 DIAGNOSIS — I82503 Chronic embolism and thrombosis of unspecified deep veins of lower extremity, bilateral: Secondary | ICD-10-CM

## 2019-04-28 DIAGNOSIS — Z7901 Long term (current) use of anticoagulants: Secondary | ICD-10-CM

## 2019-04-28 NOTE — Telephone Encounter (Signed)
Requested medication (s) are due for refill today: yes  Requested medication (s) are on the active medication list: yes  Last refill:  04/01/2019  Future visit scheduled: no  Notes to clinic:  refill cannot be delegated    Requested Prescriptions  Pending Prescriptions Disp Refills   warfarin (COUMADIN) 2 MG tablet [Pharmacy Med Name: WARFARIN SODIUM 2 MG TABLET] 30 tablet 0    Sig: Take 1 tablet (2 mg total) by mouth daily.      Hematology:  Anticoagulants - warfarin Failed - 04/28/2019 12:08 PM      Failed - This refill cannot be delegated      Failed - If the patient is managed by Coumadin Clinic - route to their Pool. If not, forward to the provider.      Failed - INR in normal range and within 30 days    INR  Date Value Ref Range Status  03/18/2019 1.9 (A) 2.0 - 3.0 Final  07/07/2018 1.8 (A) 0.9 - 1.1 Final  12/25/2011 3.5  Final    Comment:    INR reference interval applies to patients on anticoagulant therapy. A single INR therapeutic range for coumarins is not optimal for all indications; however, the suggested range for most indications is 2.0 - 3.0. Exceptions to the INR Reference Range may include: Prosthetic heart valves, acute myocardial infarction, prevention of myocardial infarction, and combinations of aspirin and anticoagulant. The need for a higher or lower target INR must be assessed individually. Reference: The Pharmacology and Management of the Vitamin K  antagonists: the seventh ACCP Conference on Antithrombotic and Thrombolytic Therapy. [id number] Sept:126 (3suppl): [id number]. A HCT value >55% may artifactually increase the PT.  In one study,  the increase was an average of 25%. Reference:  "Effect on Routine and Special Coagulation Testing Values of Citrate Anticoagulant Adjustment in Patients with High HCT Values." American Journal of Clinical Pathology 2006;126:400-405.    INR (Thromborel-S)  Date Value Ref Range Status  04/16/2018 8.0 (HH)  0.8 - 1.2 Final    Comment:    PT/INR reported using non-recombinant reagent (Thromborel-S) due to interference seen with recombinant reagent (Innovin).           Passed - Valid encounter within last 3 months    Recent Outpatient Visits           2 months ago Essential hypertension   [facility] [name]., [facility]   3 months ago Essential hypertension   [facility] [name]., MD   4 months ago Type 2 diabetes mellitus with other specified complication, without long-term current use of insulin [facility])   [facility] [name]., MD   4 months ago Cellulitis, unspecified cellulitis site   [facility] [name][facility]   11 months ago Chronic deep vein thrombosis (DVT) of both lower extremities, unspecified vein [facility])   [facility] [name]., MD

## 2019-04-28 NOTE — Telephone Encounter (Signed)
Requested medication (s) are due for refill today: yes  Requested medication (s) are on the active medication list: yes  Last refill:  04/01/2019  Future visit scheduled: no  Notes to clinic: review for refill    Requested Prescriptions  Pending Prescriptions Disp Refills   losartan (COZAAR) 25 MG tablet [Pharmacy Med Name: LOSARTAN POTASSIUM 25 MG TAB] 30 tablet 0    Sig: Take 1 tablet (25 mg total) by mouth once daily      Cardiovascular:  Angiotensin Receptor Blockers Failed - 04/28/2019  1:07 PM      Failed - Cr in normal range and within 180 days    Creatinine  Date Value Ref Range Status  12/22/2011 0.42 (L) 0.60 - 1.30 mg/dL Final   Creatinine, Ser  Date Value Ref Range Status  12/25/2018 1.43 (H) 0.57 - 1.00 mg/dL Final          Failed - Last BP in normal range    BP Readings from Last 1 Encounters:  02/16/19 (!) 142/70          Passed - K in normal range and within 180 days    Potassium  Date Value Ref Range Status  12/25/2018 4.0 3.5 - 5.2 mmol/L Final  12/22/2011 3.6 3.5 - 5.1 mmol/L Final          Passed - Patient is not pregnant      Passed - Valid encounter within last 6 months    Recent Outpatient Visits           2 months ago Essential hypertension   [facility] [name]., MD   3 months ago Essential hypertension   [facility] [name]., MD   4 months ago Type 2 diabetes mellitus with other specified complication, without long-term current use of insulin [facility])   [facility] [name]., MD   4 months ago Cellulitis, unspecified cellulitis site   [facility] [name]., MD   11 months ago Chronic deep vein thrombosis (DVT) of both lower extremities, unspecified vein [facility])   [facility] [name]., MD

## 2019-05-04 ENCOUNTER — Ambulatory Visit: Payer: Medicare Other | Admitting: Family Medicine

## 2019-05-25 ENCOUNTER — Other Ambulatory Visit: Payer: Self-pay | Admitting: Family Medicine

## 2019-05-25 DIAGNOSIS — Z7901 Long term (current) use of anticoagulants: Secondary | ICD-10-CM

## 2019-05-25 DIAGNOSIS — I82503 Chronic embolism and thrombosis of unspecified deep veins of lower extremity, bilateral: Secondary | ICD-10-CM

## 2019-05-25 NOTE — Telephone Encounter (Signed)
Daughter, Loistine Chance, called stating patient is still having swelling in her hands and periodically in her legs.   She wants to know if she has blood test would it show if she has congestive heart failure or kidney issues. Please call Angie back.

## 2019-05-25 NOTE — Telephone Encounter (Signed)
Requested medication (s) are due for refill today: yes  Requested medication (s) are on the active medication list: yes  Last refill:  04/28/2019  Future visit scheduled: no  Notes to clinic:  no valid encounter in last 3 months   Requested Prescriptions  Pending Prescriptions Disp Refills   warfarin (COUMADIN) 2 MG tablet [Pharmacy Med Name: WARFARIN SODIUM 2 MG TABLET] 30 tablet 0    Sig: Take 1 tablet (2 mg total) by mouth daily.      Hematology:  Anticoagulants - warfarin Failed - 05/25/2019  3:08 PM      Failed - This refill cannot be delegated      Failed - If the patient is managed by Coumadin Clinic - route to their Pool. If not, forward to the provider.      Failed - INR in normal range and within 30 days    INR  Date Value Ref Range Status  03/18/2019 1.9 (A) 2.0 - 3.0 Final  07/07/2018 1.8 (A) 0.9 - 1.1 Final  12/25/2011 3.5  Final    Comment:    INR reference interval applies to patients on anticoagulant therapy. A single INR therapeutic range for coumarins is not optimal for all indications; however, the suggested range for most indications is 2.0 - 3.0. Exceptions to the INR Reference Range may include: Prosthetic heart valves, acute myocardial infarction, prevention of myocardial infarction, and combinations of aspirin and anticoagulant. The need for a higher or lower target INR must be assessed individually. Reference: The Pharmacology and Management of the Vitamin K  antagonists: the seventh ACCP Conference on Antithrombotic and Thrombolytic Therapy. [id number] Sept:126 (3suppl): [id number]. A HCT value >55% may artifactually increase the PT.  In one study,  the increase was an average of 25%. Reference:  "Effect on Routine and Special Coagulation Testing Values of Citrate Anticoagulant Adjustment in Patients with High HCT Values." American Journal of Clinical Pathology 2006;126:400-405.    INR (Thromborel-S)  Date Value Ref Range Status  04/16/2018 8.0  (HH) 0.8 - 1.2 Final    Comment:    PT/INR reported using non-recombinant reagent (Thromborel-S) due to interference seen with recombinant reagent (Innovin).           Failed - Valid encounter within last 3 months    Recent Outpatient Visits           3 months ago Essential hypertension   [facility] [name]., MD   3 months ago Essential hypertension   [facility] [name]., MD   5 months ago Type 2 diabetes mellitus with other specified complication, without long-term current use of insulin [facility])   [facility] [name]., MD   5 months ago Cellulitis, unspecified cellulitis site   [facility] [name]., MD   1 year ago Chronic deep vein thrombosis (DVT) of both lower extremities, unspecified vein [facility])   [facility] [name]., MD

## 2019-06-08 ENCOUNTER — Other Ambulatory Visit: Payer: Self-pay | Admitting: Family Medicine

## 2019-06-08 DIAGNOSIS — I10 Essential (primary) hypertension: Secondary | ICD-10-CM

## 2019-07-03 ENCOUNTER — Other Ambulatory Visit: Payer: Self-pay | Admitting: Family Medicine

## 2019-07-03 DIAGNOSIS — Z7901 Long term (current) use of anticoagulants: Secondary | ICD-10-CM

## 2019-07-03 DIAGNOSIS — I82503 Chronic embolism and thrombosis of unspecified deep veins of lower extremity, bilateral: Secondary | ICD-10-CM

## 2019-07-03 NOTE — Telephone Encounter (Signed)
Requested medications are due for refill today?  Yes  Requested medications are on active medication list?  Yes  Last Refill:   05/25/2019 30 tablets and no refills  Future visit scheduled?  No  Notes to Clinic:   There is also a Warfarin 1 mg on patient's med list that expired in December 2020.

## 2019-07-13 ENCOUNTER — Telehealth: Payer: Self-pay | Admitting: Family Medicine

## 2019-07-13 ENCOUNTER — Ambulatory Visit: Payer: Self-pay | Admitting: *Deleted

## 2019-07-13 NOTE — Telephone Encounter (Signed)
Melanie Diaz daughter wants to know if she can come by and get a urine specimen cup to have her tested due to UTI symptoms.   Pt does not want to come in unless its necessary. Please call Melanie Diaz back and let her know. [phone]

## 2019-07-13 NOTE — Telephone Encounter (Signed)
Patient's daughter Janace Hoard was advise.

## 2019-07-13 NOTE — Telephone Encounter (Signed)
Patient's daughter Janace Hoard was advised.

## 2019-07-13 NOTE — Telephone Encounter (Signed)
Called pt's son in law and was given contact number for pt's daughter; left message on voicemail

## 2019-07-13 NOTE — Telephone Encounter (Signed)
Please advise 

## 2019-07-13 NOTE — Telephone Encounter (Signed)
Contacted pt and her daughter regarding her blood sugar; she has concern that her blood sugar was low; it was 90 at at 0915; the pt had 2 6 oz bottles of OJ and ate peanut butter crackers and her blood sugar came up to 110 at 1000; they do not know what her blood sugars run; the pt takes Glipizide 10 mg with last dose 07/12/19 at 1830; they say the pt was shaking and could not talk when she got up this am; the pt is having weakness; recommendations made per nurse triage protocol; she verbalized understanding; the pt sees Dr Rosanna Randy, [facility]; the pt and her daughter can be contacted at [phone]; will route to office for final disposition.    Reason for Disposition . [1] Blood glucose < 70 mg/dL (3.9 mmol/L) or symptomatic AND [2] cause known  Answer Assessment - Initial Assessment Questions 1. SYMPTOMS: "What symptoms are you concerned about?"     Low blood sugar 2. ONSET:  "When did the symptoms start?"    07/13/19 3. BLOOD GLUCOSE: "What is your blood glucose level?"      90 4. USUAL RANGE: "What is your blood glucose level usually?" (e.g., usual fasting morning value, usual evening value)   Not sure 5. TYPE 1 or 2:  "Do you know what type of diabetes you have?"  (e.g., Type 1, Type 2, Gestational; doesn't know)     Type 2 6. INSULIN: "Do you take insulin?" "What type of insulin(s) do you use? What is the mode of delivery? (syringe, pen; injection or pump) "When did you last give yourself an insulin dose?" (i.e., time or hours/minutes ago) "How much did you give?" (i.e., how many units)    no 7. DIABETES PILLS: "Do you take any pills for your diabetes?"     glipzide 8. OTHER SYMPTOMS: "Do you have any symptoms?" (e.g., fever, frequent urination, difficulty breathing, vomiting)     Weakness, shaking have subsided 9. LOW BLOOD GLUCOSE TREATMENT: "What have you done so far to treat the low blood glucose level?"     oj x 2 glasses and peanut butter cracker 10. FOOD: "When  did you last eat or drink?"       Chicken strips 07/12/19 at 1900, and oj and peanut butter crackers 07/13/19 at 0930 11. ALONE: "Are you alone right now or is someone with you?"      no 12. PREGNANCY: "Is there any chance you are pregnant?" "When was your last menstrual period?"       no  Protocols used: DIABETES - LOW BLOOD SUGAR-A-AH

## 2019-07-13 NOTE — Telephone Encounter (Signed)
Stop glipizide.  Check blood sugar daily and see me in the next 1 to 2 weeks with these blood sugar readings.

## 2019-07-14 ENCOUNTER — Telehealth: Payer: Self-pay

## 2019-07-14 ENCOUNTER — Other Ambulatory Visit: Payer: Self-pay

## 2019-07-14 DIAGNOSIS — R300 Dysuria: Secondary | ICD-10-CM

## 2019-07-14 LAB — POCT URINALYSIS DIPSTICK
Bilirubin, UA: NEGATIVE
Glucose, UA: POSITIVE — AB
Ketones, UA: NEGATIVE
Leukocytes, UA: NEGATIVE
Nitrite, UA: NEGATIVE
Protein, UA: POSITIVE — AB
Spec Grav, UA: 1.015 (ref 1.010–1.025)
Urobilinogen, UA: 0.2 E.U./dL
pH, UA: 7.5 (ref 5.0–8.0)

## 2019-07-14 NOTE — Telephone Encounter (Signed)
Dr. Rosanna Randy, since this patient has symptoms of fever and fatigue should she be a virtual visit? She is scheduled to come in the office on Wednesday 07/15/2019 at 2pm. Please advise if I need to call patient to change to virtual visit. Also,  If they don't have access to a computer are ou okay with doing a telephone visit?

## 2019-07-14 NOTE — Telephone Encounter (Signed)
I can see her tomorrow at 75 or at 2.

## 2019-07-14 NOTE — Telephone Encounter (Signed)
Copied from [place] [id number]. Topic: General - Inquiry >> Jul 14, 2019 12:58 PM Greggory Keen D wrote: Reason for CRM: Pt's daughter called saying she brought a urine sample over this morning and wanted to add that mom had cellulitis in the last 6 months and wonders if her symptoms of low grade temp and fatigue could be coming from that or a UTI.  CB#  [phone]

## 2019-07-14 NOTE — Telephone Encounter (Signed)
Patient scheduled for appointment.

## 2019-07-15 ENCOUNTER — Ambulatory Visit (INDEPENDENT_AMBULATORY_CARE_PROVIDER_SITE_OTHER): Payer: Medicare Other | Admitting: Family Medicine

## 2019-07-15 ENCOUNTER — Encounter: Payer: Self-pay | Admitting: Family Medicine

## 2019-07-15 ENCOUNTER — Other Ambulatory Visit: Payer: Self-pay

## 2019-07-15 VITALS — BP 187/49 | Temp 98.6°F | Resp 18

## 2019-07-15 DIAGNOSIS — M7989 Other specified soft tissue disorders: Secondary | ICD-10-CM

## 2019-07-15 DIAGNOSIS — E162 Hypoglycemia, unspecified: Secondary | ICD-10-CM

## 2019-07-15 DIAGNOSIS — R0989 Other specified symptoms and signs involving the circulatory and respiratory systems: Secondary | ICD-10-CM

## 2019-07-15 DIAGNOSIS — E1169 Type 2 diabetes mellitus with other specified complication: Secondary | ICD-10-CM | POA: Diagnosis not present

## 2019-07-15 DIAGNOSIS — G459 Transient cerebral ischemic attack, unspecified: Secondary | ICD-10-CM

## 2019-07-15 DIAGNOSIS — R05 Cough: Secondary | ICD-10-CM

## 2019-07-15 DIAGNOSIS — I10 Essential (primary) hypertension: Secondary | ICD-10-CM | POA: Diagnosis not present

## 2019-07-15 DIAGNOSIS — R059 Cough, unspecified: Secondary | ICD-10-CM

## 2019-07-15 DIAGNOSIS — N390 Urinary tract infection, site not specified: Secondary | ICD-10-CM

## 2019-07-15 MED ORDER — SULFAMETHOXAZOLE-TRIMETHOPRIM 400-80 MG PO TABS: 1.0000 | ORAL_TABLET | Freq: Two times a day (BID) | ORAL | 0 refills | Status: DC

## 2019-07-15 MED ORDER — LOSARTAN POTASSIUM 50 MG PO TABS: 50.0000 mg | ORAL_TABLET | Freq: Every day | ORAL | 5 refills | Status: DC

## 2019-07-15 NOTE — Progress Notes (Signed)
Patient: Melanie Diaz Female    DOB: 1934/03/03   86 y.o.   MRN: [id number] Visit Date: 07/15/2019  Today's Provider: Wilhemena Durie, MD   No chief complaint on file.  Subjective:     HPI   Patient is here concerning fatigue and low grade fever. Patient has also has runny nose and shortness of breath for one month. Patient also wants to discuss swelling in hands.  Patient has a long history of [place] of being completely noncompliant with office follow-up. Family called earlier in the week and said she was having a hypoglycemic attack with blood sugars in the 90s.  He has been on glipizide 10 mg for diabetes.  I do not know how old her meter is checking her blood sugar.  Her glipizide was discontinued and her blood sugars have slowly risen from high 90s to 172 and her random sugar today is 217. She is status post right BKA years ago.  Her daughter and son-in-law bring her in today via wheelchair. She is on chronic Coumadin for recurrent DVT. She was  treated for cellulitis of right arm in 2020 per patient and daughter the swelling never went down below the elbow.  Allergies  Allergen Reactions  . Amoxicillin Other (See Comments)    Has patient had a PCN reaction causing immediate rash, facial/tongue/throat swelling, SOB or lightheadedness with hypotension: Unknown Has patient had a PCN reaction causing severe rash involving mucus membranes or skin necrosis: Unknown Has patient had a PCN reaction that required hospitalization: Unknown Has patient had a PCN reaction occurring within the last 10 years: Unknown If all of the above answers are "NO", then may proceed with Cephalosporin use.   . Azithromycin     vomiting and stomach upset     Current Outpatient Medications:  .  amLODipine (NORVASC) 10 MG tablet, Take 1 tablet (10 mg total) by mouth daily., Disp: 90 tablet, Rfl: 0 .  aspirin (ASPIRIN CHILDRENS) 81 MG chewable tablet, Chew 1 tablet (81 mg total) by mouth  daily., Disp: 120 tablet, Rfl: 0 .  atorvastatin (LIPITOR) 40 MG tablet, Take 1 tablet (40 mg total) by mouth daily at 6 PM., Disp: 30 tablet, Rfl: 5 .  diazepam (VALIUM) 2 MG tablet, Take 0.5-1 tablets (1-2 mg total) by mouth every 6 (six) hours as needed for anxiety., Disp: 30 tablet, Rfl: 0 .  gabapentin (NEURONTIN) 300 MG capsule, Take 1 capsule (300 mg total) by mouth 2 (two) times daily. (Patient not taking: Reported on 12/25/2018), Disp: 180 capsule, Rfl: 3 .  glipiZIDE (GLUCOTROL) 10 MG tablet, Take 1 tablet (10 mg total) by mouth daily., Disp: 30 tablet, Rfl: 5 .  losartan (COZAAR) 25 MG tablet, Take 1 tablet (25 mg total) by mouth once daily, Disp: 30 tablet, Rfl: 5 .  meclizine (ANTIVERT) 25 MG tablet, Take 1 tablet (25 mg total) by mouth 3 (three) times daily. (Patient not taking: Reported on 02/13/2018), Disp: 30 tablet, Rfl: 0 .  metoprolol tartrate (LOPRESSOR) 50 MG tablet, Take 1 tablet (50 mg total) by mouth 2 (two) times daily., Disp: 60 tablet, Rfl: 5 .  polyethylene glycol (MIRALAX / GLYCOLAX) packet, Take 17 g by mouth daily. (Patient not taking: Reported on 12/25/2018), Disp: 14 each, Rfl: 0 .  scopolamine (TRANSDERM-SCOP) 1 MG/3DAYS, Place 1 patch (1.5 mg total) onto the skin every 3 (three) days. (Patient not taking: Reported on 02/13/2018), Disp: 4 patch, Rfl: 5 .  warfarin (COUMADIN)  1 MG tablet, Take 1 tablet (1 mg total) by mouth daily at 6 PM., Disp: 30 tablet, Rfl: 11 .  warfarin (COUMADIN) 2 MG tablet, Take 1 tablet (2 mg total) by mouth daily., Disp: 30 tablet, Rfl: 5  Review of Systems  Constitutional: Positive for fatigue and fever. Negative for appetite change and chills.  HENT: Positive for rhinorrhea.   Eyes: Negative.   Respiratory: Negative for chest tightness and shortness of breath.   Cardiovascular: Negative for chest pain and palpitations.  Gastrointestinal: Negative for abdominal pain, nausea and vomiting.  Endocrine: Negative.   Musculoskeletal: Positive  for joint swelling.  Allergic/Immunologic: Negative.   Neurological: Negative for dizziness and weakness.  Hematological: Negative.   Psychiatric/Behavioral: Negative.     Social History   Tobacco Use  . Smoking status: Never Smoker  . Smokeless tobacco: Never Used  . Tobacco comment: smoking cessation materials not required  Substance Use Topics  . Alcohol use: No      Objective:   There were no vitals taken for this visit. There were no vitals filed for this visit.There is no height or weight on file to calculate BMI.   Physical Exam Vitals reviewed.  Constitutional:      Appearance: Normal appearance.  HENT:     Head: Atraumatic.     Right Ear: External ear normal.     Left Ear: External ear normal.     Nose: Nose normal.  Eyes:     General: No scleral icterus.    Comments: Conjunctiva slightly pale.  Cardiovascular:     Rate and Rhythm: Regular rhythm.     Heart sounds: Normal heart sounds.     Comments: Right carotid bruit noted Pulmonary:     Breath sounds: Normal breath sounds.  Abdominal:     Palpations: Abdomen is soft.  Musculoskeletal:     Comments: She is status post right BKA.  The stump appears to be okay.  Left leg and the right forearm have 1+ edema. There is no epitrochlear or right axillary adenopathy regarding the forearm edema.  It  appears to be soft tissue swelling.  Skin:    General: Skin is warm and dry.  Neurological:     Mental Status: She is oriented to person, place, and time. Mental status is at baseline.  Psychiatric:        Mood and Affect: Mood normal.        Thought Content: Thought content normal.        Judgment: Judgment normal.      No results found for any visits on 07/15/19.     Assessment & Plan    1. Type 2 diabetes mellitus with other specified complication, without long-term current use of insulin (HCC) We may need to go back to glipizide at the 5 mg dose.  Consider getting another glucometer.  I do not think  she was hypoglycemic with blood sugars in the 90s.More than 50% 45 minute visit spent in counseling or coordination of care  - CBC w/Diff/Platelet - Hemoglobin A1C - Comprehensive Metabolic Panel (CMET) - TSH - Sed Rate (ESR) - INR/PT  2. Essential hypertension Increase losartan from 25 mg to 50 mg daily.  Blood pressure is high but again patient is famously noncompliant with follow-up.  - CBC w/Diff/Platelet - Hemoglobin A1C - Comprehensive Metabolic Panel (CMET) - TSH - Sed Rate (ESR) - INR/PT - losartan (COZAAR) 50 MG tablet; Take 1 tablet (50 mg total) by mouth daily.  Dispense:  30 tablet; Refill: 5  3. TIA (transient ischemic attack)  - CBC w/Diff/Platelet - Hemoglobin A1C - Comprehensive Metabolic Panel (CMET) - TSH - Sed Rate (ESR) - INR/PT  4. Hypoglycemia Not likely but have stop glipizide for the time being. - CBC w/Diff/Platelet - Hemoglobin A1C - Comprehensive Metabolic Panel (CMET) - TSH - Sed Rate (ESR) - INR/PT  5. Cough Strongly advised family to schedule a Covid test. - CBC w/Diff/Platelet - Hemoglobin A1C - Comprehensive Metabolic Panel (CMET) - TSH - Sed Rate (ESR) - INR/PT - DG Chest 2 View  6. Urinary tract infection without hematuria, site unspecified Start sulfamethoxazole-trimethoprim bid for 1 week.  It is possible she has UTI.  Check culture.  Up in 4 to 6 weeks.  - CBC w/Diff/Platelet - Hemoglobin A1C - Comprehensive Metabolic Panel (CMET) - TSH - Sed Rate (ESR) - INR/PT - sulfamethoxazole-trimethoprim (BACTRIM) 400-80 MG tablet; Take 1 tablet by mouth 2 (two) times daily.  Dispense: 14 tablet; Refill: 0   7. Bruit of right carotid artery Referred to Vascular.  New bruit.  - Ambulatory referral to Vascular Surgery  8. Swelling of arm Referred to Vascular. I think this is soft tissue swelling I am just not sure of etiology.  There does not appear to be any lymphatic function in the right arm. - Ambulatory referral to  Vascular Surgery 9.  Long history of noncompliance with follow-up follow-up in the office  10.  Chronic fatigue Patient says she feels a little worse recently.  Daughter thinks she has a UTI.  Patient denies any urinary tract symptoms.  86 year old his overall health has been poor for several years. Follow up in 6 weeks.   I,Brenee Gajda,acting as a scribe for Wilhemena Durie, MD.,have documented all relevant documentation on the behalf of Wilhemena Durie, MD,as directed by  Wilhemena Durie, MD while in the presence of Wilhemena Durie, MD.       Wilhemena Durie, MD  [facility]

## 2019-07-15 NOTE — Telephone Encounter (Signed)
Yess--thank you.

## 2019-07-16 LAB — COMPREHENSIVE METABOLIC PANEL
ALT: 21 IU/L (ref 0–32)
AST: 17 IU/L (ref 0–40)
Albumin/Globulin Ratio: 1 — ABNORMAL LOW (ref 1.2–2.2)
Albumin: 2.4 g/dL — ABNORMAL LOW (ref 3.6–4.6)
Alkaline Phosphatase: 96 IU/L (ref 39–117)
BUN/Creatinine Ratio: 18 (ref 12–28)
BUN: 32 mg/dL — ABNORMAL HIGH (ref 8–27)
Bilirubin Total: <0.2 mg/dL (ref 0.0–1.2)
CO2: 17 mmol/L — ABNORMAL LOW (ref 20–29)
Calcium: 7.9 mg/dL — ABNORMAL LOW (ref 8.7–10.3)
Chloride: 104 mmol/L (ref 96–106)
Creatinine, Ser: 1.75 mg/dL — ABNORMAL HIGH (ref 0.57–1.00)
GFR calc Af Amer: 31 mL/min/{1.73_m2} — ABNORMAL LOW (ref >59)
GFR calc non Af Amer: 27 mL/min/{1.73_m2} — ABNORMAL LOW (ref >59)
Globulin, Total: 2.4 g/dL (ref 1.5–4.5)
Glucose: 221 mg/dL — ABNORMAL HIGH (ref 65–99)
Potassium: 5.2 mmol/L (ref 3.5–5.2)
Sodium: 131 mmol/L — ABNORMAL LOW (ref 134–144)
Total Protein: 4.8 g/dL — ABNORMAL LOW (ref 6.0–8.5)

## 2019-07-16 LAB — CBC WITH DIFFERENTIAL/PLATELET
Basophils Absolute: 0.1 10*3/uL (ref 0.0–0.2)
Basos: 1 %
EOS (ABSOLUTE): 0.2 10*3/uL (ref 0.0–0.4)
Eos: 2 %
Hematocrit: 22.4 % — ABNORMAL LOW (ref 34.0–46.6)
Hemoglobin: 7.3 g/dL — ABNORMAL LOW (ref 11.1–15.9)
Immature Grans (Abs): 0 10*3/uL (ref 0.0–0.1)
Immature Granulocytes: 0 %
Lymphocytes Absolute: 2.3 10*3/uL (ref 0.7–3.1)
Lymphs: 24 %
MCH: 28.9 pg (ref 26.6–33.0)
MCHC: 32.6 g/dL (ref 31.5–35.7)
MCV: 89 fL (ref 79–97)
Monocytes Absolute: 0.8 10*3/uL (ref 0.1–0.9)
Monocytes: 8 %
Neutrophils Absolute: 6.3 10*3/uL (ref 1.4–7.0)
Neutrophils: 65 %
Platelets: 386 10*3/uL (ref 150–450)
RBC: 2.53 x10E6/uL — CL (ref 3.77–5.28)
RDW: 13.3 % (ref 11.7–15.4)
WBC: 9.6 10*3/uL (ref 3.4–10.8)

## 2019-07-16 LAB — HEMOGLOBIN A1C
Est. average glucose Bld gHb Est-mCnc: 105 mg/dL
Hgb A1c MFr Bld: 5.3 % (ref 4.8–5.6)

## 2019-07-16 LAB — URINE CULTURE

## 2019-07-16 LAB — PROTIME-INR
INR: 4.6 — ABNORMAL HIGH (ref 0.9–1.2)
Prothrombin Time: 45.9 s — ABNORMAL HIGH (ref 9.1–12.0)

## 2019-07-16 LAB — SEDIMENTATION RATE: Sed Rate: 81 mm/hr — ABNORMAL HIGH (ref 0–40)

## 2019-07-16 LAB — TSH: TSH: 7.95 u[IU]/mL — ABNORMAL HIGH (ref 0.450–4.500)

## 2019-07-17 ENCOUNTER — Telehealth: Payer: Self-pay

## 2019-07-17 NOTE — Telephone Encounter (Signed)
Returned call

## 2019-07-17 NOTE — Telephone Encounter (Signed)
Copied from [place] [id number]. Topic: General - Other >> Jul 17, 2019  9:09 AM Celene Kras wrote: Reason for CRM: Pts daughter called and is requesting to have a nurse call her to go over pts blood work. Please advise.

## 2019-07-20 ENCOUNTER — Telehealth: Payer: Self-pay

## 2019-07-20 DIAGNOSIS — F329 Major depressive disorder, single episode, unspecified: Secondary | ICD-10-CM

## 2019-07-20 NOTE — Telephone Encounter (Signed)
-----   Message from [name]., MD sent at 07/20/2019  4:34 PM EST ----- Probably not iron deficiency--will check next week when I see her back.

## 2019-07-20 NOTE — Progress Notes (Signed)
Patient advised.

## 2019-07-20 NOTE — Telephone Encounter (Signed)
Patient's daughter says that she is having a hard time getting Michele to eat anything and she was wondering If you could prescribe her the medication she was previously on to help increase her appetite?

## 2019-07-21 ENCOUNTER — Emergency Department: Payer: Medicare Other

## 2019-07-21 ENCOUNTER — Inpatient Hospital Stay
Admission: EM | Admit: 2019-07-21 | Discharge: 2019-07-28 | DRG: 683 | Disposition: A | Discharge status: Home Health | Payer: Medicare Other | Attending: Internal Medicine | Admitting: Internal Medicine

## 2019-07-21 ENCOUNTER — Inpatient Hospital Stay (HOSPITAL_COMMUNITY)
Admit: 2019-07-21 | Discharge: 2019-07-21 | Disposition: A | Discharge status: Home / Self Care | Payer: Medicare Other | Attending: Internal Medicine | Admitting: Internal Medicine

## 2019-07-21 ENCOUNTER — Telehealth: Payer: Self-pay

## 2019-07-21 ENCOUNTER — Other Ambulatory Visit: Payer: Self-pay

## 2019-07-21 DIAGNOSIS — Z7984 Long term (current) use of oral hypoglycemic drugs: Secondary | ICD-10-CM | POA: Diagnosis not present

## 2019-07-21 DIAGNOSIS — E785 Hyperlipidemia, unspecified: Secondary | ICD-10-CM | POA: Diagnosis not present

## 2019-07-21 DIAGNOSIS — R269 Unspecified abnormalities of gait and mobility: Secondary | ICD-10-CM | POA: Diagnosis not present

## 2019-07-21 DIAGNOSIS — Z7901 Long term (current) use of anticoagulants: Secondary | ICD-10-CM | POA: Diagnosis not present

## 2019-07-21 DIAGNOSIS — R531 Weakness: Secondary | ICD-10-CM | POA: Diagnosis not present

## 2019-07-21 DIAGNOSIS — E139 Other specified diabetes mellitus without complications: Secondary | ICD-10-CM | POA: Diagnosis present

## 2019-07-21 DIAGNOSIS — Z880 Allergy status to penicillin: Secondary | ICD-10-CM

## 2019-07-21 DIAGNOSIS — E1122 Type 2 diabetes mellitus with diabetic chronic kidney disease: Secondary | ICD-10-CM | POA: Diagnosis present

## 2019-07-21 DIAGNOSIS — Z515 Encounter for palliative care: Secondary | ICD-10-CM | POA: Diagnosis not present

## 2019-07-21 DIAGNOSIS — R0789 Other chest pain: Secondary | ICD-10-CM | POA: Diagnosis not present

## 2019-07-21 DIAGNOSIS — N281 Cyst of kidney, acquired: Secondary | ICD-10-CM | POA: Diagnosis not present

## 2019-07-21 DIAGNOSIS — Z993 Dependence on wheelchair: Secondary | ICD-10-CM

## 2019-07-21 DIAGNOSIS — D631 Anemia in chronic kidney disease: Secondary | ICD-10-CM | POA: Diagnosis present

## 2019-07-21 DIAGNOSIS — Z8041 Family history of malignant neoplasm of ovary: Secondary | ICD-10-CM

## 2019-07-21 DIAGNOSIS — I429 Cardiomyopathy, unspecified: Secondary | ICD-10-CM

## 2019-07-21 DIAGNOSIS — Z881 Allergy status to other antibiotic agents status: Secondary | ICD-10-CM | POA: Diagnosis not present

## 2019-07-21 DIAGNOSIS — E440 Moderate protein-calorie malnutrition: Secondary | ICD-10-CM | POA: Insufficient documentation

## 2019-07-21 DIAGNOSIS — R001 Bradycardia, unspecified: Secondary | ICD-10-CM | POA: Diagnosis not present

## 2019-07-21 DIAGNOSIS — R6881 Early satiety: Secondary | ICD-10-CM | POA: Diagnosis not present

## 2019-07-21 DIAGNOSIS — Z803 Family history of malignant neoplasm of breast: Secondary | ICD-10-CM

## 2019-07-21 DIAGNOSIS — N179 Acute kidney failure, unspecified: Secondary | ICD-10-CM | POA: Diagnosis not present

## 2019-07-21 DIAGNOSIS — E872 Acidosis: Secondary | ICD-10-CM | POA: Diagnosis present

## 2019-07-21 DIAGNOSIS — M7989 Other specified soft tissue disorders: Secondary | ICD-10-CM | POA: Diagnosis not present

## 2019-07-21 DIAGNOSIS — E1151 Type 2 diabetes mellitus with diabetic peripheral angiopathy without gangrene: Secondary | ICD-10-CM | POA: Diagnosis not present

## 2019-07-21 DIAGNOSIS — Z862 Personal history of diseases of the blood and blood-forming organs and certain disorders involving the immune mechanism: Secondary | ICD-10-CM

## 2019-07-21 DIAGNOSIS — E871 Hypo-osmolality and hyponatremia: Secondary | ICD-10-CM | POA: Diagnosis not present

## 2019-07-21 DIAGNOSIS — R809 Proteinuria, unspecified: Secondary | ICD-10-CM | POA: Diagnosis not present

## 2019-07-21 DIAGNOSIS — I10 Essential (primary) hypertension: Secondary | ICD-10-CM | POA: Diagnosis present

## 2019-07-21 DIAGNOSIS — Z833 Family history of diabetes mellitus: Secondary | ICD-10-CM | POA: Diagnosis not present

## 2019-07-21 DIAGNOSIS — E860 Dehydration: Secondary | ICD-10-CM | POA: Diagnosis present

## 2019-07-21 DIAGNOSIS — R110 Nausea: Secondary | ICD-10-CM | POA: Diagnosis not present

## 2019-07-21 DIAGNOSIS — R627 Adult failure to thrive: Secondary | ICD-10-CM

## 2019-07-21 DIAGNOSIS — Z8249 Family history of ischemic heart disease and other diseases of the circulatory system: Secondary | ICD-10-CM

## 2019-07-21 DIAGNOSIS — Z89511 Acquired absence of right leg below knee: Secondary | ICD-10-CM

## 2019-07-21 DIAGNOSIS — E039 Hypothyroidism, unspecified: Secondary | ICD-10-CM | POA: Diagnosis present

## 2019-07-21 DIAGNOSIS — T447X5A Adverse effect of beta-adrenoreceptor antagonists, initial encounter: Secondary | ICD-10-CM | POA: Diagnosis not present

## 2019-07-21 DIAGNOSIS — I129 Hypertensive chronic kidney disease with stage 1 through stage 4 chronic kidney disease, or unspecified chronic kidney disease: Secondary | ICD-10-CM | POA: Diagnosis present

## 2019-07-21 DIAGNOSIS — Z86718 Personal history of other venous thrombosis and embolism: Secondary | ICD-10-CM

## 2019-07-21 DIAGNOSIS — Z8673 Personal history of transient ischemic attack (TIA), and cerebral infarction without residual deficits: Secondary | ICD-10-CM | POA: Diagnosis not present

## 2019-07-21 DIAGNOSIS — R630 Anorexia: Secondary | ICD-10-CM | POA: Diagnosis not present

## 2019-07-21 DIAGNOSIS — F329 Major depressive disorder, single episode, unspecified: Secondary | ICD-10-CM | POA: Diagnosis present

## 2019-07-21 DIAGNOSIS — N184 Chronic kidney disease, stage 4 (severe): Secondary | ICD-10-CM | POA: Diagnosis not present

## 2019-07-21 DIAGNOSIS — Z841 Family history of disorders of kidney and ureter: Secondary | ICD-10-CM | POA: Diagnosis not present

## 2019-07-21 DIAGNOSIS — I82409 Acute embolism and thrombosis of unspecified deep veins of unspecified lower extremity: Secondary | ICD-10-CM | POA: Diagnosis present

## 2019-07-21 DIAGNOSIS — I739 Peripheral vascular disease, unspecified: Secondary | ICD-10-CM | POA: Diagnosis present

## 2019-07-21 DIAGNOSIS — Z7189 Other specified counseling: Secondary | ICD-10-CM

## 2019-07-21 DIAGNOSIS — R600 Localized edema: Secondary | ICD-10-CM | POA: Diagnosis not present

## 2019-07-21 DIAGNOSIS — Z20822 Contact with and (suspected) exposure to covid-19: Secondary | ICD-10-CM | POA: Diagnosis present

## 2019-07-21 DIAGNOSIS — T368X5A Adverse effect of other systemic antibiotics, initial encounter: Secondary | ICD-10-CM | POA: Diagnosis present

## 2019-07-21 DIAGNOSIS — J181 Lobar pneumonia, unspecified organism: Secondary | ICD-10-CM | POA: Diagnosis not present

## 2019-07-21 DIAGNOSIS — J90 Pleural effusion, not elsewhere classified: Secondary | ICD-10-CM | POA: Diagnosis not present

## 2019-07-21 DIAGNOSIS — F32A Depression, unspecified: Secondary | ICD-10-CM | POA: Diagnosis present

## 2019-07-21 DIAGNOSIS — N189 Chronic kidney disease, unspecified: Secondary | ICD-10-CM

## 2019-07-21 LAB — BASIC METABOLIC PANEL
Anion gap: 10 (ref 5–15)
BUN: 39 mg/dL — ABNORMAL HIGH (ref 8–23)
CO2: 17 mmol/L — ABNORMAL LOW (ref 22–32)
Calcium: 8 mg/dL — ABNORMAL LOW (ref 8.9–10.3)
Chloride: 103 mmol/L (ref 98–111)
Creatinine, Ser: 2.26 mg/dL — ABNORMAL HIGH (ref 0.44–1.00)
GFR calc Af Amer: 23 mL/min — ABNORMAL LOW (ref >60)
GFR calc non Af Amer: 19 mL/min — ABNORMAL LOW (ref >60)
Glucose, Bld: 143 mg/dL — ABNORMAL HIGH (ref 70–99)
Potassium: 4.3 mmol/L (ref 3.5–5.1)
Sodium: 130 mmol/L — ABNORMAL LOW (ref 135–145)

## 2019-07-21 LAB — URINALYSIS, COMPLETE (UACMP) WITH MICROSCOPIC
Bacteria, UA: NONE SEEN
Bilirubin Urine: NEGATIVE
Glucose, UA: >=500 mg/dL — AB
Hgb urine dipstick: NEGATIVE
Ketones, ur: NEGATIVE mg/dL
Leukocytes,Ua: NEGATIVE
Nitrite: NEGATIVE
Protein, ur: >=300 mg/dL — AB
Specific Gravity, Urine: 1.013 (ref 1.005–1.030)
pH: 6 (ref 5.0–8.0)

## 2019-07-21 LAB — HEPATIC FUNCTION PANEL
ALT: 27 U/L (ref 0–44)
AST: 25 U/L (ref 15–41)
Albumin: 2.2 g/dL — ABNORMAL LOW (ref 3.5–5.0)
Alkaline Phosphatase: 83 U/L (ref 38–126)
Bilirubin, Direct: <0.1 mg/dL (ref 0.0–0.2)
Total Bilirubin: 0.2 mg/dL — ABNORMAL LOW (ref 0.3–1.2)
Total Protein: 5.7 g/dL — ABNORMAL LOW (ref 6.5–8.1)

## 2019-07-21 LAB — TROPONIN I (HIGH SENSITIVITY)
Troponin I (High Sensitivity): 21 ng/L — ABNORMAL HIGH (ref <18)
Troponin I (High Sensitivity): 17 ng/L (ref <18)

## 2019-07-21 LAB — CBC
HCT: 24.4 % — ABNORMAL LOW (ref 36.0–46.0)
Hemoglobin: 8.1 g/dL — ABNORMAL LOW (ref 12.0–15.0)
MCH: 28.2 pg (ref 26.0–34.0)
MCHC: 33.2 g/dL (ref 30.0–36.0)
MCV: 85 fL (ref 80.0–100.0)
Platelets: 388 10*3/uL (ref 150–400)
RBC: 2.87 MIL/uL — ABNORMAL LOW (ref 3.87–5.11)
RDW: 13.8 % (ref 11.5–15.5)
WBC: 7 10*3/uL (ref 4.0–10.5)
nRBC: 0 % (ref 0.0–0.2)

## 2019-07-21 LAB — BRAIN NATRIURETIC PEPTIDE: B Natriuretic Peptide: 668 pg/mL — ABNORMAL HIGH (ref 0.0–100.0)

## 2019-07-21 LAB — T4, FREE: Free T4: 1.08 ng/dL (ref 0.61–1.12)

## 2019-07-21 LAB — GLUCOSE, CAPILLARY: Glucose-Capillary: 142 mg/dL — ABNORMAL HIGH (ref 70–99)

## 2019-07-21 LAB — PROTIME-INR
INR: 1.8 — ABNORMAL HIGH (ref 0.8–1.2)
Prothrombin Time: 20.5 seconds — ABNORMAL HIGH (ref 11.4–15.2)

## 2019-07-21 LAB — HEMOGLOBIN A1C
Hgb A1c MFr Bld: 5.9 % — ABNORMAL HIGH (ref 4.8–5.6)
Mean Plasma Glucose: 122.63 mg/dL

## 2019-07-21 MED ORDER — HYDRALAZINE HCL 20 MG/ML IJ SOLN
10.0000 mg | Freq: Four times a day (QID) | INTRAMUSCULAR | Status: DC | PRN
  Administered 2019-07-21 – 2019-07-23 (×3): 10 mg via INTRAVENOUS
  Filled 2019-07-21 (×3): qty 1

## 2019-07-21 MED ORDER — SODIUM CHLORIDE 0.9 % IV SOLN: INTRAVENOUS | Status: DC

## 2019-07-21 MED ORDER — ONDANSETRON HCL 4 MG PO TABS
4.0000 mg | ORAL_TABLET | Freq: Four times a day (QID) | ORAL | Status: DC | PRN
  Administered 2019-07-22: 4 mg via ORAL
  Filled 2019-07-21: qty 1

## 2019-07-21 MED ORDER — INSULIN ASPART 100 UNIT/ML ~~LOC~~ SOLN
4.0000 [IU] | Freq: Three times a day (TID) | SUBCUTANEOUS | Status: DC
  Administered 2019-07-24 – 2019-07-27 (×5): 4 [IU] via SUBCUTANEOUS
  Filled 2019-07-21 (×12): qty 1

## 2019-07-21 MED ORDER — ACETAMINOPHEN 325 MG PO TABS
650.0000 mg | ORAL_TABLET | Freq: Four times a day (QID) | ORAL | Status: DC | PRN
  Administered 2019-07-22 – 2019-07-26 (×5): 650 mg via ORAL
  Filled 2019-07-21 (×5): qty 2

## 2019-07-21 MED ORDER — MIRTAZAPINE 15 MG PO TABS
30.0000 mg | ORAL_TABLET | Freq: Every day | ORAL | Status: DC
  Administered 2019-07-21 – 2019-07-27 (×7): 30 mg via ORAL
  Filled 2019-07-21 (×7): qty 2

## 2019-07-21 MED ORDER — WARFARIN SODIUM 2 MG PO TABS
2.0000 mg | ORAL_TABLET | Freq: Every day | ORAL | Status: DC
  Administered 2019-07-22: 2 mg via ORAL
  Filled 2019-07-21 (×3): qty 1

## 2019-07-21 MED ORDER — LOSARTAN POTASSIUM 50 MG PO TABS
50.0000 mg | ORAL_TABLET | ORAL | Status: DC
  Administered 2019-07-21 – 2019-07-24 (×4): 50 mg via ORAL
  Filled 2019-07-21 (×4): qty 1

## 2019-07-21 MED ORDER — SODIUM CHLORIDE 0.9 % IV BOLUS
250.0000 mL | Freq: Once | INTRAVENOUS | Status: AC
  Administered 2019-07-21: 250 mL via INTRAVENOUS

## 2019-07-21 MED ORDER — ONDANSETRON HCL 4 MG/2ML IJ SOLN
4.0000 mg | Freq: Four times a day (QID) | INTRAMUSCULAR | Status: DC | PRN
  Administered 2019-07-21 – 2019-07-28 (×7): 4 mg via INTRAVENOUS
  Filled 2019-07-21 (×7): qty 2

## 2019-07-21 MED ORDER — AMLODIPINE BESYLATE 10 MG PO TABS
10.0000 mg | ORAL_TABLET | Freq: Every day | ORAL | Status: DC
  Administered 2019-07-22 – 2019-07-28 (×7): 10 mg via ORAL
  Filled 2019-07-21 (×9): qty 1

## 2019-07-21 MED ORDER — WARFARIN - PHYSICIAN DOSING INPATIENT
Freq: Every day | Status: DC
  Filled 2019-07-21: qty 1

## 2019-07-21 MED ORDER — SODIUM CHLORIDE 0.9% FLUSH: 3.0000 mL | Freq: Once | INTRAVENOUS | Status: DC

## 2019-07-21 MED ORDER — INSULIN ASPART 100 UNIT/ML ~~LOC~~ SOLN
0.0000 [IU] | Freq: Three times a day (TID) | SUBCUTANEOUS | Status: DC
  Administered 2019-07-22: 2 [IU] via SUBCUTANEOUS
  Administered 2019-07-23: 3 [IU] via SUBCUTANEOUS
  Administered 2019-07-23 – 2019-07-28 (×6): 2 [IU] via SUBCUTANEOUS
  Filled 2019-07-21 (×7): qty 1

## 2019-07-21 MED ORDER — ACETAMINOPHEN 650 MG RE SUPP: 650.0000 mg | Freq: Four times a day (QID) | RECTAL | Status: DC | PRN

## 2019-07-21 MED ORDER — LOSARTAN POTASSIUM 50 MG PO TABS: 50.0000 mg | ORAL_TABLET | Freq: Every day | ORAL | Status: DC

## 2019-07-21 MED ORDER — MIRTAZAPINE 30 MG PO TABS: 30.0000 mg | ORAL_TABLET | Freq: Every day | ORAL | 5 refills | Status: DC

## 2019-07-21 MED ORDER — ATORVASTATIN CALCIUM 20 MG PO TABS
40.0000 mg | ORAL_TABLET | Freq: Every day | ORAL | Status: DC
  Administered 2019-07-22 – 2019-07-27 (×6): 40 mg via ORAL
  Filled 2019-07-21 (×6): qty 2

## 2019-07-21 NOTE — Progress Notes (Signed)
Patient arrived to unit. Breathing even and unlabored. No distress noted. All safety measures in place. Bed alarm on. Daughter at bedside. Vital signs assessed, HR is low and BP is elevated. According to ER nurse, Deneise Lever, MD is aware of this, no further interventions at this time. Will alert night shift nurse of this promptly. Patient is settled in. Tele monitor in place.

## 2019-07-21 NOTE — ED Provider Notes (Signed)
[facility] Emergency Department Provider Note    First MD Initiated Contact with Patient 07/21/19 1249     (approximate)  I have reviewed the triage vital signs and the nursing notes.   HISTORY  Chief Complaint Weakness    HPI Melanie Diaz is a 86 y.o. female presents to the ER for evaluation of weakness and generalized malaise.  Also complaining of noting increasing swelling to the right upper extremity.  She is not having chest pain but does feel short of breath.  Denies any history congestive heart failure.  Does take metoprolol and is on Coumadin for history of DVT.  Was recently placed on Bactrim for dysuria.  Last dose of Bactrim was on Sunday.  Has had decreased p.o. intake for the past several days.     Past Medical History:  Diagnosis Date   Depression    Diabetes mellitus without complication ([place])    History of deep vein thrombosis    about 30 years ago   Hyperlipidemia 07/06/2005   Hypertension    Family History  Problem Relation Age of Onset   Kidney failure Mother    Heart attack Sister        Hx of MI   Diabetes Brother        borderline diabetes   Cancer Maternal Aunt        Ovarian cancer   Breast cancer Daughter    Past Surgical History:  Procedure Laterality Date   LEG AMPUTATION Right    Patient Active Problem List   Diagnosis Date Noted   Cerebellar infarct ([place]) 02/17/2018   Stroke (cerebrum) ([place]) 08/10/2017   Chronic anticoagulation 12/03/2016   DVT (deep venous thrombosis) ([place]) 11/22/2016   Hypertension 12/20/2014   Allergic rhinitis 11/16/2014   Cannot sleep 11/16/2014   Peripheral blood vessel disorder ([place]) 11/16/2014   Phantom limb syndrome ([place]) 11/16/2014   Reactive depression (situational) 11/16/2014   Amputation of lower limb ([place]) 11/16/2014   Diabetes mellitus, type 2 ([place]) 11/16/2014   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 Authorizing Provider  amLODipine (NORVASC) 10 MG tablet Take 1 tablet (10 mg total) by mouth daily. 06/08/19  Yes [name]., MD  atorvastatin (LIPITOR) 40 MG tablet Take 1 tablet (40 mg total) by mouth daily at 6 PM. 09/09/18  Yes [name]., MD  glipiZIDE (GLUCOTROL) 10 MG tablet Take 1 tablet (10 mg total) by mouth daily. 01/26/19  Yes [name]., MD  losartan (COZAAR) 50 MG tablet Take 1 tablet (50 mg total) by mouth daily. 07/15/19  Yes [name]., MD  metoprolol tartrate (LOPRESSOR) 25 MG tablet Take 25 mg by mouth every morning.   Yes [provider]  metoprolol tartrate (LOPRESSOR) 50 MG tablet Take 1 tablet (50 mg total) by mouth 2 (two) times daily. Patient taking differently: Take 50 mg by mouth every evening.  12/30/18  Yes [name]., MD  mirtazapine (REMERON) 30 MG tablet Take 1 tablet (30 mg total) by mouth at bedtime. 07/21/19  Yes [name]., MD  warfarin (COUMADIN) 2 MG tablet Take 1 tablet (2 mg total) by mouth daily. 07/06/19  Yes [name]., MD  sulfamethoxazole-trimethoprim (  BACTRIM) 400-80 MG tablet Take 1 tablet by mouth 2 (two) times daily. Patient not taking: Reported on 07/21/2019 07/15/19   [name]., MD    Allergies Amoxicillin and Azithromycin    Social History Social History   Tobacco Use   Smoking status: Never Smoker   Smokeless tobacco: Never Used   Tobacco comment: smoking cessation materials not required  Substance Use Topics   Alcohol use: No   Drug use: No    Review of Systems Patient denies headaches, rhinorrhea, blurry vision, numbness, shortness of breath, chest pain, edema, cough, abdominal pain, nausea, vomiting, diarrhea, dysuria, fevers, rashes or  hallucinations unless otherwise stated above in HPI. ____________________________________________   PHYSICAL EXAM:  VITAL SIGNS: Vitals:   07/21/19 1210 07/21/19 1300  BP: (!) 173/57 (!) 174/56  Pulse: (!) 44 (!) 42  Resp: 19 (!) 21  Temp: 98.4 F (36.9 C)   SpO2: 93% 97%    Constitutional: Alert and oriented.  Frail appearing  Eyes: Conjunctivae are normal.  Head: Atraumatic. Nose: No congestion/rhinnorhea. Mouth/Throat: Mucous membranes are moist.   Neck: No stridor. Painless ROM.  Cardiovascular: slowl rate, regular rhythm. Grossly normal heart sounds.  Good peripheral circulation. Respiratory: Normal respiratory effort.  No retractions. Lungs CTAB. Gastrointestinal: Soft and nontender. No distention. No abdominal bruits. No CVA tenderness. Genitourinary:  Musculoskeletal: ruq pitting edema No lower extremity tenderness nor edema  S/p right BKA.  No joint effusions. Neurologic:  Normal speech and language. No gross focal neurologic deficits are appreciated. No facial droop Skin:  Skin is warm, pale, dry and intact. No rash noted. Psychiatric: Mood and affect are normal. Speech and behavior are normal.  ____________________________________________   LABS (all labs ordered are listed, but only abnormal results are displayed)  Results for orders placed or performed during the hospital encounter of 07/21/19 (from the past 24 hour(s))  Basic metabolic panel     Status: Abnormal   Collection Time: 07/21/19 12:21 PM  Result Value Ref Range   Sodium 130 (L) 135 - 145 mmol/L   Potassium 4.3 3.5 - 5.1 mmol/L   Chloride 103 98 - 111 mmol/L   CO2 17 (L) 22 - 32 mmol/L   Glucose, Bld 143 (H) 70 - 99 mg/dL   BUN 39 (H) 8 - 23 mg/dL   Creatinine, Ser 2.26 (H) 0.44 - 1.00 mg/dL   Calcium 8.0 (L) 8.9 - 10.3 mg/dL   GFR calc non Af Amer 19 (L) >60 mL/min   GFR calc Af Amer 23 (L) >60 mL/min   Anion gap 10 5 - 15  CBC     Status: Abnormal   Collection Time: 07/21/19 12:21 PM   Result Value Ref Range   WBC 7.0 4.0 - 10.5 K/uL   RBC 2.87 (L) 3.87 - 5.11 MIL/uL   Hemoglobin 8.1 (L) 12.0 - 15.0 g/dL   HCT 24.4 (L) 36.0 - 46.0 %   MCV 85.0 80.0 - 100.0 fL   MCH 28.2 26.0 - 34.0 pg   MCHC 33.2 30.0 - 36.0 g/dL   RDW 13.8 11.5 - 15.5 %   Platelets 388 150 - 400 K/uL   nRBC 0.0 0.0 - 0.2 %  Hepatic function panel     Status: Abnormal   Collection Time: 07/21/19 12:21 PM  Result Value Ref Range   Total Protein 5.7 (L) 6.5 - 8.1 g/dL   Albumin 2.2 (L) 3.5 - 5.0 g/dL   AST 25 15 - 41 U/L   ALT  27 0 - 44 U/L   Alkaline Phosphatase 83 38 - 126 U/L   Total Bilirubin 0.2 (L) 0.3 - 1.2 mg/dL   Bilirubin, Direct <0.1 0.0 - 0.2 mg/dL   Indirect Bilirubin NOT CALCULATED 0.3 - 0.9 mg/dL  Troponin I (High Sensitivity)     Status: Abnormal   Collection Time: 07/21/19 12:21 PM  Result Value Ref Range   Troponin I (High Sensitivity) 21 (H) <18 ng/L  Brain natriuretic peptide     Status: Abnormal   Collection Time: 07/21/19 12:21 PM  Result Value Ref Range   B Natriuretic Peptide 668.0 (H) 0.0 - 100.0 pg/mL  Protime-INR     Status: Abnormal   Collection Time: 07/21/19 12:21 PM  Result Value Ref Range   Prothrombin Time 20.5 (H) 11.4 - 15.2 seconds   INR 1.8 (H) 0.8 - 1.2   ____________________________________________  EKG My review and personal interpretation at Time: 12:15   Indication: weakness  Rate: 45  Rhythm: sinus Axis: normal Other: normal intervals, non specific st abn ____________________________________________  RADIOLOGY  I personally reviewed all radiographic images ordered to evaluate for the above acute complaints and reviewed radiology reports and findings.  These findings were personally discussed with the patient.  Please see medical record for radiology report.  ____________________________________________   PROCEDURES  Procedure(s) performed:  .Critical Care Performed by: Merlyn Lot, MD Authorized by: Merlyn Lot, MD    Critical care provider statement:    Critical care time (minutes):  35   Critical care time was exclusive of:  Separately billable procedures and treating other patients   Critical care was necessary to treat or prevent imminent or life-threatening deterioration of the following conditions:  Cardiac failure   Critical care was time spent personally by me on the following activities:  Development of treatment plan with patient or surrogate, discussions with consultants, evaluation of patient's response to treatment, examination of patient, obtaining history from patient or surrogate, ordering and performing treatments and interventions, ordering and review of laboratory studies, ordering and review of radiographic studies, pulse oximetry, re-evaluation of patient's condition and review of old charts      Critical Care performed: yes  ____________________________________________   INITIAL IMPRESSION / [place] / ED COURSE  Pertinent labs & imaging results that were available during my care of the patient were reviewed by me and considered in my medical decision making (see chart for details).   DDX: dehydration, aki, electrolyte abn, uti, sepsis, acs, chf, dysrhythmia  AMILLIA BIFFLE is a 86 y.o. who presents to the ED with symptoms as described above.  Patient noted to be bradycardic but actually hypertensive and is mentating.  Appears chronically ill.  Patient placed on pacer pads.  Placed on cardiac monitor.  Blood work initially drawn triage does show slowly worsening renal function.  Hemoglobin stable.  The patient will be placed on continuous pulse oximetry and telemetry for monitoring.  Laboratory evaluation will be sent to evaluate for the above complaints.     Clinical Course as of Jul 20 1428  Tue Jul 21, 2019  1354 Heart rate improving with IV fluids.  INR is therapeutic.  Suspect bradycardia likely secondary to AKI in secondary to medication in setting of known  metoprolol use.  Pacer pads have been placed patient mains normotensive.  We will continue observe.   [PR]  1410 Blood work does show steadily worsening renal function the past few weeks.  Mildly elevated troponin as well as BNP.  Does have effusion and possible consolidation on chest x-ray but she is not hypoxic and without any fevers to have a lower suspicion for pneumonia.  Will discuss with hospitalist for admission.   [PR]    Clinical Course User Index [PR] Merlyn Lot, MD    The patient was evaluated in Emergency Department today for the symptoms described in the history of present illness. He/she was evaluated in the context of the global COVID-19 pandemic, which necessitated consideration that the patient might be at risk for infection with the SARS-CoV-2 virus that causes COVID-19. Institutional protocols and algorithms that pertain to the evaluation of patients at risk for COVID-19 are in a state of rapid change based on information released by regulatory bodies including the CDC and federal and state organizations. These policies and algorithms were followed during the patient's care in the ED.  As part of my medical decision making, I reviewed the following data within the [place] notes reviewed and incorporated, Labs reviewed, notes from prior ED visits and [place] Controlled Substance Database   ____________________________________________   FINAL CLINICAL IMPRESSION(S) / ED DIAGNOSES  Final diagnoses:  Arm swelling  Weakness  Bradycardia  AKI (acute kidney injury) ([place])      NEW MEDICATIONS STARTED DURING THIS VISIT:  New Prescriptions   No medications on file     Note:  This document was prepared using Dragon voice recognition software and may include unintentional dictation errors.    Merlyn Lot, MD 07/21/19 1430

## 2019-07-21 NOTE — Telephone Encounter (Signed)
Mirtazapine 30mg  q hs. #30,5rf

## 2019-07-21 NOTE — ED Notes (Signed)
Ultrasound at bedside

## 2019-07-21 NOTE — Telephone Encounter (Signed)
Medication sent to pharmacy  

## 2019-07-21 NOTE — Telephone Encounter (Signed)
Copied from [place] [id number]. Topic: General - Other >> Jul 21, 2019 12:35 PM Mcneil, Ja-Kwan wrote: Reason for CRM: Pt son in law stated pt was taken to the hospital and his wife which is the pt daughter needs Arbie Cookey to call them back to discuss pt normal blood pressure reading and heart rate. Cb#[phone]

## 2019-07-21 NOTE — H&P (Signed)
History and Physical    Melanie Diaz MPN:[id number] DOB: May 28, 1933 DOA: 07/21/2019  PCP: [name]., MD   Patient coming from: Home  I have personally briefly reviewed patient's old medical records in [place]  Chief Complaint: Generalized weakness  HPI: Melanie Diaz is a 87 y.o. female with medical history significant for DVT on anticoagulation, hypertension, diabetes mellitus and depression.  Patient was recently treated for UTI with Bactrim.  Brought to emergency room by family members for evaluation of increased swelling involving her right upper extremity.  She denied any recent trauma.  Denied having any chest pain or shortness of breath. Her oral intake has been poor but she denies having any nausea or vomiting.  Denies having any changes in her bowel habits.  She denies urinary frequency, dysuria or dysuria.  Family notes that she has been extremely weak unable to transfer back and forth to her wheelchair. Labs in the emergency room revealed chronic anemia and worsening of her renal function from baseline (1.75 >> 2.26)  ED Course: Patient brought in by family members for evaluation of generalized weakness and noted to be bradycardic in the emergency room with heart rate between 42 and 44.  Patient is on beta-blockers. TSH is also slightly elevated.  Patient will be admitted to the hospital  Review of Systems: As per HPI otherwise 10 point review of systems negative.    Past Medical History:  Diagnosis Date  . Depression   . Diabetes mellitus without complication ([place])   . History of deep vein thrombosis    about 30 years ago  . Hyperlipidemia 07/06/2005  . Hypertension     Past Surgical History:  Procedure Laterality Date  . LEG AMPUTATION Right      reports that she has never smoked. She has never used smokeless tobacco. She reports that she does not drink alcohol or use drugs.  Allergies  Allergen Reactions  . Amoxicillin Other (See  Comments)    Has patient had a PCN reaction causing immediate rash, facial/tongue/throat swelling, SOB or lightheadedness with hypotension: Unknown Has patient had a PCN reaction causing severe rash involving mucus membranes or skin necrosis: Unknown Has patient had a PCN reaction that required hospitalization: Unknown Has patient had a PCN reaction occurring within the last 10 years: Unknown If all of the above answers are "NO", then may proceed with Cephalosporin use.   . Azithromycin     vomiting and stomach upset    Family History  Problem Relation Age of Onset  . Kidney failure Mother   . Heart attack Sister        Hx of MI  . Diabetes Brother        borderline diabetes  . Cancer Maternal Aunt        Ovarian cancer  . Breast cancer Daughter      Prior to Admission medications   Medication Sig Start Date End Date Taking? Authorizing Provider  amLODipine (NORVASC) 10 MG tablet Take 1 tablet (10 mg total) by mouth daily. 06/08/19  Yes [name]., MD  atorvastatin (LIPITOR) 40 MG tablet Take 1 tablet (40 mg total) by mouth daily at 6 PM. 09/09/18  Yes [name]., MD  glipiZIDE (GLUCOTROL) 10 MG tablet Take 1 tablet (10 mg total) by mouth daily. 01/26/19  Yes [name]., MD  losartan (COZAAR) 50 MG tablet Take 1 tablet (50 mg total) by mouth daily. 07/15/19  Yes Rosanna Randy,  Retia Passe., MD  metoprolol tartrate (LOPRESSOR) 25 MG tablet Take 25 mg by mouth every morning.   Yes [provider]  metoprolol tartrate (LOPRESSOR) 50 MG tablet Take 1 tablet (50 mg total) by mouth 2 (two) times daily. Patient taking differently: Take 50 mg by mouth every evening.  12/30/18  Yes [name]., MD  mirtazapine (REMERON) 30 MG tablet Take 1 tablet (30 mg total) by mouth at bedtime. 07/21/19  Yes [name]., MD  warfarin (COUMADIN) 2 MG tablet Take 1 tablet (2 mg total) by mouth daily. 07/06/19  Yes [name]., MD   sulfamethoxazole-trimethoprim (BACTRIM) 400-80 MG tablet Take 1 tablet by mouth 2 (two) times daily. Patient not taking: Reported on 07/21/2019 07/15/19   [name]., MD    Physical Exam: Vitals:   07/21/19 1208 07/21/19 1210 07/21/19 1300 07/21/19 1330  BP:  (!) 173/57 (!) 174/56 (!) 172/64  Pulse:  (!) 44 (!) 42   Resp:  19 (!) 21   Temp:  98.4 F (36.9 C)    SpO2:  93% 97% 97%  Weight: 54.4 kg     Height: 5\' 3"  (1.6 m)        Vitals:   07/21/19 1208 07/21/19 1210 07/21/19 1300 07/21/19 1330  BP:  (!) 173/57 (!) 174/56 (!) 172/64  Pulse:  (!) 44 (!) 42   Resp:  19 (!) 21   Temp:  98.4 F (36.9 C)    SpO2:  93% 97% 97%  Weight: 54.4 kg     Height: 5\' 3"  (1.6 m)       Constitutional: NAD, alert and oriented to person, place and time Eyes: PERRL, lids and conjunctivae normal ENMT: Mucous membranes are moist.  Neck: normal, supple, no masses, no thyromegaly Respiratory: clear to auscultation bilaterally, no wheezing, no crackles. Normal respiratory effort. No accessory muscle use.  Cardiovascular: Regular rate and rhythm, no murmurs / rubs / gallops. No extremity edema. 2+ pedal pulses. No carotid bruits.  Abdomen: no tenderness, no masses palpated. No hepatosplenomegaly. Bowel sounds positive.  Musculoskeletal: no clubbing / cyanosis.  Right BKA, Right upper extremity swelling Skin: no rashes, lesions, ulcers.  Neurologic: No gross focal neurologic deficit. Psychiatric: Normal mood and affect.   Labs on Admission: I have personally reviewed following labs and imaging studies  CBC: Recent Labs  Lab 07/15/19 1538 07/21/19 1221  WBC 9.6 7.0  NEUTROABS 6.3  --   HGB 7.3* 8.1*  HCT 22.4* 24.4*  MCV 89 85.0  PLT 386 841   Basic Metabolic Panel: Recent Labs  Lab 07/15/19 1538 07/21/19 1221  NA 131* 130*  K 5.2 4.3  CL 104 103  CO2 17* 17*  GLUCOSE 221* 143*  BUN 32* 39*  CREATININE 1.75* 2.26*  CALCIUM 7.9* 8.0*   GFR: Estimated Creatinine  Clearance: 15.6 mL/min (A) (by C-G formula based on SCr of 2.26 mg/dL (H)). Liver Function Tests: Recent Labs  Lab 07/15/19 1538 07/21/19 1221  AST 17 25  ALT 21 27  ALKPHOS 96 83  BILITOT <0.2 0.2*  PROT 4.8* 5.7*  ALBUMIN 2.4* 2.2*   No results for input(s): LIPASE, AMYLASE in the last 168 hours. No results for input(s): AMMONIA in the last 168 hours. Coagulation Profile: Recent Labs  Lab 07/15/19 1538 07/21/19 1221  INR 4.6* 1.8*   Cardiac Enzymes: No results for input(s): CKTOTAL, CKMB, CKMBINDEX, TROPONINI in the last 168 hours. BNP (last 3 results) No  results for input(s): PROBNP in the last 8760 hours. HbA1C: No results for input(s): HGBA1C in the last 72 hours. CBG: No results for input(s): GLUCAP in the last 168 hours. Lipid Profile: No results for input(s): CHOL, HDL, LDLCALC, TRIG, CHOLHDL, LDLDIRECT in the last 72 hours. Thyroid Function Tests: No results for input(s): TSH, T4TOTAL, FREET4, T3FREE, THYROIDAB in the last 72 hours. Anemia Panel: No results for input(s): VITAMINB12, FOLATE, FERRITIN, TIBC, IRON, RETICCTPCT in the last 72 hours. Urine analysis:    Component Value Date/Time   COLORURINE YELLOW (A) 07/21/2019 1434   APPEARANCEUR CLEAR (A) 07/21/2019 1434   LABSPEC 1.013 07/21/2019 1434   PHURINE 6.0 07/21/2019 1434   GLUCOSEU >=500 (A) 07/21/2019 1434   HGBUR NEGATIVE 07/21/2019 1434   BILIRUBINUR NEGATIVE 07/21/2019 1434   BILIRUBINUR negative 07/14/2019 1029   KETONESUR NEGATIVE 07/21/2019 1434   PROTEINUR >=300 (A) 07/21/2019 1434   UROBILINOGEN 0.2 07/14/2019 1029   NITRITE NEGATIVE 07/21/2019 1434   LEUKOCYTESUR NEGATIVE 07/21/2019 1434    Radiological Exams on Admission: US Venous Img Upper Uni Right(DVT)  Result Date: 07/21/2019 CLINICAL DATA:  Right upper extremity edema. EXAM: RIGHT UPPER EXTREMITY VENOUS DOPPLER ULTRASOUND TECHNIQUE: Gray-scale sonography with graded compression, as well as color Doppler and duplex ultrasound  were performed to evaluate the upper extremity deep venous system from the level of the subclavian vein and including the jugular, axillary, basilic, radial, ulnar and upper cephalic vein. Spectral Doppler was utilized to evaluate flow at rest and with distal augmentation maneuvers. COMPARISON:  None. FINDINGS: Contralateral Subclavian Vein: Respiratory phasicity is normal and symmetric with the symptomatic side. No evidence of thrombus. Normal compressibility. Internal Jugular Vein: No evidence of thrombus. Normal compressibility, respiratory phasicity and response to augmentation. Subclavian Vein: No evidence of thrombus. Normal compressibility, respiratory phasicity and response to augmentation. Axillary Vein: No evidence of thrombus. Normal compressibility, respiratory phasicity and response to augmentation. Cephalic Vein: No evidence of thrombus. Normal compressibility, respiratory phasicity and response to augmentation. Basilic Vein: No evidence of thrombus. Normal compressibility, respiratory phasicity and response to augmentation. Brachial Veins: No evidence of thrombus. Normal compressibility, respiratory phasicity and response to augmentation. Radial Veins: No evidence of thrombus. Normal compressibility, respiratory phasicity and response to augmentation. Ulnar Veins: No evidence of thrombus. Normal compressibility, respiratory phasicity and response to augmentation. Venous Reflux:  None visualized. Other Findings: No evidence of superficial thrombophlebitis or abnormal fluid collection. IMPRESSION: No evidence of DVT within the right upper extremity. Electronically Signed   By: Aletta Edouard M.D.   On: 07/21/2019 14:40   DG Chest Portable 1 View  Result Date: 07/21/2019 CLINICAL DATA:  Weakness, diabetes, hypertension EXAM: PORTABLE CHEST 1 VIEW COMPARISON:  08/18/2012 FINDINGS: Single frontal view of the chest demonstrates right basilar consolidation and effusion. Left chest is relatively clear. No  pneumothorax. Cardiac silhouette is grossly unremarkable, though partially obscured by the density at the right lung base. No acute bony abnormalities. Significant bilateral shoulder osteoarthritis. IMPRESSION: 1. Right basilar consolidation and moderate right-sided pleural effusion. Electronically Signed   By: Randa Ngo M.D.   On: 07/21/2019 13:19    EKG: Independently reviewed.  Sinus Bradycardia  Assessment/Plan Principal Problem:   Bradycardia Active Problems:   DM (diabetes mellitus), secondary ([facility])   Depression   Hypertension   DVT (deep venous thrombosis) ([facility])   Dehydration   Generalized weakness   History of anemia due to chronic kidney disease   Symptomatic bradycardia   PAD (peripheral artery disease) ([place])  Bradycardia Patient presented for evaluation of generalized weakness and found to have significant bradycardia with heart rate in the 40s Most likely medication induced. Patient was on Metoprolol which will be placed on hold TSH is also elevated at 7.9 Will obtain free T3 and T4 levels Consult cardiology Obtain 2 D Echocardiogram   Diabetes Mellitus with complications of CKD 3 Will place patient on a carbohydrate consistent diet Hold oral hypoglycemic agents Sliding scale coverage with blood sugar checks AC and HS   Dehydration Secondary to poor oral intake Will hydrate patient Repeat renal parameters in am   Hypertension Blood pressure is uncontrolled Continue amlodipine and losartan Add hydralazine 10 mg IV every 6 for SBP > 134mmHg optimize blood pressure control   Anemia of chronic kidney disease Hb is 8.1g/dl Will monitor closely   History of DVT Patient on chronic anticoagulation therapy with Coumadin Continue scheduled Coumadin PT/ INR in a.m.   Generalized weakness Multifactorial and related to bradycardia and dehydration Will request PT evaluation Fall precautions   PAD Status post BKA Continue statins.  Beta-blocker  on hold due to bradycardia  DVT prophylaxis: Patient is on Coumadin Code Status: Full code Family Communication: Plan of care was discussed with patient and her daughter at the bedside. All questions and concerns have been addressed Disposition Plan: Back to previous home environment Consults called: Cardiology    Melanie Hasler MD [facility]     07/21/2019, 5:03 PM

## 2019-07-21 NOTE — ED Notes (Signed)
Per admitting MD this RN to give hydralazine now and hold with amlodipine for this time.

## 2019-07-21 NOTE — Telephone Encounter (Signed)
Returned [employer].  Pt in the ED for CHF.

## 2019-07-21 NOTE — ED Triage Notes (Signed)
Pt comes via POV from home with family. Pt states increased swelling to right arm and right leg that has been going on for 7 months. Pt states weakness that has been going on for about a week.  Family states decreased appetite and maybe dehydration.  Pt also states SOB.  Pt denies any pain.

## 2019-07-21 NOTE — ED Notes (Signed)
New brief placed on pt prior to transport.

## 2019-07-21 NOTE — ED Notes (Signed)
RN at bedside

## 2019-07-22 ENCOUNTER — Inpatient Hospital Stay: Payer: Medicare Other

## 2019-07-22 DIAGNOSIS — R001 Bradycardia, unspecified: Secondary | ICD-10-CM

## 2019-07-22 DIAGNOSIS — E440 Moderate protein-calorie malnutrition: Secondary | ICD-10-CM | POA: Insufficient documentation

## 2019-07-22 LAB — BASIC METABOLIC PANEL
Anion gap: 9 (ref 5–15)
BUN: 37 mg/dL — ABNORMAL HIGH (ref 8–23)
CO2: 16 mmol/L — ABNORMAL LOW (ref 22–32)
Calcium: 7.7 mg/dL — ABNORMAL LOW (ref 8.9–10.3)
Chloride: 106 mmol/L (ref 98–111)
Creatinine, Ser: 2.08 mg/dL — ABNORMAL HIGH (ref 0.44–1.00)
GFR calc Af Amer: 25 mL/min — ABNORMAL LOW (ref >60)
GFR calc non Af Amer: 21 mL/min — ABNORMAL LOW (ref >60)
Glucose, Bld: 104 mg/dL — ABNORMAL HIGH (ref 70–99)
Potassium: 4.1 mmol/L (ref 3.5–5.1)
Sodium: 131 mmol/L — ABNORMAL LOW (ref 135–145)

## 2019-07-22 LAB — CBC
HCT: 23.1 % — ABNORMAL LOW (ref 36.0–46.0)
Hemoglobin: 7.6 g/dL — ABNORMAL LOW (ref 12.0–15.0)
MCH: 28 pg (ref 26.0–34.0)
MCHC: 32.9 g/dL (ref 30.0–36.0)
MCV: 85.2 fL (ref 80.0–100.0)
Platelets: 399 10*3/uL (ref 150–400)
RBC: 2.71 MIL/uL — ABNORMAL LOW (ref 3.87–5.11)
RDW: 13.6 % (ref 11.5–15.5)
WBC: 10.3 10*3/uL (ref 4.0–10.5)
nRBC: 0 % (ref 0.0–0.2)

## 2019-07-22 LAB — GLUCOSE, CAPILLARY
Glucose-Capillary: 87 mg/dL (ref 70–99)
Glucose-Capillary: 130 mg/dL — ABNORMAL HIGH (ref 70–99)
Glucose-Capillary: 105 mg/dL — ABNORMAL HIGH (ref 70–99)
Glucose-Capillary: 128 mg/dL — ABNORMAL HIGH (ref 70–99)

## 2019-07-22 LAB — IRON AND TIBC
Iron: 14 ug/dL — ABNORMAL LOW (ref 28–170)
Saturation Ratios: 8 % — ABNORMAL LOW (ref 10.4–31.8)
TIBC: 185 ug/dL — ABNORMAL LOW (ref 250–450)
UIBC: 171 ug/dL

## 2019-07-22 LAB — RETICULOCYTES
Immature Retic Fract: 12.5 % (ref 2.3–15.9)
RBC.: 2.56 MIL/uL — ABNORMAL LOW (ref 3.87–5.11)
Retic Count, Absolute: 44 10*3/uL (ref 19.0–186.0)
Retic Ct Pct: 1.7 % (ref 0.4–3.1)

## 2019-07-22 LAB — OSMOLALITY, URINE: Osmolality, Ur: 219 mOsm/kg — ABNORMAL LOW (ref 300–900)

## 2019-07-22 LAB — PROTIME-INR
INR: 1.7 — ABNORMAL HIGH (ref 0.8–1.2)
Prothrombin Time: 19.4 seconds — ABNORMAL HIGH (ref 11.4–15.2)

## 2019-07-22 LAB — SARS CORONAVIRUS 2 (TAT 6-24 HRS): SARS Coronavirus 2: NEGATIVE

## 2019-07-22 LAB — LACTIC ACID, PLASMA
Lactic Acid, Venous: 0.5 mmol/L (ref 0.5–1.9)
Lactic Acid, Venous: 0.7 mmol/L (ref 0.5–1.9)

## 2019-07-22 LAB — FOLATE: Folate: 12 ng/mL (ref >5.9)

## 2019-07-22 LAB — ECHOCARDIOGRAM COMPLETE
Height: 63 in
Weight: 1920 oz

## 2019-07-22 LAB — VITAMIN B12: Vitamin B-12: 449 pg/mL (ref 180–914)

## 2019-07-22 LAB — FERRITIN: Ferritin: 31 ng/mL (ref 11–307)

## 2019-07-22 LAB — NA AND K (SODIUM & POTASSIUM), RAND UR
Potassium Urine: 24 mmol/L
Sodium, Ur: 29 mmol/L

## 2019-07-22 LAB — OSMOLALITY: Osmolality: 282 mOsm/kg (ref 275–295)

## 2019-07-22 MED ORDER — SODIUM BICARBONATE 650 MG PO TABS
650.0000 mg | ORAL_TABLET | Freq: Three times a day (TID) | ORAL | Status: DC
  Administered 2019-07-22 – 2019-07-28 (×18): 650 mg via ORAL
  Filled 2019-07-22 (×19): qty 1

## 2019-07-22 MED ORDER — HYDRALAZINE HCL 25 MG PO TABS
25.0000 mg | ORAL_TABLET | Freq: Three times a day (TID) | ORAL | Status: DC
  Administered 2019-07-22 – 2019-07-23 (×3): 25 mg via ORAL
  Filled 2019-07-22 (×3): qty 1

## 2019-07-22 MED ORDER — ADULT MULTIVITAMIN W/MINERALS CH
1.0000 | ORAL_TABLET | Freq: Every day | ORAL | Status: DC
  Administered 2019-07-23 – 2019-07-28 (×6): 1 via ORAL
  Filled 2019-07-22 (×6): qty 1

## 2019-07-22 NOTE — Progress Notes (Addendum)
PROGRESS NOTE    Melanie Diaz  SAY:[id number] DOB: [birth year] DOA: 07/21/2019 PCP: [name]., MD   Brief Narrative:  Melanie Diaz is a 86 y.o. female with medical history significant for DVT on anticoagulation, hypertension, diabetes mellitus and depression.  Patient was recently treated for UTI with Bactrim.  Brought to emergency room by family members for evaluation of increased swelling involving her right upper extremity. Venous Doppler studies were negative for DVT but she found to have bradycardia. Patient was on metoprolol at home, TSH elevated.  Subjective: Patient is complaining of generalized weakness and not feeling well.  Denies any chest pain or shortness of breath, stating that she does not feel well enough to say that she is at her baseline.  Unable to explain any symptoms.  Assessment & Plan:   Principal Problem:   Bradycardia Active Problems:   DM (diabetes mellitus), secondary (HCC)   Depression   Hypertension   DVT (deep venous thrombosis) (HCC)   Dehydration   Generalized weakness   History of anemia due to chronic kidney disease   Symptomatic bradycardia   PAD (peripheral artery disease) (HCC)   Malnutrition of moderate degree  Bradycardia.  Likely secondary to beta-blocker.  Apparently patient was on 2 separate doses of metoprolol 25 mg twice daily and 50 mg twice daily, not sure what she was taking.  Bradycardia improved after holding beta-blocker. Echocardiogram with grade 1 diastolic dysfunction, without any regional wall motion abnormalities, normal EF.  Mildly elevated BNP at 668.  Patient does not appear volume overload, has CKD. TSH elevated at 7.9 with free T4 within normal limit.  Which can be either due to primary hypothyroidism which can also contribute with bradycardia or subclinical hypothyroidism.  Patient is not on any thyroid replacement. -We will check total and free T3. -Her PCP should be able to evaluate and start her on  thyroid replacement if needed. -Continue holding metoprolol for now. -Cardiology was consulted from ED-appreciate their recommendations.  None anion gap metabolic acidosis.  Seems chronic, most likely secondary to her kidney disease.  Lactic acid within normal limit. -Start her on bicarb tablets. -She will need follow-up with nephrology as an outpatient.  Mild hyponatremia.  Sodium at 131.  Serum osmolality within normal limit. -Check urine for urine osmolality and sodium. -Continue to monitor.  AKI with CKD stage IIIb.  Likely prerenal due to bradycardia and poor p.o. intake.  Started improving with IV hydration. -Nephrology consult. -Renal ultrasound. -Continue to monitor. -Diet nephrotoxins.  Generalized weakness.  Be related to her dehydration, bradycardia poor p.o. intake.  She might developing hypothyroidism which can contribute to this complaint.  Worsening renal function and anemia can be contributory. -Get PT/OT evaluation. -Dietitian consult.  Hypertension.  Blood pressure elevated. -Continue home dose of amlodipine and losartan. -Hydralazine 25 mg 3 times a day was added by cardiology today. -Continue to monitor.  Anemia of chronic disease.  Hemoglobin seems stable around 7.6.  No obvious bleeding.  CBC more consistent with anemia of chronic disease. -Check anemia panel.  -Might need EPO.  Type 2 diabetes.  Well-controlled with A1c of 5.9. -Holding home p.o. meds. -Sensitive sliding scale as needed.  Right upper extremity edema with history of DVT.  Venous Doppler negative for VTE.  Patient is on Coumadin with INR of 1.7 today. -Continue Coumadin per pharmacy.  PAD.  S/p right BKA. -Continue statin.  Objective: Vitals:   07/22/19 0916 07/22/19 0925 07/22/19 1002 07/22/19 1248  BP: Marland Kitchen)  174/44 (!) 163/46 (!) 161/45 (!) 156/44  Pulse: 63 61 61 (!) 53  Resp:    19  Temp:      TempSrc:      SpO2:    98%  Weight:      Height:        Intake/Output Summary  (Last 24 hours) at 07/22/2019 1332 Last data filed at 07/22/2019 1016 Gross per 24 hour  Intake 580 ml  Output 700 ml  Net -120 ml   Filed Weights   07/21/19 1208 07/21/19 2046 07/22/19 0433  Weight: 54.4 kg 58.1 kg 57.5 kg    Examination:  General exam: Appears calm and comfortable  Respiratory system: Clear to auscultation. Respiratory effort normal. Cardiovascular system: S1 & S2 heard, RRR. No JVD, murmurs, rubs, gallops or clicks. Gastrointestinal system: Soft, nontender, nondistended, bowel sounds positive. Central nervous system: Alert and oriented. No focal neurological deficits.Symmetric 5 x 5 power. Extremities: Right BKA, no edema, no cyanosis, pulses intact and symmetrical. Skin: No rashes, lesions or ulcers Psychiatry: Judgement and insight appear normal.    DVT prophylaxis: Coumadin Code Status: Full Family Communication: Daughter was updated on phone. Disposition Plan: Home home, lives alone and unable to take care of herself due to worsening generalized weakness.  Might need is SNF placement.  PT ordered.  Consultants:   Cardiology  Nephrology  Procedures:  Antimicrobials:   Data Reviewed: I have personally reviewed following labs and imaging studies  CBC: Recent Labs  Lab 07/15/19 1538 07/21/19 1221 07/22/19 0448  WBC 9.6 7.0 10.3  NEUTROABS 6.3  --   --   HGB 7.3* 8.1* 7.6*  HCT 22.4* 24.4* 23.1*  MCV 89 85.0 85.2  PLT 386 388 462   Basic Metabolic Panel: Recent Labs  Lab 07/15/19 1538 07/21/19 1221 07/22/19 0448  NA 131* 130* 131*  K 5.2 4.3 4.1  CL 104 103 106  CO2 17* 17* 16*  GLUCOSE 221* 143* 104*  BUN 32* 39* 37*  CREATININE 1.75* 2.26* 2.08*  CALCIUM 7.9* 8.0* 7.7*   GFR: Estimated Creatinine Clearance: 16.2 mL/min (A) (by C-G formula based on SCr of 2.08 mg/dL (H)). Liver Function Tests: Recent Labs  Lab 07/15/19 1538 07/21/19 1221  AST 17 25  ALT 21 27  ALKPHOS 96 83  BILITOT <0.2 0.2*  PROT 4.8* 5.7*  ALBUMIN  2.4* 2.2*   No results for input(s): LIPASE, AMYLASE in the last 168 hours. No results for input(s): AMMONIA in the last 168 hours. Coagulation Profile: Recent Labs  Lab 07/15/19 1538 07/21/19 1221 07/22/19 0448  INR 4.6* 1.8* 1.7*   Cardiac Enzymes: No results for input(s): CKTOTAL, CKMB, CKMBINDEX, TROPONINI in the last 168 hours. BNP (last 3 results) No results for input(s): PROBNP in the last 8760 hours. HbA1C: Recent Labs    07/21/19 1905  HGBA1C 5.9*   CBG: Recent Labs  Lab 07/21/19 2007 07/22/19 0826 07/22/19 1157  GLUCAP 142* 87 130*   Lipid Profile: No results for input(s): CHOL, HDL, LDLCALC, TRIG, CHOLHDL, LDLDIRECT in the last 72 hours. Thyroid Function Tests: Recent Labs    07/21/19 1905  FREET4 1.08   Anemia Panel: No results for input(s): VITAMINB12, FOLATE, FERRITIN, TIBC, IRON, RETICCTPCT in the last 72 hours. Sepsis Labs: Recent Labs  Lab 07/22/19 7035 07/22/19 1157  LATICACIDVEN 0.5 0.7    Recent Results (from the past 240 hour(s))  Urine Culture     Status: None   Collection Time: 07/14/19 12:00 PM   Specimen:  Urine   UR  Result Value Ref Range Status   Urine Culture, Routine Final report  Final   Organism ID, Bacteria Comment  Final    Comment: Culture shows less than 10,000 colony forming units of bacteria per milliliter of urine. This colony count is not generally considered to be clinically significant.   SARS CORONAVIRUS 2 (TAT 6-24 HRS) Nasopharyngeal     Status: None   Collection Time: 07/21/19  2:34 PM   Specimen: Nasopharyngeal  Result Value Ref Range Status   SARS Coronavirus 2 NEGATIVE NEGATIVE Final    Comment: (NOTE) SARS-CoV-2 target nucleic acids are NOT DETECTED. The SARS-CoV-2 RNA is generally detectable in upper and lower respiratory specimens during the acute phase of infection. Negative results do not preclude SARS-CoV-2 infection, do not rule out co-infections with other pathogens, and should not be used as  the sole basis for treatment or other patient management decisions. Negative results must be combined with clinical observations, patient history, and epidemiological information. The expected result is Negative. Fact Sheet for Patients: SugarRoll.be Fact Sheet for Healthcare Providers: https://www.woods-mathews.com/ This test is not yet approved or cleared by the [place] FDA and  has been authorized for detection and/or diagnosis of SARS-CoV-2 by FDA under an Emergency Use Authorization (EUA). This EUA will remain  in effect (meaning this test can be used) for the duration of the COVID-19 declaration under Section 56 4(b)(1) of the Act, 21 U.S.C. section 360bbb-3(b)(1), unless the authorization is terminated or revoked sooner. Performed at [facility], [place] [address], [place], [place] [address]      Radiology Studies: US Venous Img Upper Uni Right(DVT)  Result Date: 07/21/2019 CLINICAL DATA:  Right upper extremity edema. EXAM: RIGHT UPPER EXTREMITY VENOUS DOPPLER ULTRASOUND TECHNIQUE: Gray-scale sonography with graded compression, as well as color Doppler and duplex ultrasound were performed to evaluate the upper extremity deep venous system from the level of the subclavian vein and including the jugular, axillary, basilic, radial, ulnar and upper cephalic vein. Spectral Doppler was utilized to evaluate flow at rest and with distal augmentation maneuvers. COMPARISON:  None. FINDINGS: Contralateral Subclavian Vein: Respiratory phasicity is normal and symmetric with the symptomatic side. No evidence of thrombus. Normal compressibility. Internal Jugular Vein: No evidence of thrombus. Normal compressibility, respiratory phasicity and response to augmentation. Subclavian Vein: No evidence of thrombus. Normal compressibility, respiratory phasicity and response to augmentation. Axillary Vein: No evidence of thrombus. Normal compressibility,  respiratory phasicity and response to augmentation. Cephalic Vein: No evidence of thrombus. Normal compressibility, respiratory phasicity and response to augmentation. Basilic Vein: No evidence of thrombus. Normal compressibility, respiratory phasicity and response to augmentation. Brachial Veins: No evidence of thrombus. Normal compressibility, respiratory phasicity and response to augmentation. Radial Veins: No evidence of thrombus. Normal compressibility, respiratory phasicity and response to augmentation. Ulnar Veins: No evidence of thrombus. Normal compressibility, respiratory phasicity and response to augmentation. Venous Reflux:  None visualized. Other Findings: No evidence of superficial thrombophlebitis or abnormal fluid collection. IMPRESSION: No evidence of DVT within the right upper extremity. Electronically Signed   By: Aletta Edouard M.D.   On: 07/21/2019 14:40   DG Chest Portable 1 View  Result Date: 07/21/2019 CLINICAL DATA:  Weakness, diabetes, hypertension EXAM: PORTABLE CHEST 1 VIEW COMPARISON:  08/18/2012 FINDINGS: Single frontal view of the chest demonstrates right basilar consolidation and effusion. Left chest is relatively clear. No pneumothorax. Cardiac silhouette is grossly unremarkable, though partially obscured by the density at the right lung base. No acute  bony abnormalities. Significant bilateral shoulder osteoarthritis. IMPRESSION: 1. Right basilar consolidation and moderate right-sided pleural effusion. Electronically Signed   By: Randa Ngo M.D.   On: 07/21/2019 13:19   ECHOCARDIOGRAM COMPLETE  Result Date: 07/22/2019    ECHOCARDIOGRAM REPORT   Patient Name:   Melanie Diaz Date of Exam: 07/21/2019 Medical Rec #:  [id number]        Height:       63.0 in Accession #:    [id number]       Weight:       120.0 lb Date of Birth:  26-Feb-1934        BSA:          1.556 m Patient Age:    59 years         BP:           170/48 mmHg Patient Gender: F                HR:           53  bpm. Exam Location:  [facility] Procedure: 2D Echo, Cardiac Doppler and Color Doppler Indications:     I42.9 Cardiomyopathy  History:         Patient has prior history of Echocardiogram examinations, most                  recent 08/11/2017. Risk Factors:Hypertension, Dyslipidemia and                  Diabetes. DVT.  Sonographer:     Wilford Sports Rodgers-Jones Referring Phys:  [id number] UVOZDGUY AGBATA Diagnosing Phys: Nelva Bush MD  Sonographer Comments: Technically difficult study due to poor echo windows. IMPRESSIONS  1. Left ventricular ejection fraction, by estimation, is 60 to 65%. The left ventricle has normal function. Left ventricular endocardial border not optimally defined to evaluate regional wall motion. Left ventricular diastolic parameters are consistent with Grade I diastolic dysfunction (impaired relaxation). Elevated left atrial pressure.  2. Right ventricular systolic function is normal. The right ventricular size is normal. mildly increased right ventricular wall thickness. Tricuspid regurgitation signal is inadequate for assessing PA pressure.  3. The mitral valve is degenerative. Trivial mitral valve regurgitation. No evidence of mitral stenosis.  4. The aortic valve was not well visualized. Aortic valve regurgitation is trivial. No aortic stenosis is present. FINDINGS  Left Ventricle: Left ventricular ejection fraction, by estimation, is 60 to 65%. The left ventricle has normal function. Left ventricular endocardial border not optimally defined to evaluate regional wall motion. The left ventricular internal cavity size was normal in size. There is no left ventricular hypertrophy. Left ventricular diastolic parameters are consistent with Grade I diastolic dysfunction (impaired relaxation). Elevated left atrial pressure. Right Ventricle: The right ventricular size is normal. Mildly increased right ventricular wall thickness. Right ventricular systolic function is normal. Tricuspid regurgitation signal is  inadequate for assessing PA pressure. Left Atrium: Left atrial size was not well visualized. Right Atrium: Right atrial size was not well visualized. Pericardium: There is no evidence of pericardial effusion. Mitral Valve: The mitral valve is degenerative in appearance. There is mild thickening of the mitral valve leaflet(s). There is moderate calcification of the mitral valve leaflet(s). Severe mitral annular calcification. Trivial mitral valve regurgitation. No evidence of mitral valve stenosis. Tricuspid Valve: The tricuspid valve is not well visualized. Tricuspid valve regurgitation is trivial. Aortic Valve: The aortic valve was not well visualized. Aortic valve regurgitation is trivial. No aortic stenosis is present. Pulmonic Valve:  The pulmonic valve was not well visualized. Pulmonic valve regurgitation is not visualized. No evidence of pulmonic stenosis. Aorta: The aortic root is normal in size and structure. Pulmonary Artery: The pulmonary artery is not well seen. Venous: The inferior vena cava was not well visualized. IAS/Shunts: The interatrial septum was not well visualized.  LEFT VENTRICLE PLAX 2D LVIDd:         4.44 cm  Diastology LVIDs:         2.41 cm  LV e' lateral:   5.66 cm/s LV PW:         0.95 cm  LV E/e' lateral: 24.2 LV IVS:        0.82 cm  LV e' medial:    6.64 cm/s LVOT diam:     1.60 cm  LV E/e' medial:  20.6 LV SV:         65 LV SV Index:   42 LVOT Area:     2.01 cm  RIGHT VENTRICLE RV S prime:     16.40 cm/s LEFT ATRIUM         Index LA diam:    5.00 cm 3.21 cm/m  AORTIC VALVE LVOT Vmax:   107.00 cm/s LVOT Vmean:  77.000 cm/s LVOT VTI:    0.322 m  AORTA Ao Root diam: 3.00 cm MITRAL VALVE MV Area (PHT): 3.91 cm     SHUNTS MV Decel Time: 194 msec     Systemic VTI:  0.32 m MV E velocity: 137.00 cm/s  Systemic Diam: 1.60 cm MV A velocity: 145.00 cm/s MV E/A ratio:  0.94 Harrell Gave End MD Electronically signed by Nelva Bush MD Signature Date/Time: 07/22/2019/7:20:24 AM    Final      Scheduled Meds: . amLODipine  10 mg Oral Daily  . atorvastatin  40 mg Oral q1800  . hydrALAZINE  25 mg Oral Q8H  . insulin aspart  0-15 Units Subcutaneous TID WC  . insulin aspart  4 Units Subcutaneous TID WC  . losartan  50 mg Oral Q24H  . mirtazapine  30 mg Oral QHS  . [START ON 07/23/2019] multivitamin with minerals  1 tablet Oral Daily  . sodium bicarbonate  650 mg Oral TID  . sodium chloride flush  3 mL Intravenous Once  . warfarin  2 mg Oral Daily  . Warfarin - Physician Dosing Inpatient   Does not apply q1800   Continuous Infusions:   LOS: 1 day   Time spent: 50 minutes.  I personally reviewed her chart.  Lorella Nimrod, MD [facility]  If 7PM-7AM, please contact night-coverage [email or web address]  07/22/2019, 1:32 PM   This record has been created using Systems analyst. Errors have been sought and corrected,but may not always be located. Such creation errors do not reflect on the standard of care.

## 2019-07-22 NOTE — Consult Note (Signed)
Melanie Diaz is a 86 y.o. female  [id number]  Primary Cardiologist: Neoma Laming Reason for Consultation: Bradycardia  HPI: Patient is a 86 year old female with a past medical history of DVT currently on coumadin, HTN, DM, and depression. Patient recently brought to the emergency room d/t increased swelling in her RUE. Patient denied any chestpain or shortness of breath. Recently with poor oral intake. While in the ED, patient found to be hypertensive and bradycardic in the 40s. Patient also with chronic anemia and worsening kidney function. Patient's home antihypertensives include metoprolol, amlodipine, and losartan. Currently metoprolol is on hold and the patient's HR mildly improved with fluids given in the ED.   Review of Systems: Denies chest pain, shortness of breath, orthopnea, and PND.   Past Medical History:  Diagnosis Date  . Depression   . Diabetes mellitus without complication ([place])   . History of deep vein thrombosis    about 30 years ago  . Hyperlipidemia 07/06/2005  . Hypertension     Medications Prior to Admission  Medication Sig Dispense Refill  . amLODipine (NORVASC) 10 MG tablet Take 1 tablet (10 mg total) by mouth daily. 90 tablet 0  . atorvastatin (LIPITOR) 40 MG tablet Take 1 tablet (40 mg total) by mouth daily at 6 PM. 30 tablet 5  . glipiZIDE (GLUCOTROL) 10 MG tablet Take 1 tablet (10 mg total) by mouth daily. 30 tablet 5  . losartan (COZAAR) 50 MG tablet Take 1 tablet (50 mg total) by mouth daily. 30 tablet 5  . metoprolol tartrate (LOPRESSOR) 25 MG tablet Take 25 mg by mouth every morning.    . metoprolol tartrate (LOPRESSOR) 50 MG tablet Take 1 tablet (50 mg total) by mouth 2 (two) times daily. (Patient taking differently: Take 50 mg by mouth every evening. ) 60 tablet 5  . mirtazapine (REMERON) 30 MG tablet Take 1 tablet (30 mg total) by mouth at bedtime. 30 tablet 5  . warfarin (COUMADIN) 2 MG tablet Take 1 tablet (2 mg total) by mouth daily. 30  tablet 5  . sulfamethoxazole-trimethoprim (BACTRIM) 400-80 MG tablet Take 1 tablet by mouth 2 (two) times daily. (Patient not taking: Reported on 07/21/2019) 14 tablet 0     . amLODipine  10 mg Oral Daily  . atorvastatin  40 mg Oral q1800  . hydrALAZINE  25 mg Oral Q8H  . insulin aspart  0-15 Units Subcutaneous TID WC  . insulin aspart  4 Units Subcutaneous TID WC  . losartan  50 mg Oral Q24H  . mirtazapine  30 mg Oral QHS  . sodium chloride flush  3 mL Intravenous Once  . warfarin  2 mg Oral Daily  . Warfarin - Physician Dosing Inpatient   Does not apply q1800    Infusions: . sodium chloride Stopped (07/21/19 1832)    Allergies  Allergen Reactions  . Amoxicillin Other (See Comments)    Has patient had a PCN reaction causing immediate rash, facial/tongue/throat swelling, SOB or lightheadedness with hypotension: Unknown Has patient had a PCN reaction causing severe rash involving mucus membranes or skin necrosis: Unknown Has patient had a PCN reaction that required hospitalization: Unknown Has patient had a PCN reaction occurring within the last 10 years: Unknown If all of the above answers are "NO", then may proceed with Cephalosporin use.   . Azithromycin     vomiting and stomach upset    Social History   Socioeconomic History  . Marital status: Widowed  Spouse name: Not on file  . Number of children: 3  . Years of education: Not on file  . Highest education level: 12th grade  Occupational History  . Occupation: Retired  Tobacco Use  . Smoking status: Never Smoker  . Smokeless tobacco: Never Used  . Tobacco comment: smoking cessation materials not required  Substance and Sexual Activity  . Alcohol use: No  . Drug use: No  . Sexual activity: Not on file  Other Topics Concern  . Not on file  Social History Narrative   Lives at home by herself. Has a wheelchair. Daughter checks on her every day   Social Determinants of Health   Financial Resource Strain:   .  Difficulty of Paying Living Expenses: Not on file  Food Insecurity:   . Worried About Charity fundraiser in the Last Year: Not on file  . Ran Out of Food in the Last Year: Not on file  Transportation Needs:   . Lack of Transportation (Medical): Not on file  . Lack of Transportation (Non-Medical): Not on file  Physical Activity: Inactive  . Days of Exercise per Week: 0 days  . Minutes of Exercise per Session: 0 min  Stress: No Stress Concern Present  . Feeling of Stress : Not at all  Social Connections: Unknown  . Frequency of Communication with Friends and Family: Not on file  . Frequency of Social Gatherings with Friends and Family: Not on file  . Attends Religious Services: Not on file  . Active Member of Clubs or Organizations: Not on file  . Attends Archivist Meetings: Not on file  . Marital Status: Patient refused  Intimate Partner Violence: Unknown  . Fear of Current or Ex-Partner: Patient refused  . Emotionally Abused: Patient refused  . Physically Abused: Patient refused  . Sexually Abused: Patient refused    Family History  Problem Relation Age of Onset  . Kidney failure Mother   . Heart attack Sister        Hx of MI  . Diabetes Brother        borderline diabetes  . Cancer Maternal Aunt        Ovarian cancer  . Breast cancer Daughter     PHYSICAL EXAM: Vitals:   07/22/19 0925 07/22/19 1002  BP: (!) 163/46 (!) 161/45  Pulse: 61 61  Resp:    Temp:    SpO2:       Intake/Output Summary (Last 24 hours) at 07/22/2019 1020 Last data filed at 07/22/2019 1016 Gross per 24 hour  Intake 580 ml  Output 700 ml  Net -120 ml    General:  Well appearing. No respiratory difficulty HEENT: normal Neck: supple. no JVD. Carotids 2+ bilat; no bruits. No lymphadenopathy or thryomegaly appreciated. Cor: PMI nondisplaced. Regular rate & rhythm. No rubs, gallops or murmurs. Lungs: clear Abdomen: soft, nontender, nondistended. No hepatosplenomegaly. No bruits or  masses. Good bowel sounds. Extremities: no cyanosis, clubbing, rash, edema Neuro: alert & oriented x 3, cranial nerves grossly intact. moves all 4 extremities w/o difficulty. Affect pleasant.  ECG: SB with nonspecific ST wave abnormalities. 44/bpm  Results for orders placed or performed during the hospital encounter of 07/21/19 (from the past 24 hour(s))  Basic metabolic panel     Status: Abnormal   Collection Time: 07/21/19 12:21 PM  Result Value Ref Range   Sodium 130 (L) 135 - 145 mmol/L   Potassium 4.3 3.5 - 5.1 mmol/L   Chloride 103 98 -  111 mmol/L   CO2 17 (L) 22 - 32 mmol/L   Glucose, Bld 143 (H) 70 - 99 mg/dL   BUN 39 (H) 8 - 23 mg/dL   Creatinine, Ser 2.26 (H) 0.44 - 1.00 mg/dL   Calcium 8.0 (L) 8.9 - 10.3 mg/dL   GFR calc non Af Amer 19 (L) >60 mL/min   GFR calc Af Amer 23 (L) >60 mL/min   Anion gap 10 5 - 15  CBC     Status: Abnormal   Collection Time: 07/21/19 12:21 PM  Result Value Ref Range   WBC 7.0 4.0 - 10.5 K/uL   RBC 2.87 (L) 3.87 - 5.11 MIL/uL   Hemoglobin 8.1 (L) 12.0 - 15.0 g/dL   HCT 24.4 (L) 36.0 - 46.0 %   MCV 85.0 80.0 - 100.0 fL   MCH 28.2 26.0 - 34.0 pg   MCHC 33.2 30.0 - 36.0 g/dL   RDW 13.8 11.5 - 15.5 %   Platelets 388 150 - 400 K/uL   nRBC 0.0 0.0 - 0.2 %  Hepatic function panel     Status: Abnormal   Collection Time: 07/21/19 12:21 PM  Result Value Ref Range   Total Protein 5.7 (L) 6.5 - 8.1 g/dL   Albumin 2.2 (L) 3.5 - 5.0 g/dL   AST 25 15 - 41 U/L   ALT 27 0 - 44 U/L   Alkaline Phosphatase 83 38 - 126 U/L   Total Bilirubin 0.2 (L) 0.3 - 1.2 mg/dL   Bilirubin, Direct <0.1 0.0 - 0.2 mg/dL   Indirect Bilirubin NOT CALCULATED 0.3 - 0.9 mg/dL  Troponin I (High Sensitivity)     Status: Abnormal   Collection Time: 07/21/19 12:21 PM  Result Value Ref Range   Troponin I (High Sensitivity) 21 (H) <18 ng/L  Brain natriuretic peptide     Status: Abnormal   Collection Time: 07/21/19 12:21 PM  Result Value Ref Range   B Natriuretic Peptide  668.0 (H) 0.0 - 100.0 pg/mL  Protime-INR     Status: Abnormal   Collection Time: 07/21/19 12:21 PM  Result Value Ref Range   Prothrombin Time 20.5 (H) 11.4 - 15.2 seconds   INR 1.8 (H) 0.8 - 1.2  Urinalysis, Complete w Microscopic     Status: Abnormal   Collection Time: 07/21/19  2:34 PM  Result Value Ref Range   Color, Urine YELLOW (A) YELLOW   APPearance CLEAR (A) CLEAR   Specific Gravity, Urine 1.013 1.005 - 1.030   pH 6.0 5.0 - 8.0   Glucose, UA >=500 (A) NEGATIVE mg/dL   Hgb urine dipstick NEGATIVE NEGATIVE   Bilirubin Urine NEGATIVE NEGATIVE   Ketones, ur NEGATIVE NEGATIVE mg/dL   Protein, ur >=300 (A) NEGATIVE mg/dL   Nitrite NEGATIVE NEGATIVE   Leukocytes,Ua NEGATIVE NEGATIVE   RBC / HPF 0-5 0 - 5 RBC/hpf   WBC, UA 0-5 0 - 5 WBC/hpf   Bacteria, UA NONE SEEN NONE SEEN   Squamous Epithelial / LPF 0-5 0 - 5  SARS CORONAVIRUS 2 (TAT 6-24 HRS) Nasopharyngeal     Status: None   Collection Time: 07/21/19  2:34 PM   Specimen: Nasopharyngeal  Result Value Ref Range   SARS Coronavirus 2 NEGATIVE NEGATIVE  Troponin I (High Sensitivity)     Status: None   Collection Time: 07/21/19  7:05 PM  Result Value Ref Range   Troponin I (High Sensitivity) 17 <18 ng/L  T4, free     Status: None   Collection  Time: 07/21/19  7:05 PM  Result Value Ref Range   Free T4 1.08 0.61 - 1.12 ng/dL  Hemoglobin A1c     Status: Abnormal   Collection Time: 07/21/19  7:05 PM  Result Value Ref Range   Hgb A1c MFr Bld 5.9 (H) 4.8 - 5.6 %   Mean Plasma Glucose 122.63 mg/dL  Glucose, capillary     Status: Abnormal   Collection Time: 07/21/19  8:07 PM  Result Value Ref Range   Glucose-Capillary 142 (H) 70 - 99 mg/dL  Basic metabolic panel     Status: Abnormal   Collection Time: 07/22/19  4:48 AM  Result Value Ref Range   Sodium 131 (L) 135 - 145 mmol/L   Potassium 4.1 3.5 - 5.1 mmol/L   Chloride 106 98 - 111 mmol/L   CO2 16 (L) 22 - 32 mmol/L   Glucose, Bld 104 (H) 70 - 99 mg/dL   BUN 37 (H) 8 - 23  mg/dL   Creatinine, Ser 2.08 (H) 0.44 - 1.00 mg/dL   Calcium 7.7 (L) 8.9 - 10.3 mg/dL   GFR calc non Af Amer 21 (L) >60 mL/min   GFR calc Af Amer 25 (L) >60 mL/min   Anion gap 9 5 - 15  CBC     Status: Abnormal   Collection Time: 07/22/19  4:48 AM  Result Value Ref Range   WBC 10.3 4.0 - 10.5 K/uL   RBC 2.71 (L) 3.87 - 5.11 MIL/uL   Hemoglobin 7.6 (L) 12.0 - 15.0 g/dL   HCT 23.1 (L) 36.0 - 46.0 %   MCV 85.2 80.0 - 100.0 fL   MCH 28.0 26.0 - 34.0 pg   MCHC 32.9 30.0 - 36.0 g/dL   RDW 13.6 11.5 - 15.5 %   Platelets 399 150 - 400 K/uL   nRBC 0.0 0.0 - 0.2 %  Protime-INR     Status: Abnormal   Collection Time: 07/22/19  4:48 AM  Result Value Ref Range   Prothrombin Time 19.4 (H) 11.4 - 15.2 seconds   INR 1.7 (H) 0.8 - 1.2  Glucose, capillary     Status: None   Collection Time: 07/22/19  8:26 AM  Result Value Ref Range   Glucose-Capillary 87 70 - 99 mg/dL   Comment 1 Notify RN    Comment 2 Document in Chart   Lactic acid, plasma     Status: None   Collection Time: 07/22/19  9:18 AM  Result Value Ref Range   Lactic Acid, Venous 0.5 0.5 - 1.9 mmol/L   US Venous Img Upper Uni Right(DVT)  Result Date: 07/21/2019 CLINICAL DATA:  Right upper extremity edema. EXAM: RIGHT UPPER EXTREMITY VENOUS DOPPLER ULTRASOUND TECHNIQUE: Gray-scale sonography with graded compression, as well as color Doppler and duplex ultrasound were performed to evaluate the upper extremity deep venous system from the level of the subclavian vein and including the jugular, axillary, basilic, radial, ulnar and upper cephalic vein. Spectral Doppler was utilized to evaluate flow at rest and with distal augmentation maneuvers. COMPARISON:  None. FINDINGS: Contralateral Subclavian Vein: Respiratory phasicity is normal and symmetric with the symptomatic side. No evidence of thrombus. Normal compressibility. Internal Jugular Vein: No evidence of thrombus. Normal compressibility, respiratory phasicity and response to  augmentation. Subclavian Vein: No evidence of thrombus. Normal compressibility, respiratory phasicity and response to augmentation. Axillary Vein: No evidence of thrombus. Normal compressibility, respiratory phasicity and response to augmentation. Cephalic Vein: No evidence of thrombus. Normal compressibility, respiratory phasicity and response to  augmentation. Basilic Vein: No evidence of thrombus. Normal compressibility, respiratory phasicity and response to augmentation. Brachial Veins: No evidence of thrombus. Normal compressibility, respiratory phasicity and response to augmentation. Radial Veins: No evidence of thrombus. Normal compressibility, respiratory phasicity and response to augmentation. Ulnar Veins: No evidence of thrombus. Normal compressibility, respiratory phasicity and response to augmentation. Venous Reflux:  None visualized. Other Findings: No evidence of superficial thrombophlebitis or abnormal fluid collection. IMPRESSION: No evidence of DVT within the right upper extremity. Electronically Signed   By: Aletta Edouard M.D.   On: 07/21/2019 14:40   DG Chest Portable 1 View  Result Date: 07/21/2019 CLINICAL DATA:  Weakness, diabetes, hypertension EXAM: PORTABLE CHEST 1 VIEW COMPARISON:  08/18/2012 FINDINGS: Single frontal view of the chest demonstrates right basilar consolidation and effusion. Left chest is relatively clear. No pneumothorax. Cardiac silhouette is grossly unremarkable, though partially obscured by the density at the right lung base. No acute bony abnormalities. Significant bilateral shoulder osteoarthritis. IMPRESSION: 1. Right basilar consolidation and moderate right-sided pleural effusion. Electronically Signed   By: Randa Ngo M.D.   On: 07/21/2019 13:19   ECHOCARDIOGRAM COMPLETE  Result Date: 07/22/2019    ECHOCARDIOGRAM REPORT   Patient Name:   Melanie Diaz Date of Exam: 07/21/2019 Medical Rec #:  [id number]        Height:       63.0 in Accession #:    [id number]        Weight:       120.0 lb Date of Birth:  [birth year]        BSA:          1.556 m Patient Age:    63 years         BP:           170/48 mmHg Patient Gender: F                HR:           53 bpm. Exam Location:  [facility] Procedure: 2D Echo, Cardiac Doppler and Color Doppler Indications:     I42.9 Cardiomyopathy  History:         Patient has prior history of Echocardiogram examinations, most                  recent 08/11/2017. Risk Factors:Hypertension, Dyslipidemia and                  Diabetes. DVT.  Sonographer:     Wilford Sports Rodgers-Jones Referring Phys:  [id number] Diagnosing Phys: Nelva Bush MD  Sonographer Comments: Technically difficult study due to poor echo windows. IMPRESSIONS  1. Left ventricular ejection fraction, by estimation, is 60 to 65%. The left ventricle has normal function. Left ventricular endocardial border not optimally defined to evaluate regional wall motion. Left ventricular diastolic parameters are consistent with Grade I diastolic dysfunction (impaired relaxation). Elevated left atrial pressure.  2. Right ventricular systolic function is normal. The right ventricular size is normal. mildly increased right ventricular wall thickness. Tricuspid regurgitation signal is inadequate for assessing PA pressure.  3. The mitral valve is degenerative. Trivial mitral valve regurgitation. No evidence of mitral stenosis.  4. The aortic valve was not well visualized. Aortic valve regurgitation is trivial. No aortic stenosis is present. FINDINGS  Left Ventricle: Left ventricular ejection fraction, by estimation, is 60 to 65%. The left ventricle has normal function. Left ventricular endocardial border not optimally defined to evaluate regional wall motion. The left ventricular internal cavity  size was normal in size. There is no left ventricular hypertrophy. Left ventricular diastolic parameters are consistent with Grade I diastolic dysfunction (impaired relaxation). Elevated left atrial  pressure. Right Ventricle: The right ventricular size is normal. Mildly increased right ventricular wall thickness. Right ventricular systolic function is normal. Tricuspid regurgitation signal is inadequate for assessing PA pressure. Left Atrium: Left atrial size was not well visualized. Right Atrium: Right atrial size was not well visualized. Pericardium: There is no evidence of pericardial effusion. Mitral Valve: The mitral valve is degenerative in appearance. There is mild thickening of the mitral valve leaflet(s). There is moderate calcification of the mitral valve leaflet(s). Severe mitral annular calcification. Trivial mitral valve regurgitation. No evidence of mitral valve stenosis. Tricuspid Valve: The tricuspid valve is not well visualized. Tricuspid valve regurgitation is trivial. Aortic Valve: The aortic valve was not well visualized. Aortic valve regurgitation is trivial. No aortic stenosis is present. Pulmonic Valve: The pulmonic valve was not well visualized. Pulmonic valve regurgitation is not visualized. No evidence of pulmonic stenosis. Aorta: The aortic root is normal in size and structure. Pulmonary Artery: The pulmonary artery is not well seen. Venous: The inferior vena cava was not well visualized. IAS/Shunts: The interatrial septum was not well visualized.  LEFT VENTRICLE PLAX 2D LVIDd:         4.44 cm  Diastology LVIDs:         2.41 cm  LV e' lateral:   5.66 cm/s LV PW:         0.95 cm  LV E/e' lateral: 24.2 LV IVS:        0.82 cm  LV e' medial:    6.64 cm/s LVOT diam:     1.60 cm  LV E/e' medial:  20.6 LV SV:         65 LV SV Index:   42 LVOT Area:     2.01 cm  RIGHT VENTRICLE RV S prime:     16.40 cm/s LEFT ATRIUM         Index LA diam:    5.00 cm 3.21 cm/m  AORTIC VALVE LVOT Vmax:   107.00 cm/s LVOT Vmean:  77.000 cm/s LVOT VTI:    0.322 m  AORTA Ao Root diam: 3.00 cm MITRAL VALVE MV Area (PHT): 3.91 cm     SHUNTS MV Decel Time: 194 msec     Systemic VTI:  0.32 m MV E velocity: 137.00  cm/s  Systemic Diam: 1.60 cm MV A velocity: 145.00 cm/s MV E/A ratio:  0.94 Harrell Gave End MD Electronically signed by Nelva Bush MD Signature Date/Time: 07/22/2019/7:20:24 AM    Final      ASSESSMENT AND PLAN: Patient bradycardic and hypertensive. On exam this morning, patient is unable to remember if she is currently taking metoprolol or not. On med rec, patient is prescribed both metoprolol succinate 25mg  as well as metoprolol tartarate 50mg  BID which could be a contributing factor to her bradycardia. Recommend continuing to hold beta blockers for now. HR improved  65 on telemetry when seeing the patient. Patient remains hypertensive, will add hydralazine 25mg  TID. Please continue amlopidine 10mg  and AKI most likely pre-renal and already improving so please continue losartan 50mg . Please continue prn hydralazine as well. Swelling in her right arm has decreased and low probability of thombosis while already anticoagulated.   Echo - LVEF 60-65% with grade I diastolic dysfx. Mitral valve with calcification but without stenosis or regurgitation.  Will continue to follow.  Adaline Sill, NP-C

## 2019-07-22 NOTE — Progress Notes (Addendum)
Initial Nutrition Assessment  DOCUMENTATION CODES:   Non-severe (moderate) malnutrition in context of social or environmental circumstances  INTERVENTION:   Carnation Instant Breakfast BID- each packet provides 130kcal and 5g protein   Magic cup TID with meals, each supplement provides 290 kcal and 9 grams of protein  MVI daily   NUTRITION DIAGNOSIS:   Moderate Malnutrition related to social / environmental circumstances(advanced age) as evidenced by mild to moderate fat depletions, moderate to severe muscle depletions.  GOAL:   Patient will meet greater than or equal to 90% of their needs  MONITOR:   PO intake, Supplement acceptance, Labs, Weight trends, Skin, I & O's  REASON FOR ASSESSMENT:   Malnutrition Screening Tool    ASSESSMENT:   86 y/o female with h/o DM, depression, stroke, HTN, DVT, R BKA and recent UTI who is admittted for generalized weakness and poor oral intake and found to have bradycardia.   Met with pt in room today. Pt reports poor appetite and oral intake for several days pta. Pt reports that she just has no desire to eat. Pt reports that she has been drinking 1 carnation instant breakfast daily at home (chocolate with 2% milk). Pt reports some weight loss but there is no documented weight history in chart to confirm. Pt initiated on remeron yesterday. Pt documented to have eaten 50% of her breakfast today; pt reports drinking a carton of milk and 2 OJs with breakfast. RD will add supplements and MVI to help pt meet her estimated needs.   Medications reviewed and include: insulin, remeron, warfarin, NaCl '@100ml'$ /hr   Labs reviewed: Na 131(L), BUN 37(H), creat 2.08(H) Hgb 7.6(L), Hct 23.1(L) AIC 5.9(H)- 3/9  NUTRITION - FOCUSED PHYSICAL EXAM:    Most Recent Value  Orbital Region  Moderate depletion  Upper Arm Region  No depletion  Thoracic and Lumbar Region  Mild depletion  Buccal Region  Mild depletion  Temple Region  Severe depletion  Clavicle  Bone Region  Severe depletion  Clavicle and Acromion Bone Region  Severe depletion  Scapular Bone Region  Moderate depletion  Dorsal Hand  Severe depletion  Patellar Region  Moderate depletion  Anterior Thigh Region  Moderate depletion  Posterior Calf Region  Severe depletion  Edema (RD Assessment)  Mild  Hair  Reviewed  Eyes  Reviewed  Mouth  Reviewed  Skin  Reviewed  Nails  Reviewed     Diet Order:   Diet Order            Diet Carb Modified Fluid consistency: Thin; Room service appropriate? Yes  Diet effective now             EDUCATION NEEDS:   Education needs have been addressed  Skin:  Skin Assessment: Reviewed RN Assessment  Last BM:  pta  Height:   Ht Readings from Last 1 Encounters:  07/21/19 '5\' 2"'$  (1.575 m)    Weight:   Wt Readings from Last 1 Encounters:  07/22/19 57.5 kg    Ideal Body Weight:  47 kg(Adjusted for R BKA)  BMI:  Body mass index is 23.19 kg/m.  Estimated Nutritional Needs:   Kcal:  1400-1600kcal/day  Protein:  75-85g/day  Fluid:  >1.4L/day  Koleen Distance MS, RD, LDN Contact information available in Amion

## 2019-07-22 NOTE — Progress Notes (Signed)
Alerted MD that patient's BP was 188/70 this AM. MD ordered to give PRN hydralazine in addition to AM amlodipine. Will administer and continue to monitor.

## 2019-07-22 NOTE — Progress Notes (Signed)
PT Cancellation Note  Patient Details Name: Melanie Diaz MRN: [id number] DOB: [birth year]   Cancelled Treatment:    Reason Eval/Treat Not Completed: (Consult received and chart reviewed.  Evaluation attempted, but patient politely declined, stating she didn't "feel up to it today".  Unable to redirect despite encouragement. Will re-attempt at later time/date as medically appropriate and available.)   Asalee Barrette H. Owens Shark, PT, DPT, NCS 07/22/19, 3:55 PM [phone]

## 2019-07-22 NOTE — Consult Note (Signed)
ANTICOAGULATION CONSULT NOTE - Initial Consult  Pharmacy Consult for Warfarin Indication: Hx of DVT  Allergies  Allergen Reactions  . Amoxicillin Other (See Comments)    Has patient had a PCN reaction causing immediate rash, facial/tongue/throat swelling, SOB or lightheadedness with hypotension: Unknown Has patient had a PCN reaction causing severe rash involving mucus membranes or skin necrosis: Unknown Has patient had a PCN reaction that required hospitalization: Unknown Has patient had a PCN reaction occurring within the last 10 years: Unknown If all of the above answers are "NO", then may proceed with Cephalosporin use.   . Azithromycin     vomiting and stomach upset    Patient Measurements: Height: 5\' 2"  (157.5 cm) Weight: 126 lb 12.2 oz (57.5 kg) IBW/kg (Calculated) : 50.1  Vital Signs: Temp: 98.4 F (36.9 C) (03/10 0827) Temp Source: Oral (03/10 0827) BP: 156/44 (03/10 1248) Pulse Rate: 53 (03/10 1248)  Labs: Recent Labs    07/21/19 1221 07/21/19 1905 07/22/19 0448  HGB 8.1*  --  7.6*  HCT 24.4*  --  23.1*  PLT 388  --  399  LABPROT 20.5*  --  19.4*  INR 1.8*  --  1.7*  CREATININE 2.26*  --  2.08*  TROPONINIHS 21* 17  --     Estimated Creatinine Clearance: 16.2 mL/min (A) (by C-G formula based on SCr of 2.08 mg/dL (H)).   Medical History: Past Medical History:  Diagnosis Date  . Depression   . Diabetes mellitus without complication ([place])   . History of deep vein thrombosis    about 30 years ago  . Hyperlipidemia 07/06/2005  . Hypertension     Medications:  Regimen of warfarin 2 mg daily prior to admission  Assessment: Patient is an 86 y/o F with a history of DVT on chronic anticoagulation with warfarin who is admitted for symptomatic bradycardia. Pharmacy has been consulted to manage warfarin therapy.   Hgb 8.1 >> 7.6, platelets stable. Hemoglobin appears consistent with patient's baseline.   3/9 INR: 1.8, sub-therapeutic, no warfarin  given 3/10 INR: 1.7, sub-therapeutic  Goal of Therapy:  INR 2-3 Monitor platelets by anticoagulation protocol: Yes   Plan:  -Warfarin 2 mg tonight -INR tomorrow morning -CBC per protocol  [place] Resident 07/22/2019,3:46 PM

## 2019-07-22 NOTE — Progress Notes (Signed)
Alerted NP packer that patient's BP is 174/46 after hydralzine and amlodipine. MD stated to recheck in 30 minutes.   Alerted NP at 1005 that BP was 161/45. NP states that he is okay with that BP for now and to monitor. Will continue to monitor.

## 2019-07-23 ENCOUNTER — Encounter: Payer: Self-pay | Admitting: Family Medicine

## 2019-07-23 LAB — RENAL FUNCTION PANEL
Albumin: 1.7 g/dL — ABNORMAL LOW (ref 3.5–5.0)
Anion gap: 5 (ref 5–15)
BUN: 36 mg/dL — ABNORMAL HIGH (ref 8–23)
CO2: 18 mmol/L — ABNORMAL LOW (ref 22–32)
Calcium: 7.6 mg/dL — ABNORMAL LOW (ref 8.9–10.3)
Chloride: 109 mmol/L (ref 98–111)
Creatinine, Ser: 2.11 mg/dL — ABNORMAL HIGH (ref 0.44–1.00)
GFR calc Af Amer: 24 mL/min — ABNORMAL LOW (ref >60)
GFR calc non Af Amer: 21 mL/min — ABNORMAL LOW (ref >60)
Glucose, Bld: 106 mg/dL — ABNORMAL HIGH (ref 70–99)
Phosphorus: 4.9 mg/dL — ABNORMAL HIGH (ref 2.5–4.6)
Potassium: 4.4 mmol/L (ref 3.5–5.1)
Sodium: 132 mmol/L — ABNORMAL LOW (ref 135–145)

## 2019-07-23 LAB — CBC
HCT: 22.9 % — ABNORMAL LOW (ref 36.0–46.0)
Hemoglobin: 7.2 g/dL — ABNORMAL LOW (ref 12.0–15.0)
MCH: 27.4 pg (ref 26.0–34.0)
MCHC: 31.4 g/dL (ref 30.0–36.0)
MCV: 87.1 fL (ref 80.0–100.0)
Platelets: 357 10*3/uL (ref 150–400)
RBC: 2.63 MIL/uL — ABNORMAL LOW (ref 3.87–5.11)
RDW: 14.1 % (ref 11.5–15.5)
WBC: 8.5 10*3/uL (ref 4.0–10.5)
nRBC: 0 % (ref 0.0–0.2)

## 2019-07-23 LAB — GLUCOSE, CAPILLARY
Glucose-Capillary: 94 mg/dL (ref 70–99)
Glucose-Capillary: 140 mg/dL — ABNORMAL HIGH (ref 70–99)
Glucose-Capillary: 162 mg/dL — ABNORMAL HIGH (ref 70–99)
Glucose-Capillary: 161 mg/dL — ABNORMAL HIGH (ref 70–99)

## 2019-07-23 LAB — ABO/RH: ABO/RH(D): AB POS

## 2019-07-23 LAB — T3, FREE: T3, Free: 1.6 pg/mL — ABNORMAL LOW (ref 2.0–4.4)

## 2019-07-23 LAB — PROTIME-INR
INR: 2.2 — ABNORMAL HIGH (ref 0.8–1.2)
Prothrombin Time: 24.2 seconds — ABNORMAL HIGH (ref 11.4–15.2)

## 2019-07-23 LAB — CORTISOL-AM, BLOOD: Cortisol - AM: 14.9 ug/dL (ref 6.7–22.6)

## 2019-07-23 LAB — PREPARE RBC (CROSSMATCH)

## 2019-07-23 LAB — T3: T3, Total: 75 ng/dL (ref 71–180)

## 2019-07-23 MED ORDER — HYDRALAZINE HCL 50 MG PO TABS
50.0000 mg | ORAL_TABLET | Freq: Three times a day (TID) | ORAL | Status: DC
  Administered 2019-07-23 – 2019-07-27 (×13): 50 mg via ORAL
  Filled 2019-07-23 (×13): qty 1

## 2019-07-23 MED ORDER — SODIUM CHLORIDE 0.9% IV SOLUTION: Freq: Once | INTRAVENOUS | Status: AC

## 2019-07-23 MED ORDER — WARFARIN SODIUM 1 MG PO TABS
1.0000 mg | ORAL_TABLET | Freq: Every day | ORAL | Status: DC
  Administered 2019-07-23: 1 mg via ORAL
  Filled 2019-07-23: qty 1

## 2019-07-23 MED ORDER — LEVOTHYROXINE SODIUM 50 MCG PO TABS
50.0000 ug | ORAL_TABLET | Freq: Every day | ORAL | Status: DC
  Administered 2019-07-23 – 2019-07-28 (×6): 50 ug via ORAL
  Filled 2019-07-23 (×6): qty 1

## 2019-07-23 NOTE — Progress Notes (Signed)
PROGRESS NOTE    Melanie Diaz  HKV:[id number] DOB: 1933/06/19 DOA: 07/21/2019 PCP: [name]., MD   Brief Narrative:  Melanie Diaz is a 86 y.o. female with medical history significant for DVT on anticoagulation, hypertension, diabetes mellitus and depression.  Patient was recently treated for UTI with Bactrim.  Brought to emergency room by family members for evaluation of increased swelling involving her right upper extremity. Venous Doppler studies were negative for DVT but she found to have bradycardia. Patient was on metoprolol at home, TSH elevated.  Subjective: Patient continued to feel very lethargic and weak.  Appetite is decreased.  Concern about her weakness.  Assessment & Plan:   Principal Problem:   Bradycardia Active Problems:   DM (diabetes mellitus), secondary (HCC)   Depression   Hypertension   DVT (deep venous thrombosis) (HCC)   Dehydration   Generalized weakness   History of anemia due to chronic kidney disease   Symptomatic bradycardia   PAD (peripheral artery disease) (HCC)   Malnutrition of moderate degree  Bradycardia.  Likely secondary to beta-blocker.  Apparently patient was on 2 separate doses of metoprolol 25 mg twice daily and 50 mg twice daily, not sure what she was taking.  Bradycardia improved after holding beta-blocker. Echocardiogram with grade 1 diastolic dysfunction, without any regional wall motion abnormalities, normal EF.  Mildly elevated BNP at 668.  Patient does not appear volume overload, has CKD. TSH elevated at 7.9 with free T4 within normal limit, free T3 decreased. -Continue holding metoprolol for now. -Cardiology was consulted from ED-appreciate their recommendations, they are recommending restart of metoprolol at 12.5 mg twice daily once heart rate stabilizes in 80s.  None anion gap metabolic acidosis.  Seems chronic, most likely secondary to her kidney disease.  Lactic acid within normal limit.  Mild improvement in  bicarb today. -Start her on bicarb tablets. -Was consulted-appreciate their recommendations.  Mild hyponatremia.  Sodium at 132.  Poor natremia labs consistent with hyponatremia secondary to renal failure. -Continue to monitor.  AKI with CKD stage IIIb.  Likely prerenal due to bradycardia and poor p.o. intake.  Started improving with IV hydration. -Nephrology consult-appreciate their recommendations. -Renal ultrasound-consistent with chronic renal disease. -Continue to monitor. -Diet nephrotoxins.  Hypothyroidism.  Patient with elevated TSH, low free T3 and T4 within normal limits are more consistent with hypothyroidism. -Start her on Synthroid 50 MCG daily.  She will need a repeat TSH in 4 weeks.  Generalized weakness.  Be related to her dehydration, bradycardia poor p.o. intake. hypothyroidism  can be contribute to this complaint.  Worsening renal function and anemia can be contributory. -Get PT/OT evaluation-unable to participate yesterday. -Dietitian consult.  Hypertension.  Blood pressure elevated. -Continue home dose of amlodipine and losartan. -Increase hydralazine to 50 mg 3 times a day. -Continue to monitor.  Anemia of chronic disease.  Hemoglobin at 7.2.  No obvious bleeding.  CBC more consistent with anemia of chronic disease.  Anemia panel with iron deficiency. -Give her 1 unit of packed RBC today. -Will give IV iron tomorrow. -Might need EPO.  Type 2 diabetes.  Well-controlled with A1c of 5.9. -Holding home p.o. meds. -Sensitive sliding scale as needed.  Right upper extremity edema with history of DVT.  Improving.   Venous Doppler negative for VTE.  Patient is on Coumadin with INR of 1.7 today. -Continue Coumadin per pharmacy.  PAD.  S/p right BKA. -Continue statin.  Objective: Vitals:   07/23/19 0746 07/23/19 1205 07/23/19 1308 07/23/19  1332  BP: (!) 168/47 (!) 166/49 (!) 157/77 (!) 148/91  Pulse: 79 65 68 67  Resp: 16 18 18 18   Temp: 98.3 F (36.8 C) (!)  97.5 F (36.4 C) 98 F (36.7 C) (!) 97.5 F (36.4 C)  TempSrc:  Oral Oral Axillary  SpO2: 96% 100% 98% 98%  Weight:      Height:        Intake/Output Summary (Last 24 hours) at 07/23/2019 1541 Last data filed at 07/23/2019 1516 Gross per 24 hour  Intake 290 ml  Output 650 ml  Net -360 ml   Filed Weights   07/21/19 2046 07/22/19 0433 07/23/19 0603  Weight: 58.1 kg 57.5 kg 56.6 kg    Examination:  General exam: Chronically ill-appearing lady, appears calm and comfortable  Respiratory system: Clear to auscultation. Respiratory effort normal. Cardiovascular system: S1 & S2 heard, RRR. No JVD, murmurs, rubs, gallops or clicks. Gastrointestinal system: Soft, nontender, nondistended, bowel sounds positive. Central nervous system: Alert and oriented. No focal neurological deficits.Symmetric 5 x 5 power. Extremities: Right BKA, no edema, no cyanosis, pulses intact and symmetrical. Skin: No rashes, lesions or ulcers Psychiatry: Judgement and insight appear normal.    DVT prophylaxis: Coumadin Code Status: Full Family Communication: Daughter was updated on phone. Disposition Plan: Patient lives alone and unable to take care of herself due to worsening generalized weakness.  Might need is SNF placement.  PT ordered, unable to participate yesterday due to extreme fatigue.  Consultants:   Cardiology  Nephrology  Procedures:  Antimicrobials:   Data Reviewed: I have personally reviewed following labs and imaging studies  CBC: Recent Labs  Lab 07/21/19 1221 07/22/19 0448 07/23/19 0450  WBC 7.0 10.3 8.5  HGB 8.1* 7.6* 7.2*  HCT 24.4* 23.1* 22.9*  MCV 85.0 85.2 87.1  PLT 388 399 638   Basic Metabolic Panel: Recent Labs  Lab 07/21/19 1221 07/22/19 0448 07/23/19 0450  NA 130* 131* 132*  K 4.3 4.1 4.4  CL 103 106 109  CO2 17* 16* 18*  GLUCOSE 143* 104* 106*  BUN 39* 37* 36*  CREATININE 2.26* 2.08* 2.11*  CALCIUM 8.0* 7.7* 7.6*  PHOS  --   --  4.9*    GFR: Estimated Creatinine Clearance: 16 mL/min (A) (by C-G formula based on SCr of 2.11 mg/dL (H)). Liver Function Tests: Recent Labs  Lab 07/21/19 1221 07/23/19 0450  AST 25  --   ALT 27  --   ALKPHOS 83  --   BILITOT 0.2*  --   PROT 5.7*  --   ALBUMIN 2.2* 1.7*   No results for input(s): LIPASE, AMYLASE in the last 168 hours. No results for input(s): AMMONIA in the last 168 hours. Coagulation Profile: Recent Labs  Lab 07/21/19 1221 07/22/19 0448 07/23/19 0450  INR 1.8* 1.7* 2.2*   Cardiac Enzymes: No results for input(s): CKTOTAL, CKMB, CKMBINDEX, TROPONINI in the last 168 hours. BNP (last 3 results) No results for input(s): PROBNP in the last 8760 hours. HbA1C: Recent Labs    07/21/19 1905  HGBA1C 5.9*   CBG: Recent Labs  Lab 07/22/19 1157 07/22/19 1642 07/22/19 2019 07/23/19 0748 07/23/19 1219  GLUCAP 130* 105* 128* 94 140*   Lipid Profile: No results for input(s): CHOL, HDL, LDLCALC, TRIG, CHOLHDL, LDLDIRECT in the last 72 hours. Thyroid Function Tests: Recent Labs    07/21/19 1905 07/22/19 0448  FREET4 1.08  --   T3FREE  --  1.6*   Anemia Panel: Recent Labs  07/22/19 1410  VITAMINB12 449  FOLATE 12.0  FERRITIN 31  TIBC 185*  IRON 14*  RETICCTPCT 1.7   Sepsis Labs: Recent Labs  Lab 07/22/19 0918 07/22/19 1157  LATICACIDVEN 0.5 0.7    Recent Results (from the past 240 hour(s))  Urine Culture     Status: None   Collection Time: 07/14/19 12:00 PM   Specimen: Urine   UR  Result Value Ref Range Status   Urine Culture, Routine Final report  Final   Organism ID, Bacteria Comment  Final    Comment: Culture shows less than 10,000 colony forming units of bacteria per milliliter of urine. This colony count is not generally considered to be clinically significant.   SARS CORONAVIRUS 2 (TAT 6-24 HRS) Nasopharyngeal     Status: None   Collection Time: 07/21/19  2:34 PM   Specimen: Nasopharyngeal  Result Value Ref Range Status    SARS Coronavirus 2 NEGATIVE NEGATIVE Final    Comment: (NOTE) SARS-CoV-2 target nucleic acids are NOT DETECTED. The SARS-CoV-2 RNA is generally detectable in upper and lower respiratory specimens during the acute phase of infection. Negative results do not preclude SARS-CoV-2 infection, do not rule out co-infections with other pathogens, and should not be used as the sole basis for treatment or other patient management decisions. Negative results must be combined with clinical observations, patient history, and epidemiological information. The expected result is Negative. Fact Sheet for Patients: SugarRoll.be Fact Sheet for Healthcare Providers: https://www.woods-mathews.com/ This test is not yet approved or cleared by the [place] FDA and  has been authorized for detection and/or diagnosis of SARS-CoV-2 by FDA under an Emergency Use Authorization (EUA). This EUA will remain  in effect (meaning this test can be used) for the duration of the COVID-19 declaration under Section 56 4(b)(1) of the Act, 21 U.S.C. section 360bbb-3(b)(1), unless the authorization is terminated or revoked sooner. Performed at [facility], [place] [address] [place], [place] [address]      Radiology Studies: US RENAL  Result Date: 07/22/2019 CLINICAL DATA:  Acute kidney injury EXAM: RENAL / URINARY TRACT ULTRASOUND COMPLETE COMPARISON:  None. FINDINGS: Right Kidney: Renal measurements: 11 x 5.3 x 5.5 cm = volume: 166 mL. Cortex is echogenic. No hydronephrosis. Cyst in the lower pole measuring 15 mm. Shadowing echogenic focus in the mid to lower pole of right kidney may reflect a small stone. Left Kidney: Renal measurements: 11.7 x 5.6 x 5.3 cm = volume: 166 mL. Cortex is echogenic. No hydronephrosis. Cyst in the midpole measuring 11 mm. Bladder: Appears normal for degree of bladder distention. Other: Bilateral pleural effusions are noted. Small amount of abdominal  ascites within the 4 quadrants IMPRESSION: 1. Echogenic kidneys consistent with medical renal disease. No hydronephrosis. 2. Possible 5 mm stone in the lower pole right kidney 3. Bilateral pleural effusions and small amount of ascites Electronically Signed   By: Donavan Foil M.D.   On: 07/22/2019 15:28   ECHOCARDIOGRAM COMPLETE  Result Date: 07/22/2019    ECHOCARDIOGRAM REPORT   Patient Name:   Melanie Diaz Date of Exam: 07/21/2019 Medical Rec #:  [id number]        Height:       63.0 in Accession #:    [id number]       Weight:       120.0 lb Date of Birth:  Apr 06, 1934        BSA:          1.556 m Patient Age:  85 years         BP:           170/48 mmHg Patient Gender: F                HR:           53 bpm. Exam Location:  [facility] Procedure: 2D Echo, Cardiac Doppler and Color Doppler Indications:     I42.9 Cardiomyopathy  History:         Patient has prior history of Echocardiogram examinations, most                  recent 08/11/2017. Risk Factors:Hypertension, Dyslipidemia and                  Diabetes. DVT.  Sonographer:     Wilford Sports Rodgers-Jones Referring Phys:  [id number] GNFAOZHY AGBATA Diagnosing Phys: Nelva Bush MD  Sonographer Comments: Technically difficult study due to poor echo windows. IMPRESSIONS  1. Left ventricular ejection fraction, by estimation, is 60 to 65%. The left ventricle has normal function. Left ventricular endocardial border not optimally defined to evaluate regional wall motion. Left ventricular diastolic parameters are consistent with Grade I diastolic dysfunction (impaired relaxation). Elevated left atrial pressure.  2. Right ventricular systolic function is normal. The right ventricular size is normal. mildly increased right ventricular wall thickness. Tricuspid regurgitation signal is inadequate for assessing PA pressure.  3. The mitral valve is degenerative. Trivial mitral valve regurgitation. No evidence of mitral stenosis.  4. The aortic valve was not well visualized. Aortic  valve regurgitation is trivial. No aortic stenosis is present. FINDINGS  Left Ventricle: Left ventricular ejection fraction, by estimation, is 60 to 65%. The left ventricle has normal function. Left ventricular endocardial border not optimally defined to evaluate regional wall motion. The left ventricular internal cavity size was normal in size. There is no left ventricular hypertrophy. Left ventricular diastolic parameters are consistent with Grade I diastolic dysfunction (impaired relaxation). Elevated left atrial pressure. Right Ventricle: The right ventricular size is normal. Mildly increased right ventricular wall thickness. Right ventricular systolic function is normal. Tricuspid regurgitation signal is inadequate for assessing PA pressure. Left Atrium: Left atrial size was not well visualized. Right Atrium: Right atrial size was not well visualized. Pericardium: There is no evidence of pericardial effusion. Mitral Valve: The mitral valve is degenerative in appearance. There is mild thickening of the mitral valve leaflet(s). There is moderate calcification of the mitral valve leaflet(s). Severe mitral annular calcification. Trivial mitral valve regurgitation. No evidence of mitral valve stenosis. Tricuspid Valve: The tricuspid valve is not well visualized. Tricuspid valve regurgitation is trivial. Aortic Valve: The aortic valve was not well visualized. Aortic valve regurgitation is trivial. No aortic stenosis is present. Pulmonic Valve: The pulmonic valve was not well visualized. Pulmonic valve regurgitation is not visualized. No evidence of pulmonic stenosis. Aorta: The aortic root is normal in size and structure. Pulmonary Artery: The pulmonary artery is not well seen. Venous: The inferior vena cava was not well visualized. IAS/Shunts: The interatrial septum was not well visualized.  LEFT VENTRICLE PLAX 2D LVIDd:         4.44 cm  Diastology LVIDs:         2.41 cm  LV e' lateral:   5.66 cm/s LV PW:          0.95 cm  LV E/e' lateral: 24.2 LV IVS:        0.82 cm  LV e' medial:    6.64  cm/s LVOT diam:     1.60 cm  LV E/e' medial:  20.6 LV SV:         65 LV SV Index:   42 LVOT Area:     2.01 cm  RIGHT VENTRICLE RV S prime:     16.40 cm/s LEFT ATRIUM         Index LA diam:    5.00 cm 3.21 cm/m  AORTIC VALVE LVOT Vmax:   107.00 cm/s LVOT Vmean:  77.000 cm/s LVOT VTI:    0.322 m  AORTA Ao Root diam: 3.00 cm MITRAL VALVE MV Area (PHT): 3.91 cm     SHUNTS MV Decel Time: 194 msec     Systemic VTI:  0.32 m MV E velocity: 137.00 cm/s  Systemic Diam: 1.60 cm MV A velocity: 145.00 cm/s MV E/A ratio:  0.94 Harrell Gave End MD Electronically signed by Nelva Bush MD Signature Date/Time: 07/22/2019/7:20:24 AM    Final     Scheduled Meds: . amLODipine  10 mg Oral Daily  . atorvastatin  40 mg Oral q1800  . hydrALAZINE  50 mg Oral Q8H  . insulin aspart  0-15 Units Subcutaneous TID WC  . insulin aspart  4 Units Subcutaneous TID WC  . levothyroxine  50 mcg Oral Q0600  . losartan  50 mg Oral Q24H  . mirtazapine  30 mg Oral QHS  . multivitamin with minerals  1 tablet Oral Daily  . sodium bicarbonate  650 mg Oral TID  . sodium chloride flush  3 mL Intravenous Once  . warfarin  1 mg Oral Daily  . Warfarin - Physician Dosing Inpatient   Does not apply q1800   Continuous Infusions:   LOS: 2 days   Time spent: 40 minutes.   Lorella Nimrod, MD [facility]  If 7PM-7AM, please contact night-coverage [email or web address]  07/23/2019, 3:41 PM   This record has been created using Systems analyst. Errors have been sought and corrected,but may not always be located. Such creation errors do not reflect on the standard of care.

## 2019-07-23 NOTE — Progress Notes (Signed)
   07/23/19 2000  Clinical Encounter Type  Visited With Patient;Health care provider  Visit Type Follow-up;Spiritual support  Referral From Chaplain  Consult/Referral To Chaplain  Chaplain stopped in to see patient. She was taking meds, but also looked very tired. Chaplain introduced herself, explained she was following up to assist with AD, but will have someone visit her tomorrow morning.

## 2019-07-23 NOTE — Consult Note (Signed)
ANTICOAGULATION CONSULT NOTE - Initial Consult  Pharmacy Consult for Warfarin Indication: Hx of DVT  Allergies  Allergen Reactions  . Amoxicillin Other (See Comments)    Has patient had a PCN reaction causing immediate rash, facial/tongue/throat swelling, SOB or lightheadedness with hypotension: Unknown Has patient had a PCN reaction causing severe rash involving mucus membranes or skin necrosis: Unknown Has patient had a PCN reaction that required hospitalization: Unknown Has patient had a PCN reaction occurring within the last 10 years: Unknown If all of the above answers are "NO", then may proceed with Cephalosporin use.   . Azithromycin     vomiting and stomach upset    Patient Measurements: Height: 5\' 2"  (157.5 cm) Weight: 124 lb 11.2 oz (56.6 kg) IBW/kg (Calculated) : 50.1  Vital Signs: Temp: 98.3 F (36.8 C) (03/11 0746) Temp Source: Oral (03/11 0603) BP: 168/47 (03/11 0746) Pulse Rate: 79 (03/11 0746)  Labs: Recent Labs    07/21/19 1221 07/21/19 1221 07/21/19 1905 07/22/19 0448 07/23/19 0450  HGB 8.1*   < >  --  7.6* 7.2*  HCT 24.4*  --   --  23.1* 22.9*  PLT 388  --   --  399 357  LABPROT 20.5*  --   --  19.4* 24.2*  INR 1.8*  --   --  1.7* 2.2*  CREATININE 2.26*  --   --  2.08* 2.11*  TROPONINIHS 21*  --  17  --   --    < > = values in this interval not displayed.    Estimated Creatinine Clearance: 16 mL/min (A) (by C-G formula based on SCr of 2.11 mg/dL (H)).   Medical History: Past Medical History:  Diagnosis Date  . Depression   . Diabetes mellitus without complication ([place])   . History of deep vein thrombosis    about 30 years ago  . Hyperlipidemia 07/06/2005  . Hypertension     Medications:  Regimen of warfarin 2 mg daily prior to admission  Assessment: Patient is an 86 y/o F with a history of DVT on chronic anticoagulation with warfarin who is admitted for symptomatic bradycardia. Pharmacy has been consulted to manage warfarin therapy.    Hgb 8.1 >> 7.6>>7.2, platelets trending down. May be dilution as patient was receiving fluids. Hemoglobin appears consistent with patient's baseline secondary to CKD/IDA  3/9 INR: 1.8, sub-therapeutic, no warfarin given 3/10 INR: 1.7, sub-therapeutic, 2 mg given  3/11 INR: 2.2, therapeutic  Patient missed home dose on 3/9, INR increased 0.5 after 2 mg dose on 3/10. Increased warfarin sensitivity secondary to CHF? Will decrease to warfarin 1 mg.  Goal of Therapy:  INR 2-3 Monitor platelets by anticoagulation protocol: Yes   Plan:  -Warfarin 1 mg tonight -INR tomorrow morning -CBC per protocol  [place] Resident 07/23/2019,7:55 AM

## 2019-07-23 NOTE — Progress Notes (Signed)
12:30pm: [place] recieved phone call in chaplain suite from Pt.'s granddaughter Loma Sousa.  Gdtr said pt. would like to complete a durable POA and HCPOA after having a 'scare' last night; gdtr. says she has been helping pt. manage her finances but pt.'s dtr. is designated visitor since she brought pt. to hospital.  Pt. would prefer to discuss documents w/o family present, gdtr. shared.  [place] agreed to provide paperwork and AD for pt. and to assess what assistance might be needed.  2pm: [place] visited pt. Dtr. @ bedside.  Pt. said she'd like to talk to [facility] late, but after dtr. excused herself, pt. discussed concern to get Will, AD, and DPOA completed.  Pt. shared there are complicated family dynamics that make inheritance of her property non straightforward; there has been legal trouble getting property left to her husband by pt.'s mother in law into pt.'s possession after her husband died.  Pt. requested [place] assist in creating will, but [place] judged the particular wishes of pt. too complicated for [facility] to adequately capture in a legal document.  [place] discussed AD w/pt.  Pt. became increasingly tearful and worried as [place] discussed various questions on Living Will; Pt. would like 'to be kept plugged in' in all situations discussed, but document was not finalized as pt. could not think of a suitable MPOA and needed more time to consider options on Living Will.  [place] will make referral to evening chaplain to follow up. Pt. shared multiple close family member have died in the last ten years; pt. experiencing tearful grief in recounting these losses; shared feeling overwhelmed by her current health concerns and the uncertainty surrounding her last wishes.  Pt. says she is a [place]; [place] offered prayer for pt.'s coping and for the logistical concerns surrounding her will, AD, etc. [place] spoke w/ 2A secretary about the possibility of having a lawyer come and help pt. complete her will and [place]; secretary said this could be arranged; pt. can make  arrangements and then communicate w/RN.  [place] conveyed this message to Pt.  No further needs expressed at this time.     07/23/19 1400  Clinical Encounter Type  Visited With Patient;Family  Visit Type Initial;Spiritual support;Social support;Psychological support ([place] representative)  Referral From Nurse;Family  Consult/Referral To Chaplain  Recommendations follow up w/pt. to discuss AD  Spiritual Encounters  Spiritual Needs Prayer;Other (Comment) (Legal documents; AD)  Stress Factors  Patient Stress Factors Health changes;Lack of caregivers;Lack of knowledge;Loss of control;Major life changes;Loss;Family relationships

## 2019-07-23 NOTE — Plan of Care (Signed)
  Problem: Health Behavior/Discharge Planning: Goal: Ability to manage health-related needs will improve Outcome: Not Progressing   

## 2019-07-23 NOTE — Progress Notes (Signed)
SUBJECTIVE: No acute events overnight. Pr the nurse patient remains bradycardic and hypertensive.   Vitals:   07/23/19 0244 07/23/19 0603 07/23/19 0746 07/23/19 1205  BP: (!) 177/44 (!) 196/56 (!) 168/47 (!) 166/49  Pulse: 67 82 79 65  Resp:  18 16 18   Temp:  98 F (36.7 C) 98.3 F (36.8 C) (!) 97.5 F (36.4 C)  TempSrc:  Oral  Oral  SpO2: 99% 97% 96% 100%  Weight:  56.6 kg    Height:        Intake/Output Summary (Last 24 hours) at 07/23/2019 1221 Last data filed at 07/23/2019 0945 Gross per 24 hour  Intake 650 ml  Output 570 ml  Net 80 ml    LABS: Basic Metabolic Panel: Recent Labs    07/22/19 0448 07/23/19 0450  NA 131* 132*  K 4.1 4.4  CL 106 109  CO2 16* 18*  GLUCOSE 104* 106*  BUN 37* 36*  CREATININE 2.08* 2.11*  CALCIUM 7.7* 7.6*  PHOS  --  4.9*   Liver Function Tests: Recent Labs    07/21/19 1221 07/23/19 0450  AST 25  --   ALT 27  --   ALKPHOS 83  --   BILITOT 0.2*  --   PROT 5.7*  --   ALBUMIN 2.2* 1.7*   No results for input(s): LIPASE, AMYLASE in the last 72 hours. CBC: Recent Labs    07/22/19 0448 07/23/19 0450  WBC 10.3 8.5  HGB 7.6* 7.2*  HCT 23.1* 22.9*  MCV 85.2 87.1  PLT 399 357   Cardiac Enzymes: No results for input(s): CKTOTAL, CKMB, CKMBINDEX, TROPONINI in the last 72 hours. BNP: Invalid input(s): POCBNP D-Dimer: No results for input(s): DDIMER in the last 72 hours. Hemoglobin A1C: Recent Labs    07/21/19 1905  HGBA1C 5.9*   Fasting Lipid Panel: No results for input(s): CHOL, HDL, LDLCALC, TRIG, CHOLHDL, LDLDIRECT in the last 72 hours. Thyroid Function Tests: Recent Labs    07/22/19 0448  T3FREE 1.6*   Anemia Panel: Recent Labs    07/22/19 1410  VITAMINB12 449  FOLATE 12.0  FERRITIN 31  TIBC 185*  IRON 14*  RETICCTPCT 1.7     PHYSICAL EXAM General: Well developed, well nourished, in no acute distress HEENT:  Normocephalic and atramatic Neck:  No JVD.  Lungs: Clear bilaterally to auscultation and  percussion. Heart: HRRR . Normal S1 and S2 without gallops or murmurs.  Abdomen: Bowel sounds are positive, abdomen soft and non-tender  Msk:  Back normal, normal gait. Normal strength and tone for age. Extremities: No clubbing, cyanosis or edema.   Neuro: Alert and oriented X 3. Psych:  Good affect, responds appropriately  TELEMETRY: NSR 70s.  ASSESSMENT AND PLAN: On assessment, patient's HR was 76, but per nurses patient remained bradycardic in the 50s overnight. Therefore will continue to hold beta blockers. With continued hypertension, hospitalist already increased hydralazine which I agree with. Please continue other antihypertensives. If patient's HR continues to get back to baseline which appears to be in the 80s, would recommend starting low dose metoprolol succinate at 12.5mg  daily. With continued elevated Cr, will not increase Losartan and will hold on diuretics as AKI most likely pre-renal. Will continue to follow.     Principal Problem:   Bradycardia Active Problems:   DM (diabetes mellitus), secondary (HCC)   Depression   Hypertension   DVT (deep venous thrombosis) (HCC)   Dehydration   Generalized weakness   History of anemia due to chronic  kidney disease   Symptomatic bradycardia   PAD (peripheral artery disease) ([place])   Malnutrition of moderate degree    Adaline Sill, NP-C 07/23/2019 12:21 PM

## 2019-07-24 ENCOUNTER — Telehealth: Payer: Self-pay | Admitting: Pharmacist

## 2019-07-24 LAB — TYPE AND SCREEN
ABO/RH(D): AB POS
Antibody Screen: NEGATIVE
Unit division: 0

## 2019-07-24 LAB — CBC
HCT: 28.7 % — ABNORMAL LOW (ref 36.0–46.0)
Hemoglobin: 9.4 g/dL — ABNORMAL LOW (ref 12.0–15.0)
MCH: 28.2 pg (ref 26.0–34.0)
MCHC: 32.8 g/dL (ref 30.0–36.0)
MCV: 86.2 fL (ref 80.0–100.0)
Platelets: 379 10*3/uL (ref 150–400)
RBC: 3.33 MIL/uL — ABNORMAL LOW (ref 3.87–5.11)
RDW: 14.6 % (ref 11.5–15.5)
WBC: 10.1 10*3/uL (ref 4.0–10.5)
nRBC: 0 % (ref 0.0–0.2)

## 2019-07-24 LAB — GLUCOSE, CAPILLARY
Glucose-Capillary: 121 mg/dL — ABNORMAL HIGH (ref 70–99)
Glucose-Capillary: 104 mg/dL — ABNORMAL HIGH (ref 70–99)
Glucose-Capillary: 77 mg/dL (ref 70–99)
Glucose-Capillary: 115 mg/dL — ABNORMAL HIGH (ref 70–99)

## 2019-07-24 LAB — RENAL FUNCTION PANEL
Albumin: 1.9 g/dL — ABNORMAL LOW (ref 3.5–5.0)
Anion gap: 7 (ref 5–15)
BUN: 38 mg/dL — ABNORMAL HIGH (ref 8–23)
CO2: 18 mmol/L — ABNORMAL LOW (ref 22–32)
Calcium: 7.6 mg/dL — ABNORMAL LOW (ref 8.9–10.3)
Chloride: 108 mmol/L (ref 98–111)
Creatinine, Ser: 2.15 mg/dL — ABNORMAL HIGH (ref 0.44–1.00)
GFR calc Af Amer: 24 mL/min — ABNORMAL LOW (ref >60)
GFR calc non Af Amer: 21 mL/min — ABNORMAL LOW (ref >60)
Glucose, Bld: 136 mg/dL — ABNORMAL HIGH (ref 70–99)
Phosphorus: 5.2 mg/dL — ABNORMAL HIGH (ref 2.5–4.6)
Potassium: 4.4 mmol/L (ref 3.5–5.1)
Sodium: 133 mmol/L — ABNORMAL LOW (ref 135–145)

## 2019-07-24 LAB — BPAM RBC
Blood Product Expiration Date: 202104072359
ISSUE DATE / TIME: 202103111312
Unit Type and Rh: 6200

## 2019-07-24 LAB — PROTIME-INR
INR: 2.1 — ABNORMAL HIGH (ref 0.8–1.2)
Prothrombin Time: 23.6 seconds — ABNORMAL HIGH (ref 11.4–15.2)

## 2019-07-24 MED ORDER — WARFARIN SODIUM 2 MG PO TABS
2.0000 mg | ORAL_TABLET | Freq: Every day | ORAL | Status: DC
  Filled 2019-07-24: qty 1

## 2019-07-24 MED ORDER — SODIUM CHLORIDE 0.9 % IV SOLN: INTRAVENOUS | Status: DC

## 2019-07-24 MED ORDER — SODIUM CHLORIDE 0.9 % IV SOLN
510.0000 mg | Freq: Once | INTRAVENOUS | Status: AC
  Administered 2019-07-24: 510 mg via INTRAVENOUS
  Filled 2019-07-24: qty 17

## 2019-07-24 MED ORDER — HYDRALAZINE HCL 20 MG/ML IJ SOLN
10.0000 mg | Freq: Four times a day (QID) | INTRAMUSCULAR | Status: DC | PRN
  Filled 2019-07-24: qty 0.5

## 2019-07-24 NOTE — Progress Notes (Signed)
Melanie Diaz visited pt. as follow up from visit yesterday; pt. seemed uncomfortable by her posture in bed; she shared 'I'm not doing too well' sharing further that she had not slept very well and that the medication she was receiving seemed to be making her nauseous. [place] inquired whether pt. had made arrangements w/lawyer to create will/DPOA; pt. said lawyer is not in good health and cannot come to [facility] @ this time.  Pt. also wants to wait 'until I can get my mind straight' before she completes AD or other legal paperwork.  [place] may attempt return visit if possible this weekend.  No further needs expressed at this time.    07/24/19 1100  Clinical Encounter Type  Visited With Patient  Visit Type Follow-up  Consult/Referral To Chaplain  Stress Factors  Patient Stress Factors Health changes;Lack of knowledge;Loss of control

## 2019-07-24 NOTE — Consult Note (Signed)
ANTICOAGULATION CONSULT NOTE - Initial Consult  Pharmacy Consult for Apixaban Indication: Hx of DVT  Allergies  Allergen Reactions  . Amoxicillin Other (See Comments)    Has patient had a PCN reaction causing immediate rash, facial/tongue/throat swelling, SOB or lightheadedness with hypotension: Unknown Has patient had a PCN reaction causing severe rash involving mucus membranes or skin necrosis: Unknown Has patient had a PCN reaction that required hospitalization: Unknown Has patient had a PCN reaction occurring within the last 10 years: Unknown If all of the above answers are "NO", then may proceed with Cephalosporin use.   . Azithromycin     vomiting and stomach upset    Patient Measurements: Height: 5\' 2"  (157.5 cm) Weight: 137 lb 2 oz (62.2 kg) IBW/kg (Calculated) : 50.1  Vital Signs: Temp: 98.2 F (36.8 C) (03/12 1547) BP: 166/48 (03/12 1547) Pulse Rate: 63 (03/12 1547)  Labs: Recent Labs    07/21/19 1905 07/22/19 0448 07/22/19 0448 07/23/19 0450 07/24/19 0533  HGB  --  7.6*   < > 7.2* 9.4*  HCT  --  23.1*  --  22.9* 28.7*  PLT  --  399  --  357 379  LABPROT  --  19.4*  --  24.2* 23.6*  INR  --  1.7*  --  2.2* 2.1*  CREATININE  --  2.08*  --  2.11* 2.15*  TROPONINIHS 17  --   --   --   --    < > = values in this interval not displayed.    Estimated Creatinine Clearance: 17.2 mL/min (A) (by C-G formula based on SCr of 2.15 mg/dL (H)).   Medical History: Past Medical History:  Diagnosis Date  . Depression   . Diabetes mellitus without complication ([place])   . History of deep vein thrombosis    about 30 years ago  . Hyperlipidemia 07/06/2005  . Hypertension     Medications:  Transitioning warfarin to apixaban per patient preference. Will need to confirm patient is able to afford apixaban.  Assessment: Patient is an 86 y/o F with a history of DVT on chronic anticoagulation with warfarin who is admitted for symptomatic bradycardia. Pharmacy has been  consulted to convert warfarin to apixaban.   Patient received 1 unit PRBC on 3/11. Hgb increased from 7.2 to 9.4.   3/9 INR: 1.8, sub-therapeutic, no warfarin given 3/10 INR: 1.7, sub-therapeutic, 2 mg given  3/11 INR: 2.2, therapeutic, 1 mg given 3/12 INR: 2.1, therapeutic  Goal of Therapy:  INR 2-3 Monitor platelets by anticoagulation protocol: Yes   Plan:  -D/c warfarin 2 mg tonight -Start apixaban 2.5 mg twice daily when INR <2 -INR tomorrow morning -CBC per protocol  [place] Resident 07/24/2019,4:48 PM

## 2019-07-24 NOTE — Consult Note (Signed)
[facility] CONSULT NOTE    Date: 07/24/2019                  Patient Name:  Melanie Diaz  MRN: [id number]  DOB: 13-Nov-1933  Age / Sex: 86 y.o., female         PCP: [name]., MD                 Service Requesting Consult:  Hospitalist                 Reason for Consult:  Acute kidney injury, chronic kidney disease stage IV baseline creatinine 1.75, EGFR 27            History of Present Illness: Patient is a 86 y.o. female with a PMHx of diabetes mellitus type 2, hyperlipidemia, hypertension, chronic kidney disease stage IV baseline creatinine 1.75 with EGFR 27, who was admitted to [facility] on 07/21/2019 for evaluation of generalized weakness.  Patient apparently recently had a urinary tract infection which was treated with Bactrim as an outpatient.  She also developed right upper extremity swelling.  Patient also relates to me that she had very poor p.o. intake 1 week prior to admission.  She has known underlying chronic kidney disease with most recent baseline creatinine 1.75 with an EGFR 27.  She has longstanding diabetes mellitus type 2.  Her condition unfortunately worsened and she was brought in for further evaluation.  Creatinine has been as high as 2.26 over the course of this admission.  Creatinine now found to be 2.15.  She also appears to have some element of metabolic acidosis with serum bicarbonate of 18.  Earlier she was on IV fluid hydration.  She also received blood transfusion this admission.   Medications: Outpatient medications: Medications Prior to Admission  Medication Sig Dispense Refill Last Dose  . amLODipine (NORVASC) 10 MG tablet Take 1 tablet (10 mg total) by mouth daily. 90 tablet 0 07/21/2019 at 1000  . atorvastatin (LIPITOR) 40 MG tablet Take 1 tablet (40 mg total) by mouth daily at 6 PM. 30 tablet 5 07/20/2019 at 2200  . glipiZIDE (GLUCOTROL) 10 MG tablet Take 1 tablet (10 mg total) by mouth daily. 30 tablet 5 07/21/2019 at 1000   . losartan (COZAAR) 50 MG tablet Take 1 tablet (50 mg total) by mouth daily. 30 tablet 5 07/21/2019 at 1000  . metoprolol tartrate (LOPRESSOR) 25 MG tablet Take 25 mg by mouth every morning.   07/21/2019 at 1000  . metoprolol tartrate (LOPRESSOR) 50 MG tablet Take 1 tablet (50 mg total) by mouth 2 (two) times daily. (Patient taking differently: Take 50 mg by mouth every evening. ) 60 tablet 5 07/20/2019 at 2200  . mirtazapine (REMERON) 30 MG tablet Take 1 tablet (30 mg total) by mouth at bedtime. 30 tablet 5   . warfarin (COUMADIN) 2 MG tablet Take 1 tablet (2 mg total) by mouth daily. 30 tablet 5 07/20/2019 at 2200  . sulfamethoxazole-trimethoprim (BACTRIM) 400-80 MG tablet Take 1 tablet by mouth 2 (two) times daily. (Patient not taking: Reported on 07/21/2019) 14 tablet 0 Completed Course at Unknown time    Current medications: Current Facility-Administered Medications  Medication Dose Route Frequency Provider Last Rate Last Admin  . acetaminophen (TYLENOL) tablet 650 mg  650 mg Oral Q6H PRN Agbata, Tochukwu, MD   650 mg at 07/23/19 2039   Or  . acetaminophen (TYLENOL) suppository 650 mg  650 mg Rectal Q6H PRN Agbata,  Tochukwu, MD      . amLODipine (NORVASC) tablet 10 mg  10 mg Oral Daily Agbata, Tochukwu, MD   10 mg at 07/24/19 1032  . atorvastatin (LIPITOR) tablet 40 mg  40 mg Oral q1800 Agbata, Tochukwu, MD   40 mg at 07/23/19 1848  . hydrALAZINE (APRESOLINE) injection 10 mg  10 mg Intravenous Q6H PRN Adaline Sill, NP      . hydrALAZINE (APRESOLINE) tablet 50 mg  50 mg Oral Q8H Lorella Nimrod, MD   50 mg at 07/24/19 0514  . insulin aspart (novoLOG) injection 0-15 Units  0-15 Units Subcutaneous TID WC Agbata, Tochukwu, MD   2 Units at 07/24/19 0842  . insulin aspart (novoLOG) injection 4 Units  4 Units Subcutaneous TID WC Agbata, Tochukwu, MD   4 Units at 07/24/19 0843  . levothyroxine (SYNTHROID) tablet 50 mcg  50 mcg Oral Q0600 Lorella Nimrod, MD   50 mcg at 07/24/19 0514  . losartan (COZAAR)  tablet 50 mg  50 mg Oral Q24H Sharion Settler, NP   50 mg at 07/23/19 2039  . mirtazapine (REMERON) tablet 30 mg  30 mg Oral QHS Agbata, Tochukwu, MD   30 mg at 07/23/19 2039  . multivitamin with minerals tablet 1 tablet  1 tablet Oral Daily Lorella Nimrod, MD   1 tablet at 07/24/19 1032  . ondansetron (ZOFRAN) tablet 4 mg  4 mg Oral Q6H PRN Agbata, Tochukwu, MD   4 mg at 07/22/19 2019   Or  . ondansetron (ZOFRAN) injection 4 mg  4 mg Intravenous Q6H PRN Agbata, Tochukwu, MD   4 mg at 07/23/19 0944  . sodium bicarbonate tablet 650 mg  650 mg Oral TID Lorella Nimrod, MD   650 mg at 07/24/19 1032  . sodium chloride flush (NS) 0.9 % injection 3 mL  3 mL Intravenous Once Merlyn Lot, MD      . warfarin (COUMADIN) tablet 2 mg  2 mg Oral Daily Benita Gutter, RPH      . Warfarin - Physician Dosing Inpatient   Does not apply [id number] Collier Bullock, MD   Given at 07/23/19 1850      Allergies: Allergies  Allergen Reactions  . Amoxicillin Other (See Comments)    Has patient had a PCN reaction causing immediate rash, facial/tongue/throat swelling, SOB or lightheadedness with hypotension: Unknown Has patient had a PCN reaction causing severe rash involving mucus membranes or skin necrosis: Unknown Has patient had a PCN reaction that required hospitalization: Unknown Has patient had a PCN reaction occurring within the last 10 years: Unknown If all of the above answers are "NO", then may proceed with Cephalosporin use.   . Azithromycin     vomiting and stomach upset      Past Medical History: Past Medical History:  Diagnosis Date  . Depression   . Diabetes mellitus without complication ([place])   . History of deep vein thrombosis    about 30 years ago  . Hyperlipidemia 07/06/2005  . Hypertension      Past Surgical History: Past Surgical History:  Procedure Laterality Date  . LEG AMPUTATION Right      Family History: Family History  Problem Relation Age of Onset  . Kidney  failure Mother   . Heart attack Sister        Hx of MI  . Diabetes Brother        borderline diabetes  . Cancer Maternal Aunt        Ovarian cancer  .  Breast cancer Daughter      Social History: Social History   Socioeconomic History  . Marital status: Widowed    Spouse name: Not on file  . Number of children: 3  . Years of education: Not on file  . Highest education level: 12th grade  Occupational History  . Occupation: Retired  Tobacco Use  . Smoking status: Never Smoker  . Smokeless tobacco: Never Used  . Tobacco comment: smoking cessation materials not required  Substance and Sexual Activity  . Alcohol use: No  . Drug use: No  . Sexual activity: Not on file  Other Topics Concern  . Not on file  Social History Narrative   Lives at home by herself. Has a wheelchair. Daughter checks on her every day   Social Determinants of Health   Financial Resource Strain:   . Difficulty of Paying Living Expenses:   Food Insecurity:   . Worried About Charity fundraiser in the Last Year:   . Arboriculturist in the Last Year:   Transportation Needs:   . Film/video editor (Medical):   [place] Kitchen Lack of Transportation (Non-Medical):   Physical Activity: Inactive  . Days of Exercise per Week: 0 days  . Minutes of Exercise per Session: 0 min  Stress: No Stress Concern Present  . Feeling of Stress : Not at all  Social Connections: Unknown  . Frequency of Communication with Friends and Family: Not on file  . Frequency of Social Gatherings with Friends and Family: Not on file  . Attends Religious Services: Not on file  . Active Member of Clubs or Organizations: Not on file  . Attends Archivist Meetings: Not on file  . Marital Status: Patient refused  Intimate Partner Violence: Unknown  . Fear of Current or Ex-Partner: Patient refused  . Emotionally Abused: Patient refused  . Physically Abused: Patient refused  . Sexually Abused: Patient refused     Review of  Systems: Review of Systems  Constitutional: Positive for malaise/fatigue. Negative for chills and fever.  HENT: Negative for congestion, hearing loss and tinnitus.   Eyes: Negative for blurred vision and double vision.  Respiratory: Negative for cough, sputum production and shortness of breath.   Cardiovascular: Negative for chest pain, palpitations and orthopnea.  Gastrointestinal: Negative for diarrhea, nausea and vomiting.  Genitourinary: Negative for dysuria, frequency and urgency.  Musculoskeletal: Negative for myalgias.  Skin: Negative for itching and rash.  Neurological: Positive for weakness. Negative for dizziness and focal weakness.  Endo/Heme/Allergies: Negative for polydipsia. Does not bruise/bleed easily.  Psychiatric/Behavioral: The patient is not nervous/anxious.      Vital Signs: Blood pressure (!) 165/42, pulse 61, temperature 98 F (36.7 C), resp. rate 18, height _0  (1.575 m), weight 62.2 kg, SpO2 98 %.  Weight trends: Filed Weights   07/22/19 0433 07/23/19 0603 07/24/19 0452  Weight: 57.5 kg 56.6 kg 62.2 kg    Physical Exam: General: NAD, laying in bed  Head: Normocephalic, atraumatic.  Eyes: Anicteric, EOMI  Nose: Mucous membranes moist, not inflammed, nonerythematous.  Throat: Oropharynx nonerythematous, no exudate appreciated.   Neck: Supple, trachea midline.  Lungs:  Normal respiratory effort. Clear to auscultation BL without crackles or wheezes.  Heart: RRR. S1 and S2 normal without gallop, murmur, or rubs.  Abdomen:  BS normoactive. Soft, Nondistended, non-tender.  No masses or organomegaly.  Extremities: No pretibial edema. Mild right arm swelling  Neurologic: A&O X3, Motor strength is 5/5 in the all 4 extremities  Skin: No visible rashes, scars.    Lab results: Basic Metabolic Panel: Recent Labs  Lab 07/22/19 0448 07/23/19 0450 07/24/19 0533  NA 131* 132* 133*  K 4.1 4.4 4.4  CL 106 109 108  CO2 16* 18* 18*  GLUCOSE 104* 106* 136*   BUN 37* 36* 38*  CREATININE 2.08* 2.11* 2.15*  CALCIUM 7.7* 7.6* 7.6*  PHOS  --  4.9* 5.2*    Liver Function Tests: Recent Labs  Lab 07/21/19 1221 07/23/19 0450 07/24/19 0533  AST 25  --   --   ALT 27  --   --   ALKPHOS 83  --   --   BILITOT 0.2*  --   --   PROT 5.7*  --   --   ALBUMIN 2.2* 1.7* 1.9*   No results for input(s): LIPASE, AMYLASE in the last 168 hours. No results for input(s): AMMONIA in the last 168 hours.  CBC: Recent Labs  Lab 07/21/19 1221 07/21/19 1221 07/22/19 0448 07/23/19 0450 07/24/19 0533  WBC 7.0   < > 10.3 8.5 10.1  HGB 8.1*   < > 7.6* 7.2* 9.4*  HCT 24.4*   < > 23.1* 22.9* 28.7*  MCV 85.0  --  85.2 87.1 86.2  PLT 388   < > 399 357 379   < > = values in this interval not displayed.    Cardiac Enzymes: No results for input(s): CKTOTAL, CKMB, CKMBINDEX, TROPONINI in the last 168 hours.  BNP: Invalid input(s): POCBNP  CBG: Recent Labs  Lab 07/23/19 1219 07/23/19 1615 07/23/19 2113 07/24/19 0735 07/24/19 1229  GLUCAP 140* 162* 161* 121* 104*    Microbiology: Results for orders placed or performed during the hospital encounter of 07/21/19  SARS CORONAVIRUS 2 (TAT 6-24 HRS) Nasopharyngeal     Status: None   Collection Time: 07/21/19  2:34 PM   Specimen: Nasopharyngeal  Result Value Ref Range Status   SARS Coronavirus 2 NEGATIVE NEGATIVE Final    Comment: (NOTE) SARS-CoV-2 target nucleic acids are NOT DETECTED. The SARS-CoV-2 RNA is generally detectable in upper and lower respiratory specimens during the acute phase of infection. Negative results do not preclude SARS-CoV-2 infection, do not rule out co-infections with other pathogens, and should not be used as the sole basis for treatment or other patient management decisions. Negative results must be combined with clinical observations, patient history, and epidemiological information. The expected result is Negative. Fact Sheet for  Patients: SugarRoll.be Fact Sheet for Healthcare Providers: https://www.woods-mathews.com/ This test is not yet approved or cleared by the [place] FDA and  has been authorized for detection and/or diagnosis of SARS-CoV-2 by FDA under an Emergency Use Authorization (EUA). This EUA will remain  in effect (meaning this test can be used) for the duration of the COVID-19 declaration under Section 56 4(b)(1) of the Act, 21 U.S.C. section 360bbb-3(b)(1), unless the authorization is terminated or revoked sooner. Performed at [facility], [place] [address] [place], [place] [address]     Coagulation Studies: Recent Labs    07/22/19 0448 07/23/19 0450 07/24/19 0533  LABPROT 19.4* 24.2* 23.6*  INR 1.7* 2.2* 2.1*    Urinalysis: Recent Labs    07/21/19 1434  COLORURINE YELLOW*  LABSPEC 1.013  PHURINE 6.0  GLUCOSEU >=500*  HGBUR NEGATIVE  BILIRUBINUR NEGATIVE  KETONESUR NEGATIVE  PROTEINUR >=300*  NITRITE NEGATIVE  LEUKOCYTESUR NEGATIVE      Imaging: US RENAL  Result Date: 07/22/2019 CLINICAL DATA:  Acute kidney injury EXAM:  RENAL / URINARY TRACT ULTRASOUND COMPLETE COMPARISON:  None. FINDINGS: Right Kidney: Renal measurements: 11 x 5.3 x 5.5 cm = volume: 166 mL. Cortex is echogenic. No hydronephrosis. Cyst in the lower pole measuring 15 mm. Shadowing echogenic focus in the mid to lower pole of right kidney may reflect a small stone. Left Kidney: Renal measurements: 11.7 x 5.6 x 5.3 cm = volume: 166 mL. Cortex is echogenic. No hydronephrosis. Cyst in the midpole measuring 11 mm. Bladder: Appears normal for degree of bladder distention. Other: Bilateral pleural effusions are noted. Small amount of abdominal ascites within the 4 quadrants IMPRESSION: 1. Echogenic kidneys consistent with medical renal disease. No hydronephrosis. 2. Possible 5 mm stone in the lower pole right kidney 3. Bilateral pleural effusions and small amount of  ascites Electronically Signed   By: Donavan Foil M.D.   On: 07/22/2019 15:28      Assessment & Plan: Pt is a 86 y.o. female with a PMHx of diabetes mellitus type 2, hyperlipidemia, hypertension, chronic kidney disease stage IV baseline creatinine 1.75 with EGFR 27, who was admitted to [facility] on 07/21/2019 for evaluation of generalized weakness.  1.  Acute kidney injury/diabetes mellitus type 2 with chronic kidney disease/chronic kidney disease stage IV.  Acute kidney injury likely now secondary to dehydration as well as contributions from recent Bactrim use.  She was on IV fluid hydration but this was stopped.  Restart the patient IV fluid hydration with 0.9 normal saline at 40 cc/h.  Renal ultrasound was checked and showed increased echogenicity bilateral and no hydronephrosis noted.  Check SPEP, UPEP, ANA, ANCA antibodies, GBM antibodies, C3, C4, and urine protein to creatinine ratio.  No urgent indication for dialysis.  2.  Anemia of chronic kidney disease.  Hemoglobin now up to 9.4.  Hemoglobin was as low as 7.2 but patient did undergo blood transfusion.  Continue to monitor CBC closely.  3.  Metabolic acidosis.  Okay to continue the patient on sodium bicarbonate at this time.  Monitor serum bicarbonate levels.

## 2019-07-24 NOTE — Telephone Encounter (Signed)
Thank you. I will route message to nurse care manager covering my inbox during my absence starting 07/27/19    [name], PharmD Clinical Pharmacist [place] [phone]

## 2019-07-24 NOTE — Consult Note (Signed)
ANTICOAGULATION CONSULT NOTE - Initial Consult  Pharmacy Consult for Warfarin Indication: Hx of DVT  Allergies  Allergen Reactions  . Amoxicillin Other (See Comments)    Has patient had a PCN reaction causing immediate rash, facial/tongue/throat swelling, SOB or lightheadedness with hypotension: Unknown Has patient had a PCN reaction causing severe rash involving mucus membranes or skin necrosis: Unknown Has patient had a PCN reaction that required hospitalization: Unknown Has patient had a PCN reaction occurring within the last 10 years: Unknown If all of the above answers are "NO", then may proceed with Cephalosporin use.   . Azithromycin     vomiting and stomach upset    Patient Measurements: Height: 5\' 2"  (157.5 cm) Weight: 137 lb 2 oz (62.2 kg) IBW/kg (Calculated) : 50.1  Vital Signs: Temp: 98 F (36.7 C) (03/12 0732) BP: 179/50 (03/12 0732) Pulse Rate: 65 (03/12 0732)  Labs: Recent Labs    07/21/19 1221 07/21/19 1221 07/21/19 1905 07/22/19 0448 07/22/19 0448 07/23/19 0450 07/24/19 0533  HGB 8.1*   < >  --  7.6*   < > 7.2* 9.4*  HCT 24.4*   < >  --  23.1*  --  22.9* 28.7*  PLT 388   < >  --  399  --  357 379  LABPROT 20.5*   < >  --  19.4*  --  24.2* 23.6*  INR 1.8*   < >  --  1.7*  --  2.2* 2.1*  CREATININE 2.26*   < >  --  2.08*  --  2.11* 2.15*  TROPONINIHS 21*  --  17  --   --   --   --    < > = values in this interval not displayed.    Estimated Creatinine Clearance: 17.2 mL/min (A) (by C-G formula based on SCr of 2.15 mg/dL (H)).   Medical History: Past Medical History:  Diagnosis Date  . Depression   . Diabetes mellitus without complication ([place])   . History of deep vein thrombosis    about 30 years ago  . Hyperlipidemia 07/06/2005  . Hypertension     Medications:  Regimen of warfarin 2 mg daily prior to admission  Assessment: Patient is an 86 y/o F with a history of DVT on chronic anticoagulation with warfarin who is admitted for  symptomatic bradycardia. Pharmacy has been consulted to manage warfarin therapy.   Patient received 1 unit PRBC on 3/11. Hgb increased from 7.2 to 9.4.   3/9 INR: 1.8, sub-therapeutic, no warfarin given 3/10 INR: 1.7, sub-therapeutic, 2 mg given  3/11 INR: 2.2, therapeutic, 1 mg given 3/12 INR: 2.1, therapeutic  Resume home dose of warfarin 2 mg.   Goal of Therapy:  INR 2-3 Monitor platelets by anticoagulation protocol: Yes   Plan:  -Warfarin 2 mg tonight -INR tomorrow morning -CBC per protocol  [place] Resident 07/24/2019,7:48 AM

## 2019-07-24 NOTE — Progress Notes (Signed)
SUBJECTIVE: Patient states she feels better this morning compared to previous days. States her dizziness has improved but has occasional nausea. Daughter was updated yesterday over the phone and updated again this morning at bedside.   Vitals:   07/23/19 1925 07/24/19 0452 07/24/19 0732 07/24/19 0849  BP: (!) 159/53 (!) 162/49 (!) 179/50 (!) 179/41  Pulse: 68 70 65   Resp: 17 18 16    Temp: 98.2 F (36.8 C) 98.1 F (36.7 C) 98 F (36.7 C) 98.6 F (37 C)  TempSrc:      SpO2: 96% 98% 94%   Weight:  62.2 kg    Height:        Intake/Output Summary (Last 24 hours) at 07/24/2019 0858 Last data filed at 07/24/2019 0452 Gross per 24 hour  Intake 810 ml  Output 1400 ml  Net -590 ml    LABS: Basic Metabolic Panel: Recent Labs    07/23/19 0450 07/24/19 0533  NA 132* 133*  K 4.4 4.4  CL 109 108  CO2 18* 18*  GLUCOSE 106* 136*  BUN 36* 38*  CREATININE 2.11* 2.15*  CALCIUM 7.6* 7.6*  PHOS 4.9* 5.2*   Liver Function Tests: Recent Labs    07/21/19 1221 07/21/19 1221 07/23/19 0450 07/24/19 0533  AST 25  --   --   --   ALT 27  --   --   --   ALKPHOS 83  --   --   --   BILITOT 0.2*  --   --   --   PROT 5.7*  --   --   --   ALBUMIN 2.2*   < > 1.7* 1.9*   < > = values in this interval not displayed.   No results for input(s): LIPASE, AMYLASE in the last 72 hours. CBC: Recent Labs    07/23/19 0450 07/24/19 0533  WBC 8.5 10.1  HGB 7.2* 9.4*  HCT 22.9* 28.7*  MCV 87.1 86.2  PLT 357 379   Cardiac Enzymes: No results for input(s): CKTOTAL, CKMB, CKMBINDEX, TROPONINI in the last 72 hours. BNP: Invalid input(s): POCBNP D-Dimer: No results for input(s): DDIMER in the last 72 hours. Hemoglobin A1C: Recent Labs    07/21/19 1905  HGBA1C 5.9*   Fasting Lipid Panel: No results for input(s): CHOL, HDL, LDLCALC, TRIG, CHOLHDL, LDLDIRECT in the last 72 hours. Thyroid Function Tests: Recent Labs    07/22/19 0448  T3FREE 1.6*   Anemia Panel: Recent Labs     07/22/19 1410  VITAMINB12 449  FOLATE 12.0  FERRITIN 31  TIBC 185*  IRON 14*  RETICCTPCT 1.7     PHYSICAL EXAM General: Well developed, well nourished, in no acute distress HEENT:  Normocephalic and atramatic Neck:  No JVD.  Lungs: Clear bilaterally to auscultation and percussion. Heart: HRRR . Normal S1 and S2 without gallops or murmurs.  Abdomen: Bowel sounds are positive, abdomen soft and non-tender  Msk:  Back normal, normal gait. Normal strength and tone for age. Extremities: No clubbing, cyanosis or edema.   Neuro: Alert and oriented X 3. Psych:  Good affect, responds appropriately  TELEMETRY: 70 NSR  ASSESSMENT AND PLAN: Patient continues to remain hypertensive even with increasing her hydralazine yesterday to 50mg  TID and continuing her home doses of amlodipine 10mg  and losartan 50mg . Patient continues to have an increased pulse pressure with diastolics as low as 25E. Due to this, I do not recommend increasing hydralazine anymore as further arteriolar dilation could cause it to decrease. HR maintained  in the 60s overnight and was in the 70s this morning while assessing the patient. Will continue to monitor if the addition of levothyroxine will also increase her HR  With her continued hypertension with tripe therapy, plan to get a renal artery ultrasound to rule out renal artery stenosis. As HR continues to improve please consider the addition of 12.5mg  metoprolol succinate once daily. I am comfortable with the patient's HR >50 as long as she is asymptomatic. With such a large pulse pressure, more realistic systolic goals of 283-662 is acceptable and therefore increased criteria for PRN hydralazine to systolic >947. Will continue to hold diuretics and refrain from increasing her losartan in the setting of worsening kidney function. Will continue to monitor the patient.       Principal Problem:   Bradycardia Active Problems:   DM (diabetes mellitus), secondary ([place])    Depression   Hypertension   DVT (deep venous thrombosis) (HCC)   Dehydration   Generalized weakness   History of anemia due to chronic kidney disease   Symptomatic bradycardia   PAD (peripheral artery disease) ([place])   Malnutrition of moderate degree    Adaline Sill, NP-C 07/24/2019 8:58 AM

## 2019-07-24 NOTE — Telephone Encounter (Signed)
Patient's daughter, Loistine Chance, wants to talk to you regarding Ms. Bobak switching over from Warfarin to Eliquis since she has been in the hospital.

## 2019-07-24 NOTE — Care Management Important Message (Signed)
Important Message  Patient Details  Name: Melanie Diaz MRN: [id number] Date of Birth: 11/19/1933   Medicare Important Message Given:  Yes     Dannette Sabriyah 07/24/2019, 12:55 PM

## 2019-07-24 NOTE — Progress Notes (Signed)
Physical Therapy Evaluation Patient Details Name: Melanie Diaz MRN: [id number] DOB: 1933/07/03 Today's Date: 07/24/2019   History of Present Illness  Melanie Diaz is a 86 y.o. female with medical history significant for DVT on anticoagulation, hypertension, diabetes mellitus and depression.  Patient was recently treated for UTI with Bactrim.  Brought to emergency room by family members for evaluation of increased swelling involving her right upper extremity.  She denied any recent trauma.  Denied having any chest pain or shortness of breath. Her oral intake has been poor but she denies having any nausea or vomiting.  Denies having any changes in her bowel habits.  She denies urinary frequency, dysuria or dysuria.  Family notes that she has been extremely weak unable to transfer back and forth to her wheelchair. Labs in the emergency room revealed chronic anemia and worsening of her renal function from baseline (1.75 >> 2.26). She is now admitted for bradycardia secondary to beta blocker, metabolic acidosis, hyponatremia, and AKI on CKD III.  Clinical Impression  Pt admitted with above diagnosis. Pt currently with functional limitations due to the deficits listed below (see PT Problem List). She requires extensive encouragement to participate with physical therapy due to her complaints of feeling fatigued and weak. At baseline she is scoot transfers only from bed to her wheelchair. Pt requires minA+1 from therapist to assist her transferring from supine to sitting at EOB during bed mobility. Unable to scoot toward EOB without heavy assist. Once sitting pt is visibly fatigued. SpO2 remains >90% on 2 L/min O2. Pt reports she is too fatigued and weak to attempt transfer from bed to recliner. She is obviously quite weak and deconditioned. Recommend SNF placement however pt and dtr currently refusing. Daughter plans to take patient to her house at discharge and assures therapy that she has all the  necessary equipment. This seems like a reasonable plan and daughter is aware she will have to provide 24/7 assistance to patient. Pt will benefit from PT services to address deficits in strength, balance, and mobility in order to return to full function at home.    Follow Up Recommendations SNF(Currently refusing, plans to discharge to daughters house)    Equipment Recommendations  None recommended by PT    Recommendations for Other Services       Precautions / Restrictions Precautions Precautions: Fall Restrictions Weight Bearing Restrictions: No      Mobility  Bed Mobility Overal bed mobility: Needs Assistance Bed Mobility: Supine to Sit     Supine to sit: Min assist     General bed mobility comments: Pt requires minA+1 from therapist to assist her transferring from supine to sitting at EOB. Unable to scoot toward EOB without assist. Once sitting pt is visibly fatigued. SpO2 remains >90% on 2 L/min O2. Pt reports she is too fatigued and weak to attempt transfer from bed to recliner  Transfers                 General transfer comment: Pt reports feeling too weak to attempt at this time  Ambulation/Gait             General Gait Details: Transfers only at baseline  Stairs            Wheelchair Mobility    Modified Rankin (Stroke Patients Only)       Balance Overall balance assessment: Needs assistance Sitting-balance support: Bilateral upper extremity supported Sitting balance-Leahy Scale: Fair  Pertinent Vitals/Pain Pain Assessment: No/denies pain    Home Living Family/patient expects to be discharged to:: Private residence Living Arrangements: Alone Available Help at Discharge: Family Type of Home: House Home Access: Ramped entrance     Home Layout: One level Home Equipment: Bedside commode;Shower seat;Grab bars - toilet      Prior Function Level of Independence: Needs assistance    Gait / Transfers Assistance Needed: Pt is transfers only at baseline 2/2 to R AKA  ADL's / Homemaking Assistance Needed: Independent with ADLs, assist for IADLs        Hand Dominance   Dominant Hand: Right    Extremity/Trunk Assessment   Upper Extremity Assessment Upper Extremity Assessment: Generalized weakness    Lower Extremity Assessment Lower Extremity Assessment: Generalized weakness(R AKA)       Communication   Communication: No difficulties  Cognition Arousal/Alertness: Awake/alert Behavior During Therapy: WFL for tasks assessed/performed Overall Cognitive Status: Within Functional Limits for tasks assessed                                        General Comments      Exercises     Assessment/Plan    PT Assessment Patient needs continued PT services  PT Problem List Decreased strength;Decreased activity tolerance;Decreased balance;Decreased mobility       PT Treatment Interventions Functional mobility training;DME instruction;Therapeutic activities;Therapeutic exercise;Balance training;Neuromuscular re-education;Patient/family education;Wheelchair mobility training    PT Goals (Current goals can be found in the Care Plan section)  Acute Rehab PT Goals Patient Stated Goal: Return to living alone PT Goal Formulation: With patient/family Time For Goal Achievement: 08/07/19 Potential to Achieve Goals: Fair    Frequency Min 2X/week   Barriers to discharge Decreased caregiver support Lives alone, plan to stay with daughter at discharge    Co-evaluation               AM-PAC PT "6 Clicks" Mobility  Outcome Measure Help needed turning from your back to your side while in a flat bed without using bedrails?: A Little Help needed moving from lying on your back to sitting on the side of a flat bed without using bedrails?: A Little Help needed moving to and from a bed to a chair (including a wheelchair)?: A Lot Help needed standing  up from a chair using your arms (e.g., wheelchair or bedside chair)?: A Lot Help needed to walk in hospital room?: Total Help needed climbing 3-5 steps with a railing? : Total 6 Click Score: 12    End of Session   Activity Tolerance: Patient limited by fatigue Patient left: in bed;with family/visitor present Nurse Communication: Other (comment)(Need to replace external catheter) PT Visit Diagnosis: Other abnormalities of gait and mobility (R26.89);Muscle weakness (generalized) (M62.81)    Time: 7412-8786 PT Time Calculation (min) (ACUTE ONLY): 25 min   Charges:   PT Evaluation $PT Eval Low Complexity: 1 Low          Melanie Diaz PT, DPT, GCS   Suresh Audi 07/24/2019, 9:33 AM

## 2019-07-24 NOTE — Progress Notes (Addendum)
PROGRESS NOTE    [name]  GGY:[id number] DOB: 1933/06/30 DOA: 07/21/2019 PCP: [name]., MD   Brief Narrative:  Melanie Diaz is a 86 y.o. female with medical history significant for DVT on anticoagulation, hypertension, diabetes mellitus and depression.  Patient was recently treated for UTI with Bactrim.  Brought to emergency room by family members for evaluation of increased swelling involving her right upper extremity. Venous Doppler studies were negative for DVT but she found to have bradycardia. Patient was on metoprolol at home, TSH elevated.  Subjective: Patient was feeling little better after getting blood transfusion yesterday.  Continue to feel weak.  She was accompanied by her daughter in the room.  Assessment & Plan:   Principal Problem:   Bradycardia Active Problems:   DM (diabetes mellitus), secondary (HCC)   Depression   Hypertension   DVT (deep venous thrombosis) (HCC)   Dehydration   Generalized weakness   History of anemia due to chronic kidney disease   Symptomatic bradycardia   PAD (peripheral artery disease) (HCC)   Malnutrition of moderate degree  Bradycardia.  Resolved.  Likely secondary to beta-blocker.  Apparently patient was on 2 separate doses of metoprolol 25 mg twice daily and 50 mg twice daily, not sure what she was taking.  Bradycardia improved after holding beta-blocker. Echocardiogram with grade 1 diastolic dysfunction, without any regional wall motion abnormalities, normal EF.  Mildly elevated BNP at 668.  Patient does not appear volume overload, has CKD. TSH elevated at 7.9 with free T4 within normal limit, free T3 decreased. -Continue holding metoprolol for now. -Cardiology was consulted from ED-appreciate their recommendations, they are recommending restart of metoprolol at 12.5 mg twice daily once heart rate stabilizes in 80s.  None anion gap metabolic acidosis.  Some improvement.  Seems chronic, most likely secondary  to her kidney disease.  Lactic acid within normal limit.  Mild improvement in bicarb today. -Continue bicarb tablets. -Nephrology was consulted-appreciate their recommendations.  Mild hyponatremia.  Sodium at 133.  Hyponatremia labs consistent with hyponatremia secondary to renal failure. -Continue to monitor.  AKI with CKD stage IIIb.  Likely prerenal due to bradycardia and poor p.o. intake.  Be some element of disease progression. -Nephrology consult-appreciate their recommendations. -Renal ultrasound-consistent with chronic renal disease. -Continue to monitor. -Avoid nephrotoxins.  Hypothyroidism.  Patient with elevated TSH, low free T3 and T4 within normal limits are more consistent with hypothyroidism. -Start her on Synthroid 50 MCG daily.  She will need a repeat TSH in 4 weeks.  Generalized weakness.  Be related to her dehydration, bradycardia poor p.o. intake. hypothyroidism  can be contribute to this complaint.  Worsening renal function and anemia can be contributory. -Get PT/OT evaluation-commending SNF placement but patient wants to go back home with her daughter. -Dietitian consult.  Hypertension.  Blood pressure elevated. -Continue home dose of amlodipine and losartan. -Increase hydralazine to 50 mg 3 times a day. -Continue to monitor.  Anemia of chronic disease.  Hemoglobin at 9.4 after 1 unit of PRBC.  No obvious bleeding.  CBC more consistent with anemia of chronic disease.  Anemia panel with iron deficiency. -We will give her IV iron today. -Might need EPO.  Type 2 diabetes.  Well-controlled with A1c of 5.9. -Holding home p.o. meds. -Sensitive sliding scale as needed.  Right upper extremity edema with history of DVT.  Resolved.  Venous Doppler negative for VTE.  Patient is on Coumadin with INR of 2.1 today. Patient's daughter was interested in  starting her on Eliquis instead of Coumadin to avoid monitoring. -Pharmacy consult to start her on Eliquis and discontinue  Coumadin.  PAD.  S/p right BKA. -Continue statin.  Objective: Vitals:   07/24/19 0452 07/24/19 0732 07/24/19 0849 07/24/19 1230  BP: (!) 162/49 (!) 179/50 (!) 179/41 (!) 165/42  Pulse: 70 65  61  Resp: 18 16  18   Temp: 98.1 F (36.7 C) 98 F (36.7 C) 98.6 F (37 C) 98 F (36.7 C)  TempSrc:      SpO2: 98% 94%  98%  Weight: 62.2 kg     Height:        Intake/Output Summary (Last 24 hours) at 07/24/2019 1536 Last data filed at 07/24/2019 0452 Gross per 24 hour  Intake 570 ml  Output 1100 ml  Net -530 ml   Filed Weights   07/22/19 0433 07/23/19 0603 07/24/19 0452  Weight: 57.5 kg 56.6 kg 62.2 kg    Examination:  General exam: Chronically ill-appearing lady, appears calm and comfortable  Respiratory system: Clear to auscultation. Respiratory effort normal. Cardiovascular system: S1 & S2 heard, RRR. No JVD, murmurs, rubs, gallops or clicks. Gastrointestinal system: Soft, nontender, nondistended, bowel sounds positive. Central nervous system: Alert and oriented. No focal neurological deficits.Symmetric 5 x 5 power. Extremities: Right BKA, no edema, no cyanosis, pulses intact and symmetrical. Skin: No rashes, lesions or ulcers Psychiatry: Judgement and insight appear normal.    DVT prophylaxis: Coumadin Code Status: Full Family Communication: Daughter was updated at bedside. Disposition Plan: PT is recommending SNF placement but patient wants to go back at daughter's home. Nephrology is recommending more IV fluids due to AKI.  Most likely can be discharged in 1 to 2 days with maximum home health services according to her needs.  Consultants:   Cardiology  Nephrology  Procedures:  Antimicrobials:   Data Reviewed: I have personally reviewed following labs and imaging studies  CBC: Recent Labs  Lab 07/21/19 1221 07/22/19 0448 07/23/19 0450 07/24/19 0533  WBC 7.0 10.3 8.5 10.1  HGB 8.1* 7.6* 7.2* 9.4*  HCT 24.4* 23.1* 22.9* 28.7*  MCV 85.0 85.2 87.1 86.2   PLT 388 399 357 630   Basic Metabolic Panel: Recent Labs  Lab 07/21/19 1221 07/22/19 0448 07/23/19 0450 07/24/19 0533  NA 130* 131* 132* 133*  K 4.3 4.1 4.4 4.4  CL 103 106 109 108  CO2 17* 16* 18* 18*  GLUCOSE 143* 104* 106* 136*  BUN 39* 37* 36* 38*  CREATININE 2.26* 2.08* 2.11* 2.15*  CALCIUM 8.0* 7.7* 7.6* 7.6*  PHOS  --   --  4.9* 5.2*   GFR: Estimated Creatinine Clearance: 17.2 mL/min (A) (by C-G formula based on SCr of 2.15 mg/dL (H)). Liver Function Tests: Recent Labs  Lab 07/21/19 1221 07/23/19 0450 07/24/19 0533  AST 25  --   --   ALT 27  --   --   ALKPHOS 83  --   --   BILITOT 0.2*  --   --   PROT 5.7*  --   --   ALBUMIN 2.2* 1.7* 1.9*   No results for input(s): LIPASE, AMYLASE in the last 168 hours. No results for input(s): AMMONIA in the last 168 hours. Coagulation Profile: Recent Labs  Lab 07/21/19 1221 07/22/19 0448 07/23/19 0450 07/24/19 0533  INR 1.8* 1.7* 2.2* 2.1*   Cardiac Enzymes: No results for input(s): CKTOTAL, CKMB, CKMBINDEX, TROPONINI in the last 168 hours. BNP (last 3 results) No results for input(s): PROBNP in the  last 8760 hours. HbA1C: Recent Labs    07/21/19 1905  HGBA1C 5.9*   CBG: Recent Labs  Lab 07/23/19 1219 07/23/19 1615 07/23/19 2113 07/24/19 0735 07/24/19 1229  GLUCAP 140* 162* 161* 121* 104*   Lipid Profile: No results for input(s): CHOL, HDL, LDLCALC, TRIG, CHOLHDL, LDLDIRECT in the last 72 hours. Thyroid Function Tests: Recent Labs    07/21/19 1905 07/22/19 0448  FREET4 1.08  --   T3FREE  --  1.6*   Anemia Panel: Recent Labs    07/22/19 1410  VITAMINB12 449  FOLATE 12.0  FERRITIN 31  TIBC 185*  IRON 14*  RETICCTPCT 1.7   Sepsis Labs: Recent Labs  Lab 07/22/19 0918 07/22/19 1157  LATICACIDVEN 0.5 0.7    Recent Results (from the past 240 hour(s))  SARS CORONAVIRUS 2 (TAT 6-24 HRS) Nasopharyngeal     Status: None   Collection Time: 07/21/19  2:34 PM   Specimen: Nasopharyngeal   Result Value Ref Range Status   SARS Coronavirus 2 NEGATIVE NEGATIVE Final    Comment: (NOTE) SARS-CoV-2 target nucleic acids are NOT DETECTED. The SARS-CoV-2 RNA is generally detectable in upper and lower respiratory specimens during the acute phase of infection. Negative results do not preclude SARS-CoV-2 infection, do not rule out co-infections with other pathogens, and should not be used as the sole basis for treatment or other patient management decisions. Negative results must be combined with clinical observations, patient history, and epidemiological information. The expected result is Negative. Fact Sheet for Patients: SugarRoll.be Fact Sheet for Healthcare Providers: https://www.woods-mathews.com/ This test is not yet approved or cleared by the [place] FDA and  has been authorized for detection and/or diagnosis of SARS-CoV-2 by FDA under an Emergency Use Authorization (EUA). This EUA will remain  in effect (meaning this test can be used) for the duration of the COVID-19 declaration under Section 56 4(b)(1) of the Act, 21 U.S.C. section 360bbb-3(b)(1), unless the authorization is terminated or revoked sooner. Performed at [facility], [place] [address] [place], [place] [address]      Radiology Studies: No results found.  Scheduled Meds: . amLODipine  10 mg Oral Daily  . atorvastatin  40 mg Oral q1800  . hydrALAZINE  50 mg Oral Q8H  . insulin aspart  0-15 Units Subcutaneous TID WC  . insulin aspart  4 Units Subcutaneous TID WC  . levothyroxine  50 mcg Oral Q0600  . losartan  50 mg Oral Q24H  . mirtazapine  30 mg Oral QHS  . multivitamin with minerals  1 tablet Oral Daily  . sodium bicarbonate  650 mg Oral TID  . sodium chloride flush  3 mL Intravenous Once  . warfarin  2 mg Oral Daily  . Warfarin - Physician Dosing Inpatient   Does not apply q1800   Continuous Infusions: . sodium chloride 40 mL/hr at 07/24/19  1336     LOS: 3 days   Time spent: 40 minutes.   Lorella Nimrod, MD [facility]  If 7PM-7AM, please contact night-coverage [email or web address]  07/24/2019, 3:36 PM   This record has been created using Systems analyst. Errors have been sought and corrected,but may not always be located. Such creation errors do not reflect on the standard of care.

## 2019-07-25 ENCOUNTER — Inpatient Hospital Stay: Payer: Medicare Other

## 2019-07-25 LAB — CBC
HCT: 28.8 % — ABNORMAL LOW (ref 36.0–46.0)
Hemoglobin: 9.2 g/dL — ABNORMAL LOW (ref 12.0–15.0)
MCH: 28 pg (ref 26.0–34.0)
MCHC: 31.9 g/dL (ref 30.0–36.0)
MCV: 87.8 fL (ref 80.0–100.0)
Platelets: 382 10*3/uL (ref 150–400)
RBC: 3.28 MIL/uL — ABNORMAL LOW (ref 3.87–5.11)
RDW: 15 % (ref 11.5–15.5)
WBC: 9 10*3/uL (ref 4.0–10.5)
nRBC: 0 % (ref 0.0–0.2)

## 2019-07-25 LAB — RENAL FUNCTION PANEL
Albumin: 1.9 g/dL — ABNORMAL LOW (ref 3.5–5.0)
Anion gap: 7 (ref 5–15)
BUN: 37 mg/dL — ABNORMAL HIGH (ref 8–23)
CO2: 19 mmol/L — ABNORMAL LOW (ref 22–32)
Calcium: 7.4 mg/dL — ABNORMAL LOW (ref 8.9–10.3)
Chloride: 106 mmol/L (ref 98–111)
Creatinine, Ser: 2.06 mg/dL — ABNORMAL HIGH (ref 0.44–1.00)
GFR calc Af Amer: 25 mL/min — ABNORMAL LOW (ref >60)
GFR calc non Af Amer: 22 mL/min — ABNORMAL LOW (ref >60)
Glucose, Bld: 142 mg/dL — ABNORMAL HIGH (ref 70–99)
Phosphorus: 4.9 mg/dL — ABNORMAL HIGH (ref 2.5–4.6)
Potassium: 4.5 mmol/L (ref 3.5–5.1)
Sodium: 132 mmol/L — ABNORMAL LOW (ref 135–145)

## 2019-07-25 LAB — PARATHYROID HORMONE, INTACT (NO CA): PTH: 52 pg/mL (ref 15–65)

## 2019-07-25 LAB — GLUCOSE, CAPILLARY
Glucose-Capillary: 134 mg/dL — ABNORMAL HIGH (ref 70–99)
Glucose-Capillary: 120 mg/dL — ABNORMAL HIGH (ref 70–99)
Glucose-Capillary: 120 mg/dL — ABNORMAL HIGH (ref 70–99)
Glucose-Capillary: 102 mg/dL — ABNORMAL HIGH (ref 70–99)
Glucose-Capillary: 103 mg/dL — ABNORMAL HIGH (ref 70–99)

## 2019-07-25 LAB — PROTIME-INR
INR: 1.9 — ABNORMAL HIGH (ref 0.8–1.2)
Prothrombin Time: 21.8 seconds — ABNORMAL HIGH (ref 11.4–15.2)

## 2019-07-25 LAB — C3 COMPLEMENT: C3 Complement: 74 mg/dL — ABNORMAL LOW (ref 82–167)

## 2019-07-25 LAB — ANA W/REFLEX IF POSITIVE: Anti Nuclear Antibody (ANA): NEGATIVE

## 2019-07-25 LAB — C4 COMPLEMENT: Complement C4, Body Fluid: 18 mg/dL (ref 12–38)

## 2019-07-25 MED ORDER — LOSARTAN POTASSIUM 50 MG PO TABS
100.0000 mg | ORAL_TABLET | ORAL | Status: DC
  Administered 2019-07-25 – 2019-07-28 (×3): 100 mg via ORAL
  Filled 2019-07-25 (×3): qty 2

## 2019-07-25 MED ORDER — SODIUM CHLORIDE 0.9 % IV BOLUS
250.0000 mL | Freq: Once | INTRAVENOUS | Status: AC
  Administered 2019-07-25: 250 mL via INTRAVENOUS

## 2019-07-25 MED ORDER — APIXABAN 2.5 MG PO TABS
2.5000 mg | ORAL_TABLET | Freq: Two times a day (BID) | ORAL | Status: DC
  Administered 2019-07-25 – 2019-07-27 (×5): 2.5 mg via ORAL
  Filled 2019-07-25 (×5): qty 1

## 2019-07-25 MED ORDER — BISACODYL 10 MG RE SUPP
10.0000 mg | Freq: Every day | RECTAL | Status: DC | PRN
  Administered 2019-07-25: 10 mg via RECTAL
  Filled 2019-07-25: qty 1

## 2019-07-25 NOTE — Progress Notes (Signed)
PROGRESS NOTE    Melanie Diaz  GTX:[id number] DOB: 29-Aug-1933 DOA: 07/21/2019 PCP: [name]., MD   Brief Narrative:  Melanie Diaz is a 86 y.o. female with medical history significant for DVT on anticoagulation, hypertension, diabetes mellitus and depression.  Patient was recently treated for UTI with Bactrim.  Brought to emergency room by family members for evaluation of increased swelling involving her right upper extremity. Venous Doppler studies were negative for DVT but she found to have bradycardia. Patient was on metoprolol at home, TSH elevated.  Subjective: Patient was feeling the same when seen today.  Generalized malaise and no new symptoms.  She does not want to go to SNF.  Assessment & Plan:   Principal Problem:   Bradycardia Active Problems:   DM (diabetes mellitus), secondary (HCC)   Depression   Hypertension   DVT (deep venous thrombosis) (HCC)   Dehydration   Generalized weakness   History of anemia due to chronic kidney disease   Symptomatic bradycardia   PAD (peripheral artery disease) (HCC)   Malnutrition of moderate degree  Bradycardia.  Resolved.  Likely secondary to beta-blocker.  Apparently patient was on 2 separate doses of metoprolol 25 mg twice daily and 50 mg twice daily, not sure what she was taking.  Bradycardia improved after holding beta-blocker. Echocardiogram with grade 1 diastolic dysfunction, without any regional wall motion abnormalities, normal EF.  Mildly elevated BNP at 668.  Patient does not appear volume overload, has CKD. TSH elevated at 7.9 with free T4 within normal limit, free T3 decreased. -Continue holding metoprolol for now. -Cardiology was consulted from ED-appreciate their recommendations, they are recommending restart of metoprolol at 12.5 mg twice daily once heart rate stabilizes in 80s.  None anion gap metabolic acidosis.  Some improvement.  Seems chronic, most likely secondary to her kidney disease.  Lactic  acid within normal limit.  -Continue bicarb tablets. -Nephrology was consulted-appreciate their recommendations.  Mild hyponatremia.  Sodium at 132.  Hyponatremia labs consistent with hyponatremia secondary to renal failure. -Continue to monitor.  AKI with CKD stage IIIb.  Likely prerenal due to bradycardia and poor p.o. intake.  Be some element of disease progression.  Mild improvement in creatinine with gentle hydration. -Nephrology consult-appreciate their recommendations. -Renal ultrasound-consistent with chronic renal disease. -Getting renal venous Doppler studies. -Continue to monitor. -Avoid nephrotoxins. -Continue gentle IV hydration.  Hypothyroidism.  Patient with elevated TSH, low free T3 and T4 within normal limits are more consistent with hypothyroidism. -Start her on Synthroid 50 MCG daily.  She will need a repeat TSH in 4 weeks.  Generalized weakness.  Be related to her dehydration, bradycardia poor p.o. intake. hypothyroidism  can be contribute to this complaint.  Worsening renal function and anemia can be contributory. -Get PT/OT evaluation-commending SNF placement but patient wants to go back home with her daughter.  Daughter is getting her home ready, should be able to discharge tomorrow. -Dietitian consult.  Hypertension.  Blood pressure remained elevated. -Continue home dose of amlodipine  -Continue hydralazine to 50 mg 3 times a day. -Increase losartan 200 mg daily. -Continue to monitor.  Anemia of chronic disease.  Hemoglobin at 9.2 today after 1 unit of PRBC.  No obvious bleeding.  CBC more consistent with anemia of chronic disease.  Anemia panel with iron deficiency.  She did receive IV iron yesterday. -Her on p.o. iron supplement. -Might need EPO.  Type 2 diabetes.  Well-controlled with A1c of 5.9. -Holding home p.o. meds. -Sensitive sliding  scale as needed.  Right upper extremity edema with history of DVT.  Resolved.  Venous Doppler negative for VTE.   She was on Coumadin which was converted to Eliquis yesterday at their request. -Continue Eliquis 2.5 mg twice daily per pharmacy.  PAD.  S/p right BKA. -Continue statin.  Objective: Vitals:   07/25/19 0514 07/25/19 0847 07/25/19 1234 07/25/19 1234  BP: (!) 167/49 (!) 163/50 (!) 171/46 (!) 171/46  Pulse: 75 72  74  Resp: 16 18  18   Temp: 97.7 F (36.5 C) 98.6 F (37 C)  98.6 F (37 C)  TempSrc: Oral   Oral  SpO2: 97% 96%  97%  Weight:      Height:        Intake/Output Summary (Last 24 hours) at 07/25/2019 1254 Last data filed at 07/24/2019 1800 Gross per 24 hour  Intake 174.93 ml  Output 400 ml  Net -225.07 ml   Filed Weights   07/22/19 0433 07/23/19 0603 07/24/19 0452  Weight: 57.5 kg 56.6 kg 62.2 kg    Examination:  General exam: Chronically ill-appearing lady, appears calm and comfortable  Respiratory system: Clear to auscultation. Respiratory effort normal. Cardiovascular system: S1 & S2 heard, RRR. No JVD, murmurs, rubs, gallops or clicks. Gastrointestinal system: Soft, nontender, nondistended, bowel sounds positive. Central nervous system: Alert and oriented. No focal neurological deficits.Symmetric 5 x 5 power. Extremities: Right BKA, no edema, no cyanosis, pulses intact and symmetrical. Skin: No rashes, lesions or ulcers Psychiatry: Judgement and insight appear normal.    DVT prophylaxis: Eliquis Code Status: Full Family Communication: Daughter was updated on phone. Disposition Plan: PT is recommending SNF placement but patient wants to go back at daughter's home.  Nephrology wants to continue gentle IV fluid and getting  Doppler studies of renal arteries.  Daughter is making her home ready for a possible discharge tomorrow.  Consultants:   Cardiology  Nephrology  Procedures:  Antimicrobials:   Data Reviewed: I have personally reviewed following labs and imaging studies  CBC: Recent Labs  Lab 07/21/19 1221 07/22/19 0448 07/23/19 0450  07/24/19 0533 07/25/19 0606  WBC 7.0 10.3 8.5 10.1 9.0  HGB 8.1* 7.6* 7.2* 9.4* 9.2*  HCT 24.4* 23.1* 22.9* 28.7* 28.8*  MCV 85.0 85.2 87.1 86.2 87.8  PLT 388 399 357 379 062   Basic Metabolic Panel: Recent Labs  Lab 07/21/19 1221 07/22/19 0448 07/23/19 0450 07/24/19 0533 07/25/19 0606  NA 130* 131* 132* 133* 132*  K 4.3 4.1 4.4 4.4 4.5  CL 103 106 109 108 106  CO2 17* 16* 18* 18* 19*  GLUCOSE 143* 104* 106* 136* 142*  BUN 39* 37* 36* 38* 37*  CREATININE 2.26* 2.08* 2.11* 2.15* 2.06*  CALCIUM 8.0* 7.7* 7.6* 7.6* 7.4*  PHOS  --   --  4.9* 5.2* 4.9*   GFR: Estimated Creatinine Clearance: 17.9 mL/min (A) (by C-G formula based on SCr of 2.06 mg/dL (H)). Liver Function Tests: Recent Labs  Lab 07/21/19 1221 07/23/19 0450 07/24/19 0533 07/25/19 0606  AST 25  --   --   --   ALT 27  --   --   --   ALKPHOS 83  --   --   --   BILITOT 0.2*  --   --   --   PROT 5.7*  --   --   --   ALBUMIN 2.2* 1.7* 1.9* 1.9*   No results for input(s): LIPASE, AMYLASE in the last 168 hours. No results for input(s): AMMONIA  in the last 168 hours. Coagulation Profile: Recent Labs  Lab 07/21/19 1221 07/22/19 0448 07/23/19 0450 07/24/19 0533 07/25/19 0606  INR 1.8* 1.7* 2.2* 2.1* 1.9*   Cardiac Enzymes: No results for input(s): CKTOTAL, CKMB, CKMBINDEX, TROPONINI in the last 168 hours. BNP (last 3 results) No results for input(s): PROBNP in the last 8760 hours. HbA1C: No results for input(s): HGBA1C in the last 72 hours. CBG: Recent Labs  Lab 07/24/19 1229 07/24/19 1659 07/24/19 2144 07/25/19 0845 07/25/19 1236  GLUCAP 104* 77 115* 134* 120*   Lipid Profile: No results for input(s): CHOL, HDL, LDLCALC, TRIG, CHOLHDL, LDLDIRECT in the last 72 hours. Thyroid Function Tests: No results for input(s): TSH, T4TOTAL, FREET4, T3FREE, THYROIDAB in the last 72 hours. Anemia Panel: Recent Labs    07/22/19 1410  VITAMINB12 449  FOLATE 12.0  FERRITIN 31  TIBC 185*  IRON 14*   RETICCTPCT 1.7   Sepsis Labs: Recent Labs  Lab 07/22/19 0918 07/22/19 1157  LATICACIDVEN 0.5 0.7    Recent Results (from the past 240 hour(s))  SARS CORONAVIRUS 2 (TAT 6-24 HRS) Nasopharyngeal     Status: None   Collection Time: 07/21/19  2:34 PM   Specimen: Nasopharyngeal  Result Value Ref Range Status   SARS Coronavirus 2 NEGATIVE NEGATIVE Final    Comment: (NOTE) SARS-CoV-2 target nucleic acids are NOT DETECTED. The SARS-CoV-2 RNA is generally detectable in upper and lower respiratory specimens during the acute phase of infection. Negative results do not preclude SARS-CoV-2 infection, do not rule out co-infections with other pathogens, and should not be used as the sole basis for treatment or other patient management decisions. Negative results must be combined with clinical observations, patient history, and epidemiological information. The expected result is Negative. Fact Sheet for Patients: SugarRoll.be Fact Sheet for Healthcare Providers: https://www.woods-mathews.com/ This test is not yet approved or cleared by the [place] FDA and  has been authorized for detection and/or diagnosis of SARS-CoV-2 by FDA under an Emergency Use Authorization (EUA). This EUA will remain  in effect (meaning this test can be used) for the duration of the COVID-19 declaration under Section 56 4(b)(1) of the Act, 21 U.S.C. section 360bbb-3(b)(1), unless the authorization is terminated or revoked sooner. Performed at [facility], [place] [address] [place], [place] [address]      Radiology Studies: No results found.  Scheduled Meds: . amLODipine  10 mg Oral Daily  . apixaban  2.5 mg Oral BID  . atorvastatin  40 mg Oral q1800  . hydrALAZINE  50 mg Oral Q8H  . insulin aspart  0-15 Units Subcutaneous TID WC  . insulin aspart  4 Units Subcutaneous TID WC  . levothyroxine  50 mcg Oral Q0600  . losartan  100 mg Oral Q24H  .  mirtazapine  30 mg Oral QHS  . multivitamin with minerals  1 tablet Oral Daily  . sodium bicarbonate  650 mg Oral TID  . sodium chloride flush  3 mL Intravenous Once   Continuous Infusions: . sodium chloride 40 mL/hr at 07/24/19 1800     LOS: 4 days   Time spent: 40 minutes.   Lorella Nimrod, MD [facility]  If 7PM-7AM, please contact night-coverage [email or web address]  07/25/2019, 12:54 PM   This record has been created using Systems analyst. Errors have been sought and corrected,but may not always be located. Such creation errors do not reflect on the standard of care.

## 2019-07-25 NOTE — Consult Note (Signed)
[place] for Apixaban Indication: Hx of DVT  Patient Measurements: Height: 5\' 2"  (157.5 cm) Weight: 137 lb 2 oz (62.2 kg) IBW/kg (Calculated) : 50.1  Vital Signs: Temp: 97.7 F (36.5 C) (03/13 0514) Temp Source: Oral (03/13 0514) BP: 167/49 (03/13 0514) Pulse Rate: 75 (03/13 0514)  Labs: Recent Labs    07/23/19 0450 07/24/19 0533 07/25/19 0606  HGB 7.2* 9.4*  --   HCT 22.9* 28.7*  --   PLT 357 379  --   LABPROT 24.2* 23.6* 21.8*  INR 2.2* 2.1* 1.9*  CREATININE 2.11* 2.15*  --     Estimated Creatinine Clearance: 17.2 mL/min (A) (by C-G formula based on SCr of 2.15 mg/dL (H)).   Medical History: Past Medical History:  Diagnosis Date  . Depression   . Diabetes mellitus without complication ([place])   . History of deep vein thrombosis    about 30 years ago  . Hyperlipidemia 07/06/2005  . Hypertension     Medications:  Transitioning warfarin to apixaban per patient preference. Will need to confirm patient is able to afford apixaban.  Assessment: Patient is an 86 y/o F with a history of DVT on chronic anticoagulation with warfarin who is admitted for symptomatic bradycardia. Pharmacy has been consulted to convert warfarin to apixaban.  Her last hemoglobin was 9.4 after 1 unit of PRBC.  No bleeding noted  Goal of Therapy:  INR 2-3 Monitor platelets by anticoagulation protocol: Yes   Plan:   Warfarin has been stopped: last dose 03/11; INR now 1.9 this morning  start apixaban 2.5 mg twice daily   CBC per protocol  Dallie Piles  07/25/2019,8:38 AM

## 2019-07-25 NOTE — Progress Notes (Signed)
SUBJECTIVE: Unable to assess the patient as she is currently off the floor getting an [place] of her renal arteries. Per the nurse, she had no events overnight. HR primarily staying in the 70s with mildly better BP control with systolics staying in the 161W.   Vitals:   07/24/19 1954 07/25/19 0009 07/25/19 0514 07/25/19 0847  BP: (!) 169/50 (!) 170/48 (!) 167/49 (!) 163/50  Pulse: 72 77 75 72  Resp: 18 18 16 18   Temp: 98.3 F (36.8 C) 98.5 F (36.9 C) 97.7 F (36.5 C) 98.6 F (37 C)  TempSrc: Oral Oral Oral   SpO2: 98% 97% 97% 96%  Weight:      Height:        Intake/Output Summary (Last 24 hours) at 07/25/2019 1056 Last data filed at 07/24/2019 1800 Gross per 24 hour  Intake 174.93 ml  Output 400 ml  Net -225.07 ml    LABS: Basic Metabolic Panel: Recent Labs    07/24/19 0533 07/25/19 0606  NA 133* 132*  K 4.4 4.5  CL 108 106  CO2 18* 19*  GLUCOSE 136* 142*  BUN 38* 37*  CREATININE 2.15* 2.06*  CALCIUM 7.6* 7.4*  PHOS 5.2* 4.9*   Liver Function Tests: Recent Labs    07/24/19 0533 07/25/19 0606  ALBUMIN 1.9* 1.9*   No results for input(s): LIPASE, AMYLASE in the last 72 hours. CBC: Recent Labs    07/24/19 0533 07/25/19 0606  WBC 10.1 9.0  HGB 9.4* 9.2*  HCT 28.7* 28.8*  MCV 86.2 87.8  PLT 379 382   Cardiac Enzymes: No results for input(s): CKTOTAL, CKMB, CKMBINDEX, TROPONINI in the last 72 hours. BNP: Invalid input(s): POCBNP D-Dimer: No results for input(s): DDIMER in the last 72 hours. Hemoglobin A1C: No results for input(s): HGBA1C in the last 72 hours. Fasting Lipid Panel: No results for input(s): CHOL, HDL, LDLCALC, TRIG, CHOLHDL, LDLDIRECT in the last 72 hours. Thyroid Function Tests: No results for input(s): TSH, T4TOTAL, T3FREE, THYROIDAB in the last 72 hours.  Invalid input(s): FREET3 Anemia Panel: Recent Labs    07/22/19 1410  VITAMINB12 449  FOLATE 12.0  FERRITIN 31  TIBC 185*  IRON 14*  RETICCTPCT 1.7     PHYSICAL  EXAM Unable to perform as patient is off the unit.  TELEMETRY: NSR 70s  ASSESSMENT AND PLAN: Bradycardia further resolved. Patient remaining in the 70s overnight. Patient remains hypertensive with wide pulse pressures. Per nephrology, patient was started on maintenance fluid 32mL/hr to help further resolve her AKI. In the setting of improving kidney function back towards baseline and remaining hypertensive, the plan would be to increase her losartan to 100mg  daily. Will not start any diuretics at this time as her AKI appears to be prerenal. Please continue maintenance fluid and continue to push hydration for the patient. In the setting of no evidence of HF and euvolemia, will also order 290mL 0.9% NS bolus. If Kidney fx worsens again with the increase in her ARB, will lessen back to 50mg  daily. Continue to hold beta blockers. If patient continues to maintain in the 70s throughout the day, most likely add metoprolol succinate 12.5mg  back on tomorrow. Will continue to have systolic goals of <960 as patient continues to have a wide pulse pressure. Will continue to follow.  Principal Problem:   Bradycardia Active Problems:   DM (diabetes mellitus), secondary (HCC)   Depression   Hypertension   DVT (deep venous thrombosis) (HCC)   Dehydration   Generalized weakness  History of anemia due to chronic kidney disease   Symptomatic bradycardia   PAD (peripheral artery disease) ([place])   Malnutrition of moderate degree    Adaline Sill, NP-C 07/25/2019 10:56 AM

## 2019-07-25 NOTE — TOC Transition Note (Signed)
Transition of Care [facility]) - CM/SW Discharge Note   Patient Details  Name: Melanie Diaz MRN: [id number] Date of Birth: Feb 06, 1934  [facility]) CM/SW Contact:  Marshell Garfinkel, RN Phone Number: 07/25/2019, 11:52 AM   Clinical Narrative:    RNCM trying to find home health agency that will accept patient at daughter's address [place]. O2 is acute and request IS. Patient is wheelchair bound at baseline but was living independently. Daughter is arranging for patient to come to her house at discharge.    Final next level of care: [place] Barriers to Discharge: Inadequate or no insurance   Patient Goals and CMS Choice Patient states their goals for this hospitalization and ongoing recovery are:: "Plan to go to my daughter's at discharge" CMS Medicare.gov Compare Post Acute Care list provided to:: Patient Choice offered to / list presented to : Patient, Adult Children(Angie)  Discharge Placement                       Discharge Plan and Services                                     Social Determinants of Health (SDOH) Interventions     Readmission Risk Interventions No flowsheet data found.

## 2019-07-25 NOTE — Progress Notes (Signed)
[facility]  ROUNDING NOTE   Subjective:   UOP 448m.  Creatinine 2.06 (2.15)  NS at 420mhr  Objective:  Vital signs in last 24 hours:  Temp:  [97.7 F (36.5 C)-98.6 F (37 C)] 98.6 F (37 C) (03/13 1234) Pulse Rate:  [63-77] 74 (03/13 1234) Resp:  [16-18] 18 (03/13 1234) BP: (163-171)/(46-50) 171/46 (03/13 1234) SpO2:  [95 %-98 %] 97 % (03/13 1234)  Weight change:  Filed Weights   07/22/19 0433 07/23/19 0603 07/24/19 0452  Weight: 57.5 kg 56.6 kg 62.2 kg    Intake/Output: I/O last 3 completed shifts: In: 174.9 [I.V.:174.9] Out: 1500 [Urine:1500]   Intake/Output this shift:  No intake/output data recorded.  Physical Exam: General: NAD, laying in bed  Head: Normocephalic, atraumatic. Moist oral mucosal membranes  Eyes: Anicteric, PERRL  Neck: Supple, trachea midline  Lungs:  Clear to auscultation  Heart: Regular rate and rhythm  Abdomen:  Soft, nontender,   Extremities: + peripheral edema.  Neurologic: Nonfocal, moving all four extremities  Skin: No lesions        Basic Metabolic Panel: Recent Labs  Lab 07/21/19 1221 07/21/19 1221 07/22/19 0448 07/22/19 0448 07/23/19 0450 07/24/19 0533 07/25/19 0606  NA 130*  --  131*  --  132* 133* 132*  K 4.3  --  4.1  --  4.4 4.4 4.5  CL 103  --  106  --  109 108 106  CO2 17*  --  16*  --  18* 18* 19*  GLUCOSE 143*  --  104*  --  106* 136* 142*  BUN 39*  --  37*  --  36* 38* 37*  CREATININE 2.26*  --  2.08*  --  2.11* 2.15* 2.06*  CALCIUM 8.0*   < > 7.7*   < > 7.6* 7.6* 7.4*  PHOS  --   --   --   --  4.9* 5.2* 4.9*   < > = values in this interval not displayed.    Liver Function Tests: Recent Labs  Lab 07/21/19 1221 07/23/19 0450 07/24/19 0533 07/25/19 0606  AST 25  --   --   --   ALT 27  --   --   --   ALKPHOS 83  --   --   --   BILITOT 0.2*  --   --   --   PROT 5.7*  --   --   --   ALBUMIN 2.2* 1.7* 1.9* 1.9*   No results for input(s): LIPASE, AMYLASE in the last 168 hours. No  results for input(s): AMMONIA in the last 168 hours.  CBC: Recent Labs  Lab 07/21/19 1221 07/22/19 0448 07/23/19 0450 07/24/19 0533 07/25/19 0606  WBC 7.0 10.3 8.5 10.1 9.0  HGB 8.1* 7.6* 7.2* 9.4* 9.2*  HCT 24.4* 23.1* 22.9* 28.7* 28.8*  MCV 85.0 85.2 87.1 86.2 87.8  PLT 388 399 357 379 382    Cardiac Enzymes: No results for input(s): CKTOTAL, CKMB, CKMBINDEX, TROPONINI in the last 168 hours.  BNP: Invalid input(s): POCBNP  CBG: Recent Labs  Lab 07/24/19 1229 07/24/19 1659 07/24/19 2144 07/25/19 0845 07/25/19 1236  GLUCAP 104* 77 115* 134* 120*    Microbiology: Results for orders placed or performed during the hospital encounter of 07/21/19  SARS CORONAVIRUS 2 (TAT 6-24 HRS) Nasopharyngeal     Status: None   Collection Time: 07/21/19  2:34 PM   Specimen: Nasopharyngeal  Result Value Ref Range Status   SARS Coronavirus 2 NEGATIVE  NEGATIVE Final    Comment: (NOTE) SARS-CoV-2 target nucleic acids are NOT DETECTED. The SARS-CoV-2 RNA is generally detectable in upper and lower respiratory specimens during the acute phase of infection. Negative results do not preclude SARS-CoV-2 infection, do not rule out co-infections with other pathogens, and should not be used as the sole basis for treatment or other patient management decisions. Negative results must be combined with clinical observations, patient history, and epidemiological information. The expected result is Negative. Fact Sheet for Patients: SugarRoll.be Fact Sheet for Healthcare Providers: https://www.woods-mathews.com/ This test is not yet approved or cleared by the [place] FDA and  has been authorized for detection and/or diagnosis of SARS-CoV-2 by FDA under an Emergency Use Authorization (EUA). This EUA will remain  in effect (meaning this test can be used) for the duration of the COVID-19 declaration under Section 56 4(b)(1) of the Act, 21 U.S.C. section  360bbb-3(b)(1), unless the authorization is terminated or revoked sooner. Performed at [facility], [place] [address] [place], [place] [address]     Coagulation Studies: Recent Labs    07/23/19 0450 07/24/19 0533 07/25/19 0606  LABPROT 24.2* 23.6* 21.8*  INR 2.2* 2.1* 1.9*    Urinalysis: No results for input(s): COLORURINE, LABSPEC, PHURINE, GLUCOSEU, HGBUR, BILIRUBINUR, KETONESUR, PROTEINUR, UROBILINOGEN, NITRITE, LEUKOCYTESUR in the last 72 hours.  Invalid input(s): APPERANCEUR    Imaging: No results found.   Medications:   . sodium chloride 40 mL/hr at 07/24/19 1800   . amLODipine  10 mg Oral Daily  . apixaban  2.5 mg Oral BID  . atorvastatin  40 mg Oral q1800  . hydrALAZINE  50 mg Oral Q8H  . insulin aspart  0-15 Units Subcutaneous TID WC  . insulin aspart  4 Units Subcutaneous TID WC  . levothyroxine  50 mcg Oral Q0600  . losartan  100 mg Oral Q24H  . mirtazapine  30 mg Oral QHS  . multivitamin with minerals  1 tablet Oral Daily  . sodium bicarbonate  650 mg Oral TID  . sodium chloride flush  3 mL Intravenous Once   acetaminophen **OR** acetaminophen, hydrALAZINE, ondansetron **OR** ondansetron (ZOFRAN) IV  Assessment/ Plan:  [name] is a 86 y.o. white female with diabetes mellitus type 2, hyperlipidemia, hypertension, chronic kidney disease stage IV baseline creatinine 1.75 with EGFR 27, who was admitted to [facility] 07/21/2019 for Bradycardia [R00.1] Weakness [R53.1] Arm swelling [M79.89] Symptomatic bradycardia [R00.1] AKI (acute kidney injury) ([place]) [N17.9]  1.  Acute kidney injury with metabolic acidosis on chronic kidney disease stage IV with proteinuria Baseline creatinine 1.75, GFR of 27 on 07/15/19.  Acute renal secondary to bactrim, acute prerenal azotemia Chronic kidney disease secondary to diabetic nephropathy - Continue gentle IV fluids.   2.  Anemia of chronic kidney disease.  Hemoglobin 9.2  3. Hyponatremia: secondary to  renal failure. Na 132.     LOS: 4 Qunicy Higinbotham 3/13/202112:58 PM

## 2019-07-26 LAB — RENAL FUNCTION PANEL
Albumin: 1.9 g/dL — ABNORMAL LOW (ref 3.5–5.0)
Anion gap: 6 (ref 5–15)
BUN: 38 mg/dL — ABNORMAL HIGH (ref 8–23)
CO2: 21 mmol/L — ABNORMAL LOW (ref 22–32)
Calcium: 7.6 mg/dL — ABNORMAL LOW (ref 8.9–10.3)
Chloride: 108 mmol/L (ref 98–111)
Creatinine, Ser: 2 mg/dL — ABNORMAL HIGH (ref 0.44–1.00)
GFR calc Af Amer: 26 mL/min — ABNORMAL LOW (ref >60)
GFR calc non Af Amer: 23 mL/min — ABNORMAL LOW (ref >60)
Glucose, Bld: 99 mg/dL (ref 70–99)
Phosphorus: 4.3 mg/dL (ref 2.5–4.6)
Potassium: 4.6 mmol/L (ref 3.5–5.1)
Sodium: 135 mmol/L (ref 135–145)

## 2019-07-26 LAB — GLUCOSE, CAPILLARY
Glucose-Capillary: 97 mg/dL (ref 70–99)
Glucose-Capillary: 85 mg/dL (ref 70–99)
Glucose-Capillary: 130 mg/dL — ABNORMAL HIGH (ref 70–99)
Glucose-Capillary: 117 mg/dL — ABNORMAL HIGH (ref 70–99)
Glucose-Capillary: 151 mg/dL — ABNORMAL HIGH (ref 70–99)

## 2019-07-26 LAB — CBC
HCT: 27.4 % — ABNORMAL LOW (ref 36.0–46.0)
Hemoglobin: 8.9 g/dL — ABNORMAL LOW (ref 12.0–15.0)
MCH: 28.3 pg (ref 26.0–34.0)
MCHC: 32.5 g/dL (ref 30.0–36.0)
MCV: 87 fL (ref 80.0–100.0)
Platelets: 378 10*3/uL (ref 150–400)
RBC: 3.15 MIL/uL — ABNORMAL LOW (ref 3.87–5.11)
RDW: 14.8 % (ref 11.5–15.5)
WBC: 9 10*3/uL (ref 4.0–10.5)
nRBC: 0 % (ref 0.0–0.2)

## 2019-07-26 LAB — MPO/PR-3 (ANCA) ANTIBODIES
ANCA Proteinase 3: <3.5 U/mL (ref 0.0–3.5)
Myeloperoxidase Abs: <9 U/mL (ref 0.0–9.0)

## 2019-07-26 MED ORDER — ALBUMIN HUMAN 25 % IV SOLN
25.0000 g | Freq: Once | INTRAVENOUS | Status: AC
  Administered 2019-07-26: 25 g via INTRAVENOUS
  Filled 2019-07-26: qty 100

## 2019-07-26 MED ORDER — SODIUM BICARBONATE-DEXTROSE 150-5 MEQ/L-% IV SOLN
150.0000 meq | INTRAVENOUS | Status: DC
  Administered 2019-07-26: 150 meq via INTRAVENOUS
  Filled 2019-07-26 (×4): qty 1000

## 2019-07-26 NOTE — Progress Notes (Addendum)
Patient's daughter called out- patient not feeling well. Feel like "she is about to bust".    Assessed patient -  Lung sounds clear, BP did drop to 136/94 from 156/49.     Difficult to understand how she feels-   Pt states she feel like she does when she gets very sick (Vomiting and diarrhea), but it comes in episodes and does not last.     Patient started complaining about chest pressure not long after episode.   EKG done.    [facility] aware of episode-  EKG WNL; will continue to monitor.     Daughter asked if patient can be seen by GI while here.   Per MD- will consult in AM

## 2019-07-26 NOTE — Progress Notes (Signed)
[facility]  ROUNDING NOTE   Subjective:   UOP 930m.  Creatinine 2 (2.06) (2.15)  NS at 462mhr  Patient feels weak and tired.   Objective:  Vital signs in last 24 hours:  Temp:  [98.3 F (36.8 C)-98.7 F (37.1 C)] 98.7 F (37.1 C) (03/14 0759) Pulse Rate:  [67-77] 77 (03/14 0759) Resp:  [16-18] 17 (03/14 0759) BP: (161-173)/(43-52) 171/43 (03/14 0759) SpO2:  [96 %-98 %] 98 % (03/14 0759) Weight:  [64.5 kg] 64.5 kg (03/14 0407)  Weight change:  Filed Weights   07/23/19 0603 07/24/19 0452 07/26/19 0407  Weight: 56.6 kg 62.2 kg 64.5 kg    Intake/Output: I/O last 3 completed shifts: In: 1443.4 [P.O.:540; I.V.:653.4; IV Piggyback:250] Out: 950 [Urine:950]   Intake/Output this shift:  No intake/output data recorded.  Physical Exam: General: NAD, laying in bed  Head: Normocephalic, atraumatic. Moist oral mucosal membranes  Eyes: Anicteric, PERRL  Neck: Supple, trachea midline  Lungs:  Clear to auscultation  Heart: Regular rate and rhythm  Abdomen:  Soft, nontender,   Extremities: + upper extremity peripheral edema. Left BKA  Neurologic: Nonfocal, moving all four extremities  Skin: No lesions        Basic Metabolic Panel: Recent Labs  Lab 07/22/19 0448 07/22/19 0448 07/23/19 0450 07/23/19 0450 07/24/19 0533 07/25/19 0606 07/26/19 0559  NA 131*  --  132*  --  133* 132* 135  K 4.1  --  4.4  --  4.4 4.5 4.6  CL 106  --  109  --  108 106 108  CO2 16*  --  18*  --  18* 19* 21*  GLUCOSE 104*  --  106*  --  136* 142* 99  BUN 37*  --  36*  --  38* 37* 38*  CREATININE 2.08*  --  2.11*  --  2.15* 2.06* 2.00*  CALCIUM 7.7*   < > 7.6*   < > 7.6* 7.4* 7.6*  PHOS  --   --  4.9*  --  5.2* 4.9* 4.3   < > = values in this interval not displayed.    Liver Function Tests: Recent Labs  Lab 07/21/19 1221 07/23/19 0450 07/24/19 0533 07/25/19 0606 07/26/19 0559  AST 25  --   --   --   --   ALT 27  --   --   --   --   ALKPHOS 83  --   --   --   --    BILITOT 0.2*  --   --   --   --   PROT 5.7*  --   --   --   --   ALBUMIN 2.2* 1.7* 1.9* 1.9* 1.9*   No results for input(s): LIPASE, AMYLASE in the last 168 hours. No results for input(s): AMMONIA in the last 168 hours.  CBC: Recent Labs  Lab 07/22/19 0448 07/23/19 0450 07/24/19 0533 07/25/19 0606 07/26/19 0559  WBC 10.3 8.5 10.1 9.0 9.0  HGB 7.6* 7.2* 9.4* 9.2* 8.9*  HCT 23.1* 22.9* 28.7* 28.8* 27.4*  MCV 85.2 87.1 86.2 87.8 87.0  PLT 399 357 379 382 378    Cardiac Enzymes: No results for input(s): CKTOTAL, CKMB, CKMBINDEX, TROPONINI in the last 168 hours.  BNP: Invalid input(s): POCBNP  CBG: Recent Labs  Lab 07/25/19 1526 07/25/19 1640 07/25/19 2045 07/26/19 0546 07/26/19 0800  GLUCAP 120* 102* 103* 97 85    Microbiology: Results for orders placed or performed during the hospital encounter of  07/21/19  SARS CORONAVIRUS 2 (TAT 6-24 HRS) Nasopharyngeal     Status: None   Collection Time: 07/21/19  2:34 PM   Specimen: Nasopharyngeal  Result Value Ref Range Status   SARS Coronavirus 2 NEGATIVE NEGATIVE Final    Comment: (NOTE) SARS-CoV-2 target nucleic acids are NOT DETECTED. The SARS-CoV-2 RNA is generally detectable in upper and lower respiratory specimens during the acute phase of infection. Negative results do not preclude SARS-CoV-2 infection, do not rule out co-infections with other pathogens, and should not be used as the sole basis for treatment or other patient management decisions. Negative results must be combined with clinical observations, patient history, and epidemiological information. The expected result is Negative. Fact Sheet for Patients: SugarRoll.be Fact Sheet for Healthcare Providers: https://www.woods-mathews.com/ This test is not yet approved or cleared by the [place] FDA and  has been authorized for detection and/or diagnosis of SARS-CoV-2 by FDA under an Emergency Use Authorization  (EUA). This EUA will remain  in effect (meaning this test can be used) for the duration of the COVID-19 declaration under Section 56 4(b)(1) of the Act, 21 U.S.C. section 360bbb-3(b)(1), unless the authorization is terminated or revoked sooner. Performed at [facility], [place] [address], [place], [place] [address]     Coagulation Studies: Recent Labs    07/24/19 0533 07/25/19 0606  LABPROT 23.6* 21.8*  INR 2.1* 1.9*    Urinalysis: No results for input(s): COLORURINE, LABSPEC, PHURINE, GLUCOSEU, HGBUR, BILIRUBINUR, KETONESUR, PROTEINUR, UROBILINOGEN, NITRITE, LEUKOCYTESUR in the last 72 hours.  Invalid input(s): APPERANCEUR    Imaging: US RENAL ARTERY DUPLEX COMPLETE  Result Date: 07/25/2019 CLINICAL DATA:  86 year old with uncontrolled hypertension. EXAM: RENAL/URINARY TRACT ULTRASOUND RENAL DUPLEX DOPPLER ULTRASOUND COMPARISON:  Renal ultrasound 07/22/2019 FINDINGS: Right Kidney: Length: 10.9. Echogenic cortex. No mass or hydronephrosis visualized. Again noted is an echogenic focus in the lower pole that measures up to 0.4 cm and likely represents a nonobstructive stone. Evidence for a small anechoic cyst in the lower pole measuring up to 1.7 cm. Left Kidney: Length: 11.5. Slightly increased echogenicity in the cortex. Evidence for small left renal cysts. No hydronephrosis. Bladder:  Normal appearance. RENAL DUPLEX ULTRASOUND Right Renal Artery Velocities: Origin:  161 cm/sec Mid:  217 cm/sec Hilum:  140 cm/sec Interlobar:  40 cm/sec Arcuate:  27 cm/sec Left Renal Artery Velocities: Origin:  73 cm/sec Mid:  163 cm/sec Hilum:  55 cm/sec Interlobar:  64 cm/sec Arcuate:  26 cm/sec Aortic Velocity:  137 cm/sec Right Renal-Aortic Ratios: Origin: 1.2 Mid:  1.6 Hilum: 1.0 Interlobar: 0.3 Arcuate: 0.2 Left Renal-Aortic Ratios: Origin: 0.5 Mid: 1.2 Hilum: 0.4 Interlobar: 0.5 Arcuate: 0.2 Bilateral renal veins are patent. Bilateral pleural effusions. Ascites. IMPRESSION: 1. No evidence for  hemodynamically significant renal artery stenosis. 2. Ascites and bilateral pleural effusions. 3. Increased echogenicity in both kidneys and suggestive for chronic medical renal disease. 4. Small bilateral renal cysts. 5. Possible nonobstructive right kidney stone. Electronically Signed   By: Markus Daft M.D.   On: 07/25/2019 15:07     Medications:   . sodium bicarbonate 150 mEq in dextrose 5% 1000 mL     . amLODipine  10 mg Oral Daily  . apixaban  2.5 mg Oral BID  . atorvastatin  40 mg Oral q1800  . hydrALAZINE  50 mg Oral Q8H  . insulin aspart  0-15 Units Subcutaneous TID WC  . insulin aspart  4 Units Subcutaneous TID WC  . levothyroxine  50 mcg Oral Q0600  .  losartan  100 mg Oral Q24H  . mirtazapine  30 mg Oral QHS  . multivitamin with minerals  1 tablet Oral Daily  . sodium bicarbonate  650 mg Oral TID  . sodium chloride flush  3 mL Intravenous Once   acetaminophen **OR** acetaminophen, bisacodyl, hydrALAZINE, ondansetron **OR** ondansetron (ZOFRAN) IV  Assessment/ Plan:  [name] is a 87 y.o. white female with diabetes mellitus type 2, hyperlipidemia, hypertension, chronic kidney disease stage IV baseline creatinine 1.75 with EGFR 27, who was admitted to [facility] 07/21/2019 for Bradycardia [R00.1] Weakness [R53.1] Arm swelling [M79.89] Symptomatic bradycardia [R00.1] AKI (acute kidney injury) ([place]) [N17.9]  1.  Acute kidney injury with metabolic acidosis on chronic kidney disease stage IV with proteinuria and glycosuria.  Baseline creatinine 1.75, GFR of 27 on 07/15/19.  Acute renal secondary to bactrim, acute prerenal azotemia Chronic kidney disease secondary to diabetic nephropathy - Continue gentle IV fluids. Change to sodium bicarbonate.  - IV albumin   2.  Anemia of chronic kidney disease.  Hemoglobin 8.9 - no indication for EPO  3. Hyponatremia: secondary to renal failure. Na 135  Overall prognosis does look poor. Agree with Palliative care consultation.      LOS: 5 Melanie Diaz 3/14/202111:17 AM

## 2019-07-26 NOTE — Progress Notes (Signed)
Patient's arm is slightly warm to touch where previous IV was.  Per Daughter - it looks bigger than yesterday.  Marked area to monitor.  [facility] aware.

## 2019-07-26 NOTE — Progress Notes (Signed)
Report called to receiving RN on 1A. Pt belongings to be packed up and patient will be transferred to room 131 as soon as possible.  Earleen Reaper, RN

## 2019-07-26 NOTE — Progress Notes (Signed)
PROGRESS NOTE    Melanie Diaz  OEU:[id number] DOB: 07/16/1933 DOA: 07/21/2019 PCP: [name]., MD   Brief Narrative:  Melanie Diaz is a 86 y.o. female with medical history significant for DVT on anticoagulation, hypertension, diabetes mellitus and depression.  Patient was recently treated for UTI with Bactrim.  Brought to emergency room by family members for evaluation of increased swelling involving her right upper extremity. Venous Doppler studies were negative for DVT but she found to have bradycardia. Patient was on metoprolol at home, TSH elevated.  Subjective: Patient continued to feel very lethargic and weak.  She does not want to eat.  Stating that whenever she sees food she feels nauseated although no vomiting. She does not want to go to SNF.  Assessment & Plan:   Principal Problem:   Bradycardia Active Problems:   DM (diabetes mellitus), secondary (HCC)   Depression   Hypertension   DVT (deep venous thrombosis) (HCC)   Dehydration   Generalized weakness   History of anemia due to chronic kidney disease   Symptomatic bradycardia   PAD (peripheral artery disease) (HCC)   Malnutrition of moderate degree  Bradycardia.  Resolved.  Likely secondary to beta-blocker.  Apparently patient was on 2 separate doses of metoprolol 25 mg twice daily and 50 mg twice daily, not sure what she was taking.  Bradycardia improved after holding beta-blocker. Echocardiogram with grade 1 diastolic dysfunction, without any regional wall motion abnormalities, normal EF.  Mildly elevated BNP at 668.  Patient does not appear volume overload, has CKD. TSH elevated at 7.9 with free T4 within normal limit, free T3 decreased. -Continue holding metoprolol for now. -Cardiology was consulted from ED-appreciate their recommendations, they are recommending restart of metoprolol at 12.5 mg twice daily once heart rate stabilizes in 80s.  None anion gap metabolic acidosis.  Some improvement.   Seems chronic, most likely secondary to her kidney disease.  Lactic acid within normal limit.  -Continue bicarb tablets. -IV fluid was changed with sodium bicarbonate today. -Nephrology was consulted-appreciate their recommendations.  Mild hyponatremia.  Sodium at 135.  Hyponatremia labs consistent with hyponatremia secondary to renal failure. -Continue to monitor.  AKI with CKD stage IIIb.  Likely prerenal due to bradycardia and poor p.o. intake.  Be some element of disease progression.  Mild improvement in creatinine with gentle hydration. -Nephrology consult-appreciate their recommendations. -Renal ultrasound-consistent with chronic renal disease. - renal venous Doppler studies without any significant stenosis. -Continue to monitor. -Avoid nephrotoxins. -Continue gentle IV hydration.  Hypothyroidism.  Patient with elevated TSH, low free T3 and T4 within normal limits are more consistent with hypothyroidism. -Start her on Synthroid 50 MCG daily.  She will need a repeat TSH in 4 weeks.  Generalized weakness.  Be related to her dehydration, bradycardia poor p.o. intake. hypothyroidism  can be contribute to this complaint.  Worsening renal function and anemia can be contributory. -Get PT/OT evaluation-commending SNF placement but patient wants to go back home with her daughter.  Daughter is getting her home ready, should be able to discharge tomorrow. -Hospital bed ordered. -Palliative care was consulted after discussing with daughter as patient continued to decline and refusing much p.o. intake. -Dietitian consult.  Protein caloric malnutrition and hypoalbuminemia.  Patient with very poor p.o. intake. -Nephrology is trying to give her IV albumin.  Hypertension.  Blood pressure remained elevated. -Continue home dose of amlodipine  -Continue hydralazine to 50 mg 3 times a day. -Increase losartan to 100 mg daily. -Continue  to monitor.  Anemia of chronic disease.  Hemoglobin at 9.2  today after 1 unit of PRBC.  No obvious bleeding.  CBC more consistent with anemia of chronic disease.  Anemia panel with iron deficiency.  She did receive IV iron yesterday. -Her on p.o. iron supplement. -Might need EPO.  Type 2 diabetes.  Well-controlled with A1c of 5.9. -Holding home p.o. meds. -Sensitive sliding scale as needed.  Right upper extremity edema with history of DVT.  Resolved.  Venous Doppler negative for VTE.  She was on Coumadin which was converted to Eliquis yesterday at their request. -Continue Eliquis 2.5 mg twice daily per pharmacy.  PAD.  S/p right BKA. -Continue statin.  Objective: Vitals:   07/25/19 2031 07/26/19 0407 07/26/19 0759 07/26/19 1149  BP: (!) 173/52 (!) 161/45 (!) 171/43 (!) 156/49  Pulse: 77 67 77 61  Resp: 16 16 17 18   Temp: 98.7 F (37.1 C) 98.3 F (36.8 C) 98.7 F (37.1 C) 98.2 F (36.8 C)  TempSrc: Oral  Oral Oral  SpO2: 97% 96% 98% 96%  Weight:  64.5 kg    Height:        Intake/Output Summary (Last 24 hours) at 07/26/2019 1338 Last data filed at 07/25/2019 1848 Gross per 24 hour  Intake 1383.39 ml  Output 500 ml  Net 883.39 ml   Filed Weights   07/23/19 0603 07/24/19 0452 07/26/19 0407  Weight: 56.6 kg 62.2 kg 64.5 kg    Examination:  General exam: Chronically ill-appearing lady, appears calm and comfortable  Respiratory system: Clear to auscultation. Respiratory effort normal. Cardiovascular system: S1 & S2 heard, RRR. No JVD, murmurs, rubs, gallops or clicks. Gastrointestinal system: Soft, nontender, nondistended, bowel sounds positive. Central nervous system: Alert and oriented. No focal neurological deficits.Symmetric 5 x 5 power. Extremities: Right BKA, no edema, no cyanosis, pulses intact and symmetrical. Skin: No rashes, lesions or ulcers Psychiatry: Judgement and insight appear normal.    DVT prophylaxis: Eliquis Code Status: Full Family Communication: Daughter was updated on phone. Disposition Plan: PT is  recommending SNF placement but patient wants to go back at daughter's home.  Nephrology wants to continue gentle IV fluid and giving some IV albumin.  Daughter is making her home ready for a possible discharge tomorrow.  Hospital bed ordered today.  Patient is high risk for deterioration as refusing to take much p.o. intake and continue to remain very lethargic.  Palliative care was consulted after discussing with daughter.  Consultants:   Cardiology  Nephrology  Palliative care  Procedures:  Antimicrobials:   Data Reviewed: I have personally reviewed following labs and imaging studies  CBC: Recent Labs  Lab 07/22/19 0448 07/23/19 0450 07/24/19 0533 07/25/19 0606 07/26/19 0559  WBC 10.3 8.5 10.1 9.0 9.0  HGB 7.6* 7.2* 9.4* 9.2* 8.9*  HCT 23.1* 22.9* 28.7* 28.8* 27.4*  MCV 85.2 87.1 86.2 87.8 87.0  PLT 399 357 379 382 233   Basic Metabolic Panel: Recent Labs  Lab 07/22/19 0448 07/23/19 0450 07/24/19 0533 07/25/19 0606 07/26/19 0559  NA 131* 132* 133* 132* 135  K 4.1 4.4 4.4 4.5 4.6  CL 106 109 108 106 108  CO2 16* 18* 18* 19* 21*  GLUCOSE 104* 106* 136* 142* 99  BUN 37* 36* 38* 37* 38*  CREATININE 2.08* 2.11* 2.15* 2.06* 2.00*  CALCIUM 7.7* 7.6* 7.6* 7.4* 7.6*  PHOS  --  4.9* 5.2* 4.9* 4.3   GFR: Estimated Creatinine Clearance: 18.8 mL/min (A) (by C-G formula based on SCr  of 2 mg/dL (H)). Liver Function Tests: Recent Labs  Lab 07/21/19 1221 07/23/19 0450 07/24/19 0533 07/25/19 0606 07/26/19 0559  AST 25  --   --   --   --   ALT 27  --   --   --   --   ALKPHOS 83  --   --   --   --   BILITOT 0.2*  --   --   --   --   PROT 5.7*  --   --   --   --   ALBUMIN 2.2* 1.7* 1.9* 1.9* 1.9*   No results for input(s): LIPASE, AMYLASE in the last 168 hours. No results for input(s): AMMONIA in the last 168 hours. Coagulation Profile: Recent Labs  Lab 07/21/19 1221 07/22/19 0448 07/23/19 0450 07/24/19 0533 07/25/19 0606  INR 1.8* 1.7* 2.2* 2.1* 1.9*    Cardiac Enzymes: No results for input(s): CKTOTAL, CKMB, CKMBINDEX, TROPONINI in the last 168 hours. BNP (last 3 results) No results for input(s): PROBNP in the last 8760 hours. HbA1C: No results for input(s): HGBA1C in the last 72 hours. CBG: Recent Labs  Lab 07/25/19 1640 07/25/19 2045 07/26/19 0546 07/26/19 0800 07/26/19 1153  GLUCAP 102* 103* 97 85 130*   Lipid Profile: No results for input(s): CHOL, HDL, LDLCALC, TRIG, CHOLHDL, LDLDIRECT in the last 72 hours. Thyroid Function Tests: No results for input(s): TSH, T4TOTAL, FREET4, T3FREE, THYROIDAB in the last 72 hours. Anemia Panel: No results for input(s): VITAMINB12, FOLATE, FERRITIN, TIBC, IRON, RETICCTPCT in the last 72 hours. Sepsis Labs: Recent Labs  Lab 07/22/19 0918 07/22/19 1157  LATICACIDVEN 0.5 0.7    Recent Results (from the past 240 hour(s))  SARS CORONAVIRUS 2 (TAT 6-24 HRS) Nasopharyngeal     Status: None   Collection Time: 07/21/19  2:34 PM   Specimen: Nasopharyngeal  Result Value Ref Range Status   SARS Coronavirus 2 NEGATIVE NEGATIVE Final    Comment: (NOTE) SARS-CoV-2 target nucleic acids are NOT DETECTED. The SARS-CoV-2 RNA is generally detectable in upper and lower respiratory specimens during the acute phase of infection. Negative results do not preclude SARS-CoV-2 infection, do not rule out co-infections with other pathogens, and should not be used as the sole basis for treatment or other patient management decisions. Negative results must be combined with clinical observations, patient history, and epidemiological information. The expected result is Negative. Fact Sheet for Patients: SugarRoll.be Fact Sheet for Healthcare Providers: https://www.woods-mathews.com/ This test is not yet approved or cleared by the [place] FDA and  has been authorized for detection and/or diagnosis of SARS-CoV-2 by FDA under an Emergency Use Authorization  (EUA). This EUA will remain  in effect (meaning this test can be used) for the duration of the COVID-19 declaration under Section 56 4(b)(1) of the Act, 21 U.S.C. section 360bbb-3(b)(1), unless the authorization is terminated or revoked sooner. Performed at [facility], [place] [address] [place], [place] [address]      Radiology Studies: US RENAL ARTERY DUPLEX COMPLETE  Result Date: 07/25/2019 CLINICAL DATA:  86 year old with uncontrolled hypertension. EXAM: RENAL/URINARY TRACT ULTRASOUND RENAL DUPLEX DOPPLER ULTRASOUND COMPARISON:  Renal ultrasound 07/22/2019 FINDINGS: Right Kidney: Length: 10.9. Echogenic cortex. No mass or hydronephrosis visualized. Again noted is an echogenic focus in the lower pole that measures up to 0.4 cm and likely represents a nonobstructive stone. Evidence for a small anechoic cyst in the lower pole measuring up to 1.7 cm. Left Kidney: Length: 11.5. Slightly increased echogenicity in the  cortex. Evidence for small left renal cysts. No hydronephrosis. Bladder:  Normal appearance. RENAL DUPLEX ULTRASOUND Right Renal Artery Velocities: Origin:  161 cm/sec Mid:  217 cm/sec Hilum:  140 cm/sec Interlobar:  40 cm/sec Arcuate:  27 cm/sec Left Renal Artery Velocities: Origin:  73 cm/sec Mid:  163 cm/sec Hilum:  55 cm/sec Interlobar:  64 cm/sec Arcuate:  26 cm/sec Aortic Velocity:  137 cm/sec Right Renal-Aortic Ratios: Origin: 1.2 Mid:  1.6 Hilum: 1.0 Interlobar: 0.3 Arcuate: 0.2 Left Renal-Aortic Ratios: Origin: 0.5 Mid: 1.2 Hilum: 0.4 Interlobar: 0.5 Arcuate: 0.2 Bilateral renal veins are patent. Bilateral pleural effusions. Ascites. IMPRESSION: 1. No evidence for hemodynamically significant renal artery stenosis. 2. Ascites and bilateral pleural effusions. 3. Increased echogenicity in both kidneys and suggestive for chronic medical renal disease. 4. Small bilateral renal cysts. 5. Possible nonobstructive right kidney stone. Electronically Signed   By: Markus Daft M.D.   On:  07/25/2019 15:07    Scheduled Meds:  amLODipine  10 mg Oral Daily   apixaban  2.5 mg Oral BID   atorvastatin  40 mg Oral q1800   hydrALAZINE  50 mg Oral Q8H   insulin aspart  0-15 Units Subcutaneous TID WC   insulin aspart  4 Units Subcutaneous TID WC   levothyroxine  50 mcg Oral Q0600   losartan  100 mg Oral Q24H   mirtazapine  30 mg Oral QHS   multivitamin with minerals  1 tablet Oral Daily   sodium bicarbonate  650 mg Oral TID   sodium chloride flush  3 mL Intravenous Once   Continuous Infusions:  sodium bicarbonate 150 mEq in dextrose 5% 1000 mL       LOS: 5 days   Time spent: 40 minutes.   Lorella Nimrod, MD [facility]  If 7PM-7AM, please contact night-coverage [email or web address]  07/26/2019, 1:38 PM   This record has been created using Systems analyst. Errors have been sought and corrected,but may not always be located. Such creation errors do not reflect on the standard of care.

## 2019-07-26 NOTE — Progress Notes (Signed)
Spoke to Nephrology MD-  OK to stop the IV Bicarb and give oral dose.

## 2019-07-26 NOTE — Progress Notes (Signed)
Student-RN Elberta Fortis noted left heel red, but blanchable. Heel protector applied and extremity elevated on pillow to prevent breakdown.  Earleen Reaper, RN

## 2019-07-26 NOTE — Progress Notes (Signed)
SUBJECTIVE: No acute events overnight. HR remains stable in the 60s-70s and patient remains hypertensive.   Vitals:   07/25/19 2031 07/26/19 0407 07/26/19 0759 07/26/19 1149  BP: (!) 173/52 (!) 161/45 (!) 171/43 (!) 156/49  Pulse: 77 67 77 61  Resp: 16 16 17 18   Temp: 98.7 F (37.1 C) 98.3 F (36.8 C) 98.7 F (37.1 C) 98.2 F (36.8 C)  TempSrc: Oral  Oral Oral  SpO2: 97% 96% 98% 96%  Weight:  64.5 kg    Height:        Intake/Output Summary (Last 24 hours) at 07/26/2019 1530 Last data filed at 07/25/2019 1848 Gross per 24 hour  Intake 1143.39 ml  Output 500 ml  Net 643.39 ml    LABS: Basic Metabolic Panel: Recent Labs    07/25/19 0606 07/26/19 0559  NA 132* 135  K 4.5 4.6  CL 106 108  CO2 19* 21*  GLUCOSE 142* 99  BUN 37* 38*  CREATININE 2.06* 2.00*  CALCIUM 7.4* 7.6*  PHOS 4.9* 4.3   Liver Function Tests: Recent Labs    07/25/19 0606 07/26/19 0559  ALBUMIN 1.9* 1.9*   No results for input(s): LIPASE, AMYLASE in the last 72 hours. CBC: Recent Labs    07/25/19 0606 07/26/19 0559  WBC 9.0 9.0  HGB 9.2* 8.9*  HCT 28.8* 27.4*  MCV 87.8 87.0  PLT 382 378   Cardiac Enzymes: No results for input(s): CKTOTAL, CKMB, CKMBINDEX, TROPONINI in the last 72 hours. BNP: Invalid input(s): POCBNP D-Dimer: No results for input(s): DDIMER in the last 72 hours. Hemoglobin A1C: No results for input(s): HGBA1C in the last 72 hours. Fasting Lipid Panel: No results for input(s): CHOL, HDL, LDLCALC, TRIG, CHOLHDL, LDLDIRECT in the last 72 hours. Thyroid Function Tests: No results for input(s): TSH, T4TOTAL, T3FREE, THYROIDAB in the last 72 hours.  Invalid input(s): FREET3 Anemia Panel: No results for input(s): VITAMINB12, FOLATE, FERRITIN, TIBC, IRON, RETICCTPCT in the last 72 hours.  TELEMETRY: 70s NSR  ASSESSMENT AND PLAN: Patient continues to have resolved bradycardia, but is remaining in the 60-70s. With the patient's current malaise, difficult to tell if  starting metoprolol back on if she would be bradycardic symptomatic vs chronic weakness and therefore continue to hold beta blockers. Hypertension continues to mildly improve with the recent increase of losartan to 100mg  daily without any worsening kidney function. Please continue maintenance fluids for now. Renal artery duplex without significant stenosis. Will need to continue to monitor the patient's wide pulse pressure in determining BP goal of systolic <374. Please continue current regimen and will continue to hold diuretics for HTN control. From a cardiac point of view the patient is stable to be discharged to daughter's home. Please schedule a cardiac follow up with [facility] within 2 weeks of discharge.  Principal Problem:   Bradycardia Active Problems:   DM (diabetes mellitus), secondary ([place])   Depression   Hypertension   DVT (deep venous thrombosis) (HCC)   Dehydration   Generalized weakness   History of anemia due to chronic kidney disease   Symptomatic bradycardia   PAD (peripheral artery disease) ([place])   Malnutrition of moderate degree    Adaline Sill, NP-C    07/26/2019 3:30 PM

## 2019-07-27 DIAGNOSIS — R630 Anorexia: Secondary | ICD-10-CM

## 2019-07-27 DIAGNOSIS — R6881 Early satiety: Secondary | ICD-10-CM

## 2019-07-27 DIAGNOSIS — R110 Nausea: Secondary | ICD-10-CM

## 2019-07-27 LAB — GLOMERULAR BASEMENT MEMBRANE ANTIBODIES: GBM Ab: 2 units (ref 0–20)

## 2019-07-27 LAB — RENAL FUNCTION PANEL
Albumin: 2.2 g/dL — ABNORMAL LOW (ref 3.5–5.0)
Anion gap: 3 — ABNORMAL LOW (ref 5–15)
BUN: 38 mg/dL — ABNORMAL HIGH (ref 8–23)
CO2: 22 mmol/L (ref 22–32)
Calcium: 7.6 mg/dL — ABNORMAL LOW (ref 8.9–10.3)
Chloride: 106 mmol/L (ref 98–111)
Creatinine, Ser: 1.96 mg/dL — ABNORMAL HIGH (ref 0.44–1.00)
GFR calc Af Amer: 27 mL/min — ABNORMAL LOW (ref >60)
GFR calc non Af Amer: 23 mL/min — ABNORMAL LOW (ref >60)
Glucose, Bld: 129 mg/dL — ABNORMAL HIGH (ref 70–99)
Phosphorus: 3.7 mg/dL (ref 2.5–4.6)
Potassium: 4.5 mmol/L (ref 3.5–5.1)
Sodium: 131 mmol/L — ABNORMAL LOW (ref 135–145)

## 2019-07-27 LAB — GLUCOSE, CAPILLARY
Glucose-Capillary: 124 mg/dL — ABNORMAL HIGH (ref 70–99)
Glucose-Capillary: 119 mg/dL — ABNORMAL HIGH (ref 70–99)
Glucose-Capillary: 148 mg/dL — ABNORMAL HIGH (ref 70–99)
Glucose-Capillary: 120 mg/dL — ABNORMAL HIGH (ref 70–99)

## 2019-07-27 MED ORDER — FUROSEMIDE 10 MG/ML IJ SOLN
20.0000 mg | Freq: Every day | INTRAMUSCULAR | Status: DC
  Administered 2019-07-27 – 2019-07-28 (×2): 20 mg via INTRAVENOUS
  Filled 2019-07-27 (×3): qty 4

## 2019-07-27 MED ORDER — HYDRALAZINE HCL 50 MG PO TABS
75.0000 mg | ORAL_TABLET | Freq: Three times a day (TID) | ORAL | Status: DC
  Administered 2019-07-27 – 2019-07-28 (×2): 75 mg via ORAL
  Filled 2019-07-27 (×3): qty 1

## 2019-07-27 NOTE — TOC Progression Note (Signed)
Transition of Care [facility]) - Progression Note    Patient Details  Name: Melanie Diaz MRN: [id number] Date of Birth: October 17, 1933  Transition of Care [facility]) CM/SW Contact  Zakya Halabi, Gardiner Rhyme, LCSW Phone Number: 07/27/2019, 1:39 PM  Clinical Narrative: have found [facility] to take home health referral, have made daughter aware of this. Hospital bed to hopefully go out today. Daughter needs this delivered prior to pt coming home.        Barriers to Discharge: Inadequate or no insurance  Expected Discharge Plan and Services                                                 Social Determinants of Health (SDOH) Interventions    Readmission Risk Interventions No flowsheet data found.

## 2019-07-27 NOTE — TOC Progression Note (Addendum)
Transition of Care [facility]) - Progression Note    Patient Details  Name: Melanie Diaz MRN: [id number] Date of Birth: 1934/05/14  Transition of Care [facility]) CM/SW Contact  Arrick Dutton, Gardiner Rhyme, LCSW Phone Number: 07/27/2019, 9:24 AM  Clinical Narrative:   Spoke with daughter-Angie via telephone to discuss need for hospital bed. She agrees have ordered via [employer]. Pt is not feeling well today somewhat nauseous. Still continue to look for home health agency to accept her referral. [facility] has declined and [facility] is checking again to see if can take.  11:35 [facility] declined, [facility] care declined.     Barriers to Discharge: Inadequate or no insurance  Expected Discharge Plan and Services                                                 Social Determinants of Health (SDOH) Interventions    Readmission Risk Interventions No flowsheet data found.

## 2019-07-27 NOTE — Progress Notes (Cosign Needed)
  Diagnosis code:R53.1, I 63.9  Height:5'2   Weight:142 lbs   Patient has generalized weakness, along with history of CVA"s. which requires her upper body to be positioned in ways not feasible with a normal bed.  Head must be elevated at least 30 degrees. Her body requires frequent and immediate changes in body position which cannot be achieved with a normal bed.

## 2019-07-27 NOTE — Care Management Important Message (Signed)
Important Message  Patient Details  Name: Melanie Diaz MRN: [id number] Date of Birth: 05-24-1933   Medicare Important Message Given:  Yes     Juliann Pulse A Sherman Lipuma 07/27/2019, 11:46 AM

## 2019-07-27 NOTE — Consult Note (Signed)
Cephas Darby, MD [address]  [place]  [place], [place] [address]  Main: [phone]  Fax: [phone] Pager: [phone]   Consultation  Referring Provider:     No ref. provider found Primary Care Physician:  [name]., MD Primary Gastroenterologist: Althia Forts        Reason for Consultation: Nausea, burping and fullness of stomach  Date of Admission:  07/21/2019 Date of Consultation:  07/27/2019         HPI:   TREASE BREMNER is a 86 y.o. female with history of DVT on Coumadin, hypertension, diabetes, depression. She is admitted on 07/21/2019 secondary to swelling of her right upper extremity, poor p.o. intake and generalized weakness.  She is found to have worsening kidney function, bradycardia heart rate between 42 and 44.  Patient was evaluated by cardiology, beta blockers were discontinued with improvement in heart rate.  She is also started on Synthroid due to elevated TSH and lethargy.  GI is consulted due to anorexia, nausea without any vomiting.  She was also complaining of fullness in stomach and burping. Patient reports that she had a tick bite and was treated with antibiotics last summer.  She had stomach upset with antibiotics which has improved.  Subsequently, she developed right upper arm swelling, was told she had cellulitis.  Patient reports that since then her appetite has been gradually declining.  Patient's daughter is bedside who reports that she has been depressed lately since the death of her best body.  Patient reports feeling nauseous and full after a few bites only.  She denies abdominal pain, constipation, vomiting.  Coumadin was discontinued since admission and switched to Eliquis on 3/15  Her daughter is planning to bring her home to stay with her when she is discharged  NSAIDs: None  Antiplts/Anticoagulants/Anti thrombotics: Coumadin for history of DVT  GI Procedures: None  Past Medical History:  Diagnosis Date  .  Depression   . Diabetes mellitus without complication ([place])   . History of deep vein thrombosis    about 30 years ago  . Hyperlipidemia 07/06/2005  . Hypertension     Past Surgical History:  Procedure Laterality Date  . LEG AMPUTATION Right     Prior to Admission medications   Medication Sig Start Date End Date Taking? Authorizing Provider  amLODipine (NORVASC) 10 MG tablet Take 1 tablet (10 mg total) by mouth daily. 06/08/19  Yes [name]., MD  atorvastatin (LIPITOR) 40 MG tablet Take 1 tablet (40 mg total) by mouth daily at 6 PM. 09/09/18  Yes [name]., MD  glipiZIDE (GLUCOTROL) 10 MG tablet Take 1 tablet (10 mg total) by mouth daily. 01/26/19  Yes [name]., MD  losartan (COZAAR) 50 MG tablet Take 1 tablet (50 mg total) by mouth daily. 07/15/19  Yes [name]., MD  metoprolol tartrate (LOPRESSOR) 25 MG tablet Take 25 mg by mouth every morning.   Yes [provider]  metoprolol tartrate (LOPRESSOR) 50 MG tablet Take 1 tablet (50 mg total) by mouth 2 (two) times daily. Patient taking differently: Take 50 mg by mouth every evening.  12/30/18  Yes [name]., MD  mirtazapine (REMERON) 30 MG tablet Take 1 tablet (30 mg total) by mouth at bedtime. 07/21/19  Yes [name]., MD  warfarin (COUMADIN) 2 MG tablet Take 1 tablet (2 mg total) by mouth daily. 07/06/19  Yes [name].,  MD  sulfamethoxazole-trimethoprim (BACTRIM) 400-80 MG tablet Take 1 tablet by mouth 2 (two) times daily. Patient not taking: Reported on 07/21/2019 07/15/19   [name]., MD   Current Facility-Administered Medications:  .  acetaminophen (TYLENOL) tablet 650 mg, 650 mg, Oral, Q6H PRN, 650 mg at 07/26/19 0533 **OR** acetaminophen (TYLENOL) suppository 650 mg, 650 mg, Rectal, Q6H PRN, Agbata, Tochukwu, MD .  amLODipine (NORVASC) tablet 10 mg, 10 mg, Oral, Daily, Agbata, Tochukwu, MD, 10 mg at 07/27/19 0840 .  atorvastatin  (LIPITOR) tablet 40 mg, 40 mg, Oral, q1800, Agbata, Tochukwu, MD, 40 mg at 07/26/19 1849 .  bisacodyl (DULCOLAX) suppository 10 mg, 10 mg, Rectal, Daily PRN, Sharion Settler, NP, 10 mg at 07/25/19 2119 .  furosemide (LASIX) injection 20 mg, 20 mg, Intravenous, Daily, Kolluru, Sarath, MD, 20 mg at 07/27/19 1312 .  hydrALAZINE (APRESOLINE) injection 10 mg, 10 mg, Intravenous, Q6H PRN, Adaline Sill, NP .  hydrALAZINE (APRESOLINE) tablet 75 mg, 75 mg, Oral, Q8H, Amin, Sumayya, MD .  insulin aspart (novoLOG) injection 0-15 Units, 0-15 Units, Subcutaneous, TID WC, Agbata, Tochukwu, MD, 2 Units at 07/27/19 1655 .  insulin aspart (novoLOG) injection 4 Units, 4 Units, Subcutaneous, TID WC, Agbata, Tochukwu, MD, 4 Units at 07/27/19 0842 .  levothyroxine (SYNTHROID) tablet 50 mcg, 50 mcg, Oral, Q0600, Lorella Nimrod, MD, 50 mcg at 07/27/19 0620 .  losartan (COZAAR) tablet 100 mg, 100 mg, Oral, Q24H, Adaline Sill, NP, 100 mg at 07/26/19 2120 .  mirtazapine (REMERON) tablet 30 mg, 30 mg, Oral, QHS, Agbata, Tochukwu, MD, 30 mg at 07/26/19 2120 .  multivitamin with minerals tablet 1 tablet, 1 tablet, Oral, Daily, Lorella Nimrod, MD, 1 tablet at 07/27/19 0839 .  ondansetron (ZOFRAN) tablet 4 mg, 4 mg, Oral, Q6H PRN, 4 mg at 07/22/19 2019 **OR** ondansetron (ZOFRAN) injection 4 mg, 4 mg, Intravenous, Q6H PRN, Agbata, Tochukwu, MD, 4 mg at 07/27/19 1019 .  sodium bicarbonate 150 mEq in dextrose 5% 1000 mL infusion, 150 mEq, Intravenous, Continuous, Kolluru, Sarath, MD, Stopped at 07/26/19 1842 .  sodium bicarbonate tablet 650 mg, 650 mg, Oral, TID, Lorella Nimrod, MD, 650 mg at 07/27/19 1656 .  sodium chloride flush (NS) 0.9 % injection 3 mL, 3 mL, Intravenous, Once, Merlyn Lot, MD   Family History  Problem Relation Age of Onset  . Kidney failure Mother   . Heart attack Sister        Hx of MI  . Diabetes Brother        borderline diabetes  . Cancer Maternal Aunt        Ovarian cancer  . Breast  cancer Daughter      Social History   Tobacco Use  . Smoking status: Never Smoker  . Smokeless tobacco: Never Used  . Tobacco comment: smoking cessation materials not required  Substance Use Topics  . Alcohol use: No  . Drug use: No    Allergies as of 07/21/2019 - Review Complete 07/21/2019  Allergen Reaction Noted  . Amoxicillin Other (See Comments) 12/20/2014  . Azithromycin  11/16/2014    Review of Systems:    All systems reviewed and negative except where noted in HPI.   Physical Exam:  Vital signs in last 24 hours: Temp:  [98.3 F (36.8 C)-98.5 F (36.9 C)] 98.3 F (36.8 C) (03/15 1602) Pulse Rate:  [62-73] 62 (03/15 1556) Resp:  [16] 16 (03/15 0619) BP: (148-172)/(32-55) 150/32 (03/15 1556) SpO2:  [93 %-97 %] 97 % (03/15 1556)  Last BM Date: 07/26/19 General: Appears dull, not in distress Head:  Normocephalic and atraumatic. Eyes:   No icterus.   Conjunctiva pink. PERRLA. Ears:  Normal auditory acuity. Neck:  Supple; no masses or thyroidomegaly Lungs: Respirations even and unlabored. Lungs clear to auscultation bilaterally.   No wheezes, crackles, or rhonchi.  Heart:  Regular rate and rhythm;  Without murmur, clicks, rubs or gallops Abdomen:  Soft, nondistended, nontender. Normal bowel sounds. No appreciable masses or hepatomegaly.  No rebound or guarding.  Rectal:  Not performed. Msk:  Symmetrical without gross deformities.  Strength generalized weakness Extremities:  Without edema, cyanosis or clubbing. Neurologic:  Alert and oriented x3;  grossly normal neurologically. Skin: Large erythematous rash in her left upper arm involving the elbow, elbow and below, intact without significant lesions Psych:  Alert and cooperative.  Flat affect.  LAB RESULTS: CBC Latest Ref Rng & Units 07/26/2019 07/25/2019 07/24/2019  WBC 4.0 - 10.5 K/uL 9.0 9.0 10.1  Hemoglobin 12.0 - 15.0 g/dL 8.9(L) 9.2(L) 9.4(L)  Hematocrit 36.0 - 46.0 % 27.4(L) 28.8(L) 28.7(L)  Platelets 150 -  400 K/uL 378 382 379    BMET BMP Latest Ref Rng & Units 07/27/2019 07/26/2019 07/25/2019  Glucose 70 - 99 mg/dL 129(H) 99 142(H)  BUN 8 - 23 mg/dL 38(H) 38(H) 37(H)  Creatinine 0.44 - 1.00 mg/dL 1.96(H) 2.00(H) 2.06(H)  BUN/Creat Ratio 12 - 28 - - -  Sodium 135 - 145 mmol/L 131(L) 135 132(L)  Potassium 3.5 - 5.1 mmol/L 4.5 4.6 4.5  Chloride 98 - 111 mmol/L 106 108 106  CO2 22 - 32 mmol/L 22 21(L) 19(L)  Calcium 8.9 - 10.3 mg/dL 7.6(L) 7.6(L) 7.4(L)    LFT Hepatic Function Latest Ref Rng & Units 07/27/2019 07/26/2019 07/25/2019  Total Protein 6.5 - 8.1 g/dL - - -  Albumin 3.5 - 5.0 g/dL 2.2(L) 1.9(L) 1.9(L)  AST 15 - 41 U/L - - -  ALT 0 - 44 U/L - - -  Alk Phosphatase 38 - 126 U/L - - -  Total Bilirubin 0.3 - 1.2 mg/dL - - -  Bilirubin, Direct 0.0 - 0.2 mg/dL - - -     STUDIES: No results found.    Impression / Plan:   YUMNA EBERS is a 86 y.o. female with history of DVT on Coumadin, CKD, chronic anemia of chronic kidney disease, hypertension, diabetes, depression who is admitted with failure to thrive, poor p.o. intake, AKI on CKD.  GI is consulted for nausea, early satiety and anorexia  Nausea, early satiety and anorexia Likely multifactorial in etiology, combination of underlying depression, other structural causes like delayed gastric emptying, viral gastritis or H. pylori infection or peptic ulcer disease or erosive esophagitis  Recommend to start pantoprazole 40 mg IV twice daily Switch from Remeron to Zyprexa 2.5 to 5 mg at bedtime Patient is currently on Eliquis, therefore will perform diagnostic upper endoscopy only tomorrow in order to expedite her discharge process Hold Eliquis today and tomorrow N.p.o. past midnight Treat underlying depression Further recommendations after upper endoscopy  Discussed my recommendations with patient and her daughter who are in agreement  Thank you for involving me in the care of this patient.      LOS: 6 days   Sherri Sear, MD  07/27/2019, 6:00 PM   Note: This dictation was prepared with Dragon dictation along with smaller phrase technology. Any transcriptional errors that result from this process are unintentional.

## 2019-07-27 NOTE — Progress Notes (Addendum)
PROGRESS NOTE    Melanie Diaz  IOM:[id number] DOB: 18-Jan-1934 DOA: 07/21/2019 PCP: [name]., MD   Brief Narrative:  Melanie Diaz is a 86 y.o. female with medical history significant for DVT on anticoagulation, hypertension, diabetes mellitus and depression.  Patient was recently treated for UTI with Bactrim.  Brought to emergency room by family members for evaluation of increased swelling involving her right upper extremity. Venous Doppler studies were negative for DVT but she found to have bradycardia. Patient was on metoprolol at home, TSH elevated.  Subjective: Patient continued to feel very lethargic and weak.  She was unable to eat food, stating that site of food makes her nauseated.  Assessment & Plan:   Principal Problem:   Bradycardia Active Problems:   DM (diabetes mellitus), secondary (HCC)   Depression   Hypertension   DVT (deep venous thrombosis) (HCC)   Dehydration   Generalized weakness   History of anemia due to chronic kidney disease   Symptomatic bradycardia   PAD (peripheral artery disease) (HCC)   Malnutrition of moderate degree  Bradycardia.  Resolved.  Likely secondary to beta-blocker.  Apparently patient was on 2 separate doses of metoprolol 25 mg twice daily and 50 mg twice daily, not sure what she was taking.  Bradycardia improved after holding beta-blocker. Echocardiogram with grade 1 diastolic dysfunction, without any regional wall motion abnormalities, normal EF.  Mildly elevated BNP at 668.  Patient does not appear volume overload, has CKD. TSH elevated at 7.9 with free T4 within normal limit, free T3 decreased. -Continue holding metoprolol for now. -Cardiology was consulted from ED-appreciate their recommendations, they are recommending restart of metoprolol at 12.5 mg twice daily once heart rate stabilizes in 80s.  None anion gap metabolic acidosis.  Improved.  Seems chronic, most likely secondary to her kidney disease.  Lactic  acid within normal limit.   -Continue bicarb tablets. -IV fluid was changed with sodium bicarbonate by nephrology. -Nephrology was consulted-appreciate their recommendations.  Mild hyponatremia.  Sodium at 131.  Hyponatremia labs consistent with hyponatremia secondary to renal failure. -Neurology also started her on a low-dose Lasix. -Continue to monitor.  AKI with CKD stage IIIb.  Likely prerenal due to bradycardia and poor p.o. intake.  Be some element of disease progression.  Mild improvement in creatinine with gentle hydration. -Nephrology consult-appreciate their recommendations. -Renal ultrasound-consistent with chronic renal disease. - renal venous Doppler studies without any significant stenosis. -Continue to monitor. -Avoid nephrotoxins. -Continue gentle IV hydration.  Dyspepsia/nausea with the site of food.  Patient was complaining of fullness and burping.  She feels nauseated without any vomiting with the site of food.  Extremely poor p.o. intake. -GI was consulted to see if she needs any investigation.  No prior history of endoscopy or colonoscopy.  Hypothyroidism.  Patient with elevated TSH, low free T3 and T4 within normal limits are more consistent with hypothyroidism. -Start her on Synthroid 50 MCG daily.  She will need a repeat TSH in 4 weeks.  Generalized weakness.  Be related to her dehydration, bradycardia poor p.o. intake. hypothyroidism  can be contribute to this complaint.  Worsening renal function and anemia can be contributory. -Get PT/OT evaluation-commending SNF placement but patient wants to go back home with her daughter.  Daughter is getting her home ready, should be able to discharge tomorrow. -Hospital bed ordered. -Palliative care was consulted after discussing with daughter as patient continued to decline and refusing much p.o. intake. -Dietitian consult.  Protein caloric malnutrition  and hypoalbuminemia.  Patient with very poor p.o. intake. -Received  albumin yesterday by nephrology.  Hypertension.  Blood pressure remained elevated. -Continue home dose of amlodipine  -Increase hydralazine to 75 mg 3 times a day. -Continue losartan to 100 mg daily. -Continue to monitor.  Anemia of chronic disease.  Hemoglobin at 9.2 today after 1 unit of PRBC.  No obvious bleeding.  CBC more consistent with anemia of chronic disease.  Anemia panel with iron deficiency.  She did receive IV iron yesterday. -Her on p.o. iron supplement. -Might need EPO.  Type 2 diabetes.  Well-controlled with A1c of 5.9. -Holding home p.o. meds. -Sensitive sliding scale as needed.  Right upper extremity edema with history of DVT.  Resolved.  Venous Doppler negative for VTE.  She was on Coumadin which was converted to Eliquis yesterday at their request. -Continue Eliquis 2.5 mg twice daily per pharmacy.  PAD.  S/p right BKA. -Continue statin.  Objective: Vitals:   07/26/19 2306 07/27/19 0619 07/27/19 0825 07/27/19 1310  BP: (!) 156/50 (!) 171/52 (!) 172/53 (!) 148/55  Pulse: 73 73 73   Resp: 16 16    Temp: 98.3 F (36.8 C)  98.5 F (36.9 C)   TempSrc: Oral  Oral   SpO2: 94% 93% 94%   Weight:      Height:        Intake/Output Summary (Last 24 hours) at 07/27/2019 1442 Last data filed at 07/27/2019 1023 Gross per 24 hour  Intake 720 ml  Output 0 ml  Net 720 ml   Filed Weights   07/23/19 0603 07/24/19 0452 07/26/19 0407  Weight: 56.6 kg 62.2 kg 64.5 kg    Examination:  General exam: Chronically ill-appearing lady, appears calm and comfortable  Respiratory system: Clear to auscultation. Respiratory effort normal. Cardiovascular system: S1 & S2 heard, RRR. No JVD, murmurs, rubs, gallops or clicks. Gastrointestinal system: Soft, nontender, nondistended, bowel sounds positive. Central nervous system: Alert and oriented. No focal neurological deficits.Symmetric 5 x 5 power. Extremities: Right BKA, no edema, no cyanosis, pulses intact and  symmetrical. Skin: No rashes, lesions or ulcers Psychiatry: Judgement and insight appear normal.  Patient seems depressed and withdrawn.   DVT prophylaxis: Eliquis Code Status: Full Family Communication: Daughter was updated on phone. Disposition Plan: PT is recommending SNF placement but patient wants to go back at daughter's home.  Nephrology wants to continue gentle IV fluid and giving some IV albumin.  Daughter is making her home ready for a possible discharge.  Hospital bed ordered today.  Patient is high risk for deterioration as refusing to take much p.o. intake and continue to remain very lethargic.   Pending palliative care and GI consult.  Consultants:   Cardiology  Nephrology  Palliative care  GI  Procedures:  Antimicrobials:   Data Reviewed: I have personally reviewed following labs and imaging studies  CBC: Recent Labs  Lab 07/22/19 0448 07/23/19 0450 07/24/19 0533 07/25/19 0606 07/26/19 0559  WBC 10.3 8.5 10.1 9.0 9.0  HGB 7.6* 7.2* 9.4* 9.2* 8.9*  HCT 23.1* 22.9* 28.7* 28.8* 27.4*  MCV 85.2 87.1 86.2 87.8 87.0  PLT 399 357 379 382 604   Basic Metabolic Panel: Recent Labs  Lab 07/23/19 0450 07/24/19 0533 07/25/19 0606 07/26/19 0559 07/27/19 0437  NA 132* 133* 132* 135 131*  K 4.4 4.4 4.5 4.6 4.5  CL 109 108 106 108 106  CO2 18* 18* 19* 21* 22  GLUCOSE 106* 136* 142* 99 129*  BUN 36* 38*  37* 38* 38*  CREATININE 2.11* 2.15* 2.06* 2.00* 1.96*  CALCIUM 7.6* 7.6* 7.4* 7.6* 7.6*  PHOS 4.9* 5.2* 4.9* 4.3 3.7   GFR: Estimated Creatinine Clearance: 19.2 mL/min (A) (by C-G formula based on SCr of 1.96 mg/dL (H)). Liver Function Tests: Recent Labs  Lab 07/21/19 1221 07/21/19 1221 07/23/19 0450 07/24/19 0533 07/25/19 0606 07/26/19 0559 07/27/19 0437  AST 25  --   --   --   --   --   --   ALT 27  --   --   --   --   --   --   ALKPHOS 83  --   --   --   --   --   --   BILITOT 0.2*  --   --   --   --   --   --   PROT 5.7*  --   --   --   --    --   --   ALBUMIN 2.2*   < > 1.7* 1.9* 1.9* 1.9* 2.2*   < > = values in this interval not displayed.   No results for input(s): LIPASE, AMYLASE in the last 168 hours. No results for input(s): AMMONIA in the last 168 hours. Coagulation Profile: Recent Labs  Lab 07/21/19 1221 07/22/19 0448 07/23/19 0450 07/24/19 0533 07/25/19 0606  INR 1.8* 1.7* 2.2* 2.1* 1.9*   Cardiac Enzymes: No results for input(s): CKTOTAL, CKMB, CKMBINDEX, TROPONINI in the last 168 hours. BNP (last 3 results) No results for input(s): PROBNP in the last 8760 hours. HbA1C: No results for input(s): HGBA1C in the last 72 hours. CBG: Recent Labs  Lab 07/26/19 1153 07/26/19 1626 07/26/19 2139 07/27/19 0828 07/27/19 1151  GLUCAP 130* 117* 151* 124* 119*   Lipid Profile: No results for input(s): CHOL, HDL, LDLCALC, TRIG, CHOLHDL, LDLDIRECT in the last 72 hours. Thyroid Function Tests: No results for input(s): TSH, T4TOTAL, FREET4, T3FREE, THYROIDAB in the last 72 hours. Anemia Panel: No results for input(s): VITAMINB12, FOLATE, FERRITIN, TIBC, IRON, RETICCTPCT in the last 72 hours. Sepsis Labs: Recent Labs  Lab 07/22/19 0918 07/22/19 1157  LATICACIDVEN 0.5 0.7    Recent Results (from the past 240 hour(s))  SARS CORONAVIRUS 2 (TAT 6-24 HRS) Nasopharyngeal     Status: None   Collection Time: 07/21/19  2:34 PM   Specimen: Nasopharyngeal  Result Value Ref Range Status   SARS Coronavirus 2 NEGATIVE NEGATIVE Final    Comment: (NOTE) SARS-CoV-2 target nucleic acids are NOT DETECTED. The SARS-CoV-2 RNA is generally detectable in upper and lower respiratory specimens during the acute phase of infection. Negative results do not preclude SARS-CoV-2 infection, do not rule out co-infections with other pathogens, and should not be used as the sole basis for treatment or other patient management decisions. Negative results must be combined with clinical observations, patient history, and epidemiological  information. The expected result is Negative. Fact Sheet for Patients: SugarRoll.be Fact Sheet for Healthcare Providers: https://www.woods-mathews.com/ This test is not yet approved or cleared by the [place] FDA and  has been authorized for detection and/or diagnosis of SARS-CoV-2 by FDA under an Emergency Use Authorization (EUA). This EUA will remain  in effect (meaning this test can be used) for the duration of the COVID-19 declaration under Section 56 4(b)(1) of the Act, 21 U.S.C. section 360bbb-3(b)(1), unless the authorization is terminated or revoked sooner. Performed at [facility], [place] [address] [place], [place] [address]  Radiology Studies: No results found.  Scheduled Meds: . amLODipine  10 mg Oral Daily  . apixaban  2.5 mg Oral BID  . atorvastatin  40 mg Oral q1800  . furosemide  20 mg Intravenous Daily  . hydrALAZINE  50 mg Oral Q8H  . insulin aspart  0-15 Units Subcutaneous TID WC  . insulin aspart  4 Units Subcutaneous TID WC  . levothyroxine  50 mcg Oral Q0600  . losartan  100 mg Oral Q24H  . mirtazapine  30 mg Oral QHS  . multivitamin with minerals  1 tablet Oral Daily  . sodium bicarbonate  650 mg Oral TID  . sodium chloride flush  3 mL Intravenous Once   Continuous Infusions: . sodium bicarbonate 150 mEq in dextrose 5% 1000 mL Stopped (07/26/19 1842)     LOS: 6 days   Time spent: 40 minutes.   Lorella Nimrod, MD [facility]  If 7PM-7AM, please contact night-coverage [email or web address]  07/27/2019, 2:42 PM   This record has been created using Systems analyst. Errors have been sought and corrected,but may not always be located. Such creation errors do not reflect on the standard of care.

## 2019-07-27 NOTE — Progress Notes (Signed)
[facility]  ROUNDING NOTE   Subjective:   Moved to 1A.   Taken off IV fluids. IV albumin yesterday.   Creatinine 1.96 (2) (2.06) (2.15)  Patient states she is feeling better   Objective:  Vital signs in last 24 hours:  Temp:  [98.3 F (36.8 C)-98.5 F (36.9 C)] 98.5 F (36.9 C) (03/15 0825) Pulse Rate:  [68-73] 73 (03/15 0825) Resp:  [16-17] 16 (03/15 0619) BP: (136-172)/(44-94) 172/53 (03/15 0825) SpO2:  [92 %-97 %] 94 % (03/15 0825)  Weight change:  Filed Weights   07/23/19 0603 07/24/19 0452 07/26/19 0407  Weight: 56.6 kg 62.2 kg 64.5 kg    Intake/Output: I/O last 3 completed shifts: In: 600 [P.O.:600] Out: 0    Intake/Output this shift:  Total I/O In: 120 [P.O.:120] Out: -   Physical Exam: General: NAD, laying in bed  Head: Normocephalic, atraumatic. Moist oral mucosal membranes  Eyes: Anicteric, PERRL  Neck: Supple, trachea midline  Lungs:  Clear to auscultation  Heart: Regular rate and rhythm  Abdomen:  Soft, nontender,   Extremities: + upper extremity peripheral edema. Left BKA  Neurologic: Nonfocal, moving all four extremities  Skin: No lesions        Basic Metabolic Panel: Recent Labs  Lab 07/23/19 0450 07/23/19 0450 07/24/19 0533 07/24/19 0533 07/25/19 0606 07/26/19 0559 07/27/19 0437  NA 132*  --  133*  --  132* 135 131*  K 4.4  --  4.4  --  4.5 4.6 4.5  CL 109  --  108  --  106 108 106  CO2 18*  --  18*  --  19* 21* 22  GLUCOSE 106*  --  136*  --  142* 99 129*  BUN 36*  --  38*  --  37* 38* 38*  CREATININE 2.11*  --  2.15*  --  2.06* 2.00* 1.96*  CALCIUM 7.6*   < > 7.6*   < > 7.4* 7.6* 7.6*  PHOS 4.9*  --  5.2*  --  4.9* 4.3 3.7   < > = values in this interval not displayed.    Liver Function Tests: Recent Labs  Lab 07/21/19 1221 07/21/19 1221 07/23/19 0450 07/24/19 0533 07/25/19 0606 07/26/19 0559 07/27/19 0437  AST 25  --   --   --   --   --   --   ALT 27  --   --   --   --   --   --   ALKPHOS 83  --    --   --   --   --   --   BILITOT 0.2*  --   --   --   --   --   --   PROT 5.7*  --   --   --   --   --   --   ALBUMIN 2.2*   < > 1.7* 1.9* 1.9* 1.9* 2.2*   < > = values in this interval not displayed.   No results for input(s): LIPASE, AMYLASE in the last 168 hours. No results for input(s): AMMONIA in the last 168 hours.  CBC: Recent Labs  Lab 07/22/19 0448 07/23/19 0450 07/24/19 0533 07/25/19 0606 07/26/19 0559  WBC 10.3 8.5 10.1 9.0 9.0  HGB 7.6* 7.2* 9.4* 9.2* 8.9*  HCT 23.1* 22.9* 28.7* 28.8* 27.4*  MCV 85.2 87.1 86.2 87.8 87.0  PLT 399 357 379 382 378    Cardiac Enzymes: No results for input(s): CKTOTAL, CKMB,  CKMBINDEX, TROPONINI in the last 168 hours.  BNP: Invalid input(s): POCBNP  CBG: Recent Labs  Lab 07/26/19 1153 07/26/19 1626 07/26/19 2139 07/27/19 0828 07/27/19 1151  GLUCAP 130* 117* 151* 124* 119*    Microbiology: Results for orders placed or performed during the hospital encounter of 07/21/19  SARS CORONAVIRUS 2 (TAT 6-24 HRS) Nasopharyngeal     Status: None   Collection Time: 07/21/19  2:34 PM   Specimen: Nasopharyngeal  Result Value Ref Range Status   SARS Coronavirus 2 NEGATIVE NEGATIVE Final    Comment: (NOTE) SARS-CoV-2 target nucleic acids are NOT DETECTED. The SARS-CoV-2 RNA is generally detectable in upper and lower respiratory specimens during the acute phase of infection. Negative results do not preclude SARS-CoV-2 infection, do not rule out co-infections with other pathogens, and should not be used as the sole basis for treatment or other patient management decisions. Negative results must be combined with clinical observations, patient history, and epidemiological information. The expected result is Negative. Fact Sheet for Patients: SugarRoll.be Fact Sheet for Healthcare Providers: https://www.woods-mathews.com/ This test is not yet approved or cleared by the [place] FDA and   has been authorized for detection and/or diagnosis of SARS-CoV-2 by FDA under an Emergency Use Authorization (EUA). This EUA will remain  in effect (meaning this test can be used) for the duration of the COVID-19 declaration under Section 56 4(b)(1) of the Act, 21 U.S.C. section 360bbb-3(b)(1), unless the authorization is terminated or revoked sooner. Performed at [facility], [place] [address] [place], [place] [address]     Coagulation Studies: Recent Labs    07/25/19 0606  LABPROT 21.8*  INR 1.9*    Urinalysis: No results for input(s): COLORURINE, LABSPEC, PHURINE, GLUCOSEU, HGBUR, BILIRUBINUR, KETONESUR, PROTEINUR, UROBILINOGEN, NITRITE, LEUKOCYTESUR in the last 72 hours.  Invalid input(s): APPERANCEUR    Imaging: US RENAL ARTERY DUPLEX COMPLETE  Result Date: 07/25/2019 CLINICAL DATA:  86 year old with uncontrolled hypertension. EXAM: RENAL/URINARY TRACT ULTRASOUND RENAL DUPLEX DOPPLER ULTRASOUND COMPARISON:  Renal ultrasound 07/22/2019 FINDINGS: Right Kidney: Length: 10.9. Echogenic cortex. No mass or hydronephrosis visualized. Again noted is an echogenic focus in the lower pole that measures up to 0.4 cm and likely represents a nonobstructive stone. Evidence for a small anechoic cyst in the lower pole measuring up to 1.7 cm. Left Kidney: Length: 11.5. Slightly increased echogenicity in the cortex. Evidence for small left renal cysts. No hydronephrosis. Bladder:  Normal appearance. RENAL DUPLEX ULTRASOUND Right Renal Artery Velocities: Origin:  161 cm/sec Mid:  217 cm/sec Hilum:  140 cm/sec Interlobar:  40 cm/sec Arcuate:  27 cm/sec Left Renal Artery Velocities: Origin:  73 cm/sec Mid:  163 cm/sec Hilum:  55 cm/sec Interlobar:  64 cm/sec Arcuate:  26 cm/sec Aortic Velocity:  137 cm/sec Right Renal-Aortic Ratios: Origin: 1.2 Mid:  1.6 Hilum: 1.0 Interlobar: 0.3 Arcuate: 0.2 Left Renal-Aortic Ratios: Origin: 0.5 Mid: 1.2 Hilum: 0.4 Interlobar: 0.5 Arcuate: 0.2 Bilateral renal  veins are patent. Bilateral pleural effusions. Ascites. IMPRESSION: 1. No evidence for hemodynamically significant renal artery stenosis. 2. Ascites and bilateral pleural effusions. 3. Increased echogenicity in both kidneys and suggestive for chronic medical renal disease. 4. Small bilateral renal cysts. 5. Possible nonobstructive right kidney stone. Electronically Signed   By: Markus Daft M.D.   On: 07/25/2019 15:07     Medications:   . sodium bicarbonate 150 mEq in dextrose 5% 1000 mL Stopped (07/26/19 1842)   . amLODipine  10 mg Oral Daily  . apixaban  2.5 mg Oral  BID  . atorvastatin  40 mg Oral q1800  . furosemide  20 mg Intravenous Daily  . hydrALAZINE  50 mg Oral Q8H  . insulin aspart  0-15 Units Subcutaneous TID WC  . insulin aspart  4 Units Subcutaneous TID WC  . levothyroxine  50 mcg Oral Q0600  . losartan  100 mg Oral Q24H  . mirtazapine  30 mg Oral QHS  . multivitamin with minerals  1 tablet Oral Daily  . sodium bicarbonate  650 mg Oral TID  . sodium chloride flush  3 mL Intravenous Once   acetaminophen **OR** acetaminophen, bisacodyl, hydrALAZINE, ondansetron **OR** ondansetron (ZOFRAN) IV  Assessment/ Plan:  Melanie Diaz is a 86 y.o. white female with diabetes mellitus type 2, hyperlipidemia, hypertension, chronic kidney disease stage IV baseline creatinine 1.75 with EGFR 27, who was admitted to [facility] 07/21/2019 for Bradycardia [R00.1] Weakness [R53.1] Arm swelling [M79.89] Symptomatic bradycardia [R00.1] AKI (acute kidney injury) ([place]) [N17.9]  1.  Acute kidney injury with metabolic acidosis on chronic kidney disease stage IV with proteinuria and glycosuria.  Baseline creatinine 1.75, GFR of 27 on 07/15/19.  Acute renal secondary to bactrim, acute prerenal azotemia Chronic kidney disease secondary to diabetic nephropathy Status post albumin on 3/14.  - IV furosemide   2.  Anemia of chronic kidney disease.  Hemoglobin 8.9 - no indication for EPO  3.  Hyponatremia: secondary to renal failure. Na 131. Secondary to volume overload.  - Furosemide as above.    LOS: 6 Devetta Hagenow 3/15/202112:11 PM

## 2019-07-27 NOTE — Progress Notes (Signed)
PT Cancellation Note  Patient Details Name: Melanie Diaz MRN: [id number] DOB: 1933-08-14   Cancelled Treatment:    Reason Eval/Treat Not Completed: Other (comment)(Pt reclined in bed, stated she did not feel good, nausea and feels like she is going to get sick. Declined OOB or bed mobility to adjust position, stated "I just want to be left alone". PT to re-attempt as able.)   Lieutenant Diego PT, DPT 9:34 AM,07/27/19

## 2019-07-28 ENCOUNTER — Encounter
Admission: EM | Disposition: A | Discharge status: Home Health | Payer: Self-pay | Source: Home / Self Care | Attending: Internal Medicine

## 2019-07-28 ENCOUNTER — Ambulatory Visit: Payer: Self-pay | Admitting: Pharmacist

## 2019-07-28 ENCOUNTER — Encounter: Payer: Self-pay | Admitting: Certified Registered Nurse Anesthetist

## 2019-07-28 ENCOUNTER — Other Ambulatory Visit: Payer: Self-pay | Admitting: Pharmacist

## 2019-07-28 DIAGNOSIS — Z515 Encounter for palliative care: Secondary | ICD-10-CM

## 2019-07-28 DIAGNOSIS — Z7189 Other specified counseling: Secondary | ICD-10-CM

## 2019-07-28 DIAGNOSIS — R627 Adult failure to thrive: Secondary | ICD-10-CM

## 2019-07-28 DIAGNOSIS — R531 Weakness: Secondary | ICD-10-CM

## 2019-07-28 LAB — CBC
HCT: 27.2 % — ABNORMAL LOW (ref 36.0–46.0)
Hemoglobin: 8.7 g/dL — ABNORMAL LOW (ref 12.0–15.0)
MCH: 28.3 pg (ref 26.0–34.0)
MCHC: 32 g/dL (ref 30.0–36.0)
MCV: 88.6 fL (ref 80.0–100.0)
Platelets: 385 10*3/uL (ref 150–400)
RBC: 3.07 MIL/uL — ABNORMAL LOW (ref 3.87–5.11)
RDW: 14.9 % (ref 11.5–15.5)
WBC: 9.3 10*3/uL (ref 4.0–10.5)
nRBC: 0 % (ref 0.0–0.2)

## 2019-07-28 LAB — PROTEIN ELECTROPHORESIS, SERUM
A/G Ratio: 0.8 (ref 0.7–1.7)
Albumin ELP: 1.8 g/dL — ABNORMAL LOW (ref 2.9–4.4)
Alpha-1-Globulin: 0.3 g/dL (ref 0.0–0.4)
Alpha-2-Globulin: 0.9 g/dL (ref 0.4–1.0)
Beta Globulin: 0.7 g/dL (ref 0.7–1.3)
Gamma Globulin: 0.5 g/dL (ref 0.4–1.8)
Globulin, Total: 2.4 g/dL (ref 2.2–3.9)
M-Spike, %: 0.1 g/dL — ABNORMAL HIGH
Total Protein ELP: 4.2 g/dL — ABNORMAL LOW (ref 6.0–8.5)

## 2019-07-28 LAB — GLUCOSE, CAPILLARY
Glucose-Capillary: 101 mg/dL — ABNORMAL HIGH (ref 70–99)
Glucose-Capillary: 139 mg/dL — ABNORMAL HIGH (ref 70–99)
Glucose-Capillary: 97 mg/dL (ref 70–99)

## 2019-07-28 LAB — RENAL FUNCTION PANEL
Albumin: 2.1 g/dL — ABNORMAL LOW (ref 3.5–5.0)
Anion gap: 6 (ref 5–15)
BUN: 37 mg/dL — ABNORMAL HIGH (ref 8–23)
CO2: 21 mmol/L — ABNORMAL LOW (ref 22–32)
Calcium: 7.5 mg/dL — ABNORMAL LOW (ref 8.9–10.3)
Chloride: 107 mmol/L (ref 98–111)
Creatinine, Ser: 1.93 mg/dL — ABNORMAL HIGH (ref 0.44–1.00)
GFR calc Af Amer: 27 mL/min — ABNORMAL LOW (ref >60)
GFR calc non Af Amer: 24 mL/min — ABNORMAL LOW (ref >60)
Glucose, Bld: 104 mg/dL — ABNORMAL HIGH (ref 70–99)
Phosphorus: 3.4 mg/dL (ref 2.5–4.6)
Potassium: 4.4 mmol/L (ref 3.5–5.1)
Sodium: 134 mmol/L — ABNORMAL LOW (ref 135–145)

## 2019-07-28 SURGERY — EGD (ESOPHAGOGASTRODUODENOSCOPY)
Anesthesia: General

## 2019-07-28 MED ORDER — HYDRALAZINE HCL 25 MG PO TABS: 75.0000 mg | ORAL_TABLET | Freq: Three times a day (TID) | ORAL | 0 refills | Status: DC

## 2019-07-28 MED ORDER — ONDANSETRON HCL 4 MG PO TABS: 4.0000 mg | ORAL_TABLET | Freq: Four times a day (QID) | ORAL | 0 refills | Status: DC | PRN

## 2019-07-28 MED ORDER — FUROSEMIDE 20 MG PO TABS: 20.0000 mg | ORAL_TABLET | Freq: Every day | ORAL | 11 refills | Status: AC

## 2019-07-28 MED ORDER — ESCITALOPRAM OXALATE 10 MG PO TABS
10.0000 mg | ORAL_TABLET | Freq: Every day | ORAL | Status: DC
  Administered 2019-07-28: 10 mg via ORAL
  Filled 2019-07-28: qty 1

## 2019-07-28 MED ORDER — PANTOPRAZOLE SODIUM 40 MG PO TBEC: 40.0000 mg | DELAYED_RELEASE_TABLET | Freq: Two times a day (BID) | ORAL | 0 refills | Status: DC

## 2019-07-28 MED ORDER — LOSARTAN POTASSIUM 100 MG PO TABS: 100.0000 mg | ORAL_TABLET | ORAL | 0 refills | Status: DC

## 2019-07-28 MED ORDER — ADULT MULTIVITAMIN W/MINERALS CH: 1.0000 | ORAL_TABLET | Freq: Every day | ORAL | 0 refills | Status: DC

## 2019-07-28 MED ORDER — BISACODYL 10 MG RE SUPP: 10.0000 mg | Freq: Every day | RECTAL | 0 refills | Status: AC | PRN

## 2019-07-28 MED ORDER — SODIUM BICARBONATE 650 MG PO TABS: 650.0000 mg | ORAL_TABLET | Freq: Three times a day (TID) | ORAL | 0 refills | Status: DC

## 2019-07-28 MED ORDER — SODIUM CHLORIDE 0.9 % IV SOLN: INTRAVENOUS | Status: DC

## 2019-07-28 MED ORDER — APIXABAN 2.5 MG PO TABS: 2.5000 mg | ORAL_TABLET | Freq: Two times a day (BID) | ORAL | 1 refills | Status: AC

## 2019-07-28 MED ORDER — LEVOTHYROXINE SODIUM 50 MCG PO TABS: 50.0000 ug | ORAL_TABLET | Freq: Every day | ORAL | 0 refills | Status: DC

## 2019-07-28 MED ORDER — PANTOPRAZOLE SODIUM 40 MG PO TBEC: 40.0000 mg | DELAYED_RELEASE_TABLET | Freq: Two times a day (BID) | ORAL | Status: DC

## 2019-07-28 NOTE — Progress Notes (Signed)
PT Cancellation Note  Patient Details Name: Melanie Diaz MRN: [id number] DOB: December 23, 1933   Cancelled Treatment:     PT attempt, pt refused. Max encouragement to participate however pt unwilling. " I just can't do it right now!" Therapist discussed d/c disposition and concerns with pt going home. Pt states, " I'll be fine once I get to my daughters." Therapist educated pt on safety concerns and barriers. She reports she will participate in PT this afternoon. Therapist will return later as time allows.   Willette Pa 07/28/2019, 11:31 AM

## 2019-07-28 NOTE — Patient Outreach (Signed)
[facility])  [place] Team    07/28/2019  Melanie Diaz 1933-10-13 [id number]  Reason for referral: Medication question on Eliquis from patient's daughter, Angie  Referral source: Dr. Alben Spittle office Current insurance: [facility]  Outreach:  Successful telephone call with patient's daughter.  HIPAA identifiers verified.  Daughter has spoken with CCM pharmacist at PCP office in the past regarding Eliquis.  Daughter reports that patient has been transitioned from warfarin to Eliquis during current hospitalization and she has questions about the cost.   We discussed patient assistance program for Eliquis and eligibility requirements including income 300% FPL and spending 3% of annual income on medication co-pays since May 15, 2019.  Daughter voiced understanding.  She reports patient is able to pay usual Tier 3 co-pay of $47 for now and she will keep track of out-of-pocket expenditure.   We also discussed [employer] OTC catalogue and possible quarterly benefits.  Daughter will call [employer] to request copy of catalogue and will attempt to make order before 1st quarter ends on March 31.    We reviewed Eliquis dosing and differences between Eliquis and warfarin.  Daughter denies any further questions or concerns.  She will reach out to PCP office if any further questions arise.   Ralene Bathe, PharmD, [place] [phone]

## 2019-07-28 NOTE — Progress Notes (Signed)
D: Pt alert and oriented.   A: Pt received discharge and medication education/information. Pt belongings were gathered and taken with pt upon discharge.   R: Pt verbalized understanding of discharge and medication education/information.  Pt escorted to medical mall front lobby by staff via wheelchair where daughter picked them up.   Discharge instructions were also reviewed with daughter prior to discharge

## 2019-07-28 NOTE — Consult Note (Signed)
Consultation Note Date: 07/28/2019   Patient Name: Melanie Diaz  DOB: 1934-01-18  MRN: [id number]  Age / Sex: 86 y.o., female  PCP: [name]., MD Referring Physician: Lorella Nimrod, MD  Reason for Consultation: Establishing goals of care  HPI/Patient Profile: 86 y.o. female  with past medical history of DVT on anticoagulation, HTN, T2DM, HTN, and depression admitted on 07/21/2019 with RUE swelling. Negative for DVT but found to have bradycardia likely secondary to beta blocker.  Also found to have AKI with CKD. She has had poor PO intake - GI recommended endoscopy but family felt risks outweighed benefits. PMT consulted for [place].  Clinical Assessment and Goals of Care: I have reviewed medical records including EPIC notes, labs and imaging, received report from RN, assessed the patient and then spoke with patient's daughter, Melanie Diaz,  to discuss diagnosis prognosis, GOC, EOL wishes, disposition and options.  I attempted to speak with patient regarding goals of care; however, she tells me she is too tired and to please speak with her daughter.   I introduced Palliative Medicine as specialized medical care for people living with serious illness. It focuses on providing relief from the symptoms and stress of a serious illness. The goal is to improve quality of life for both the patient and the family.  Daughter shares that patient has been living alone prior to admission. She shares that the patient has become weaker if recent weeks. She also shares that patient has never had a great appetite but this has also worsened in recent weeks. Prior to admission patient was able to transfer herself to and from her wheelchair. No cognitive issues.   We discussed her current illness and what it means in the larger context of her on-going co-morbidities.  Natural disease trajectory and expectations at EOL were discussed. Daughter tells me others have  alluded to patient's poor prognosis d/t her poor PO intake and she understands this.   Advance directives, concepts specific to code status, artifical feeding and hydration, and rehospitalization were considered and discussed. DNR was recommended and daughter seems to agree; however, this was not confirmed with patient and she was not interested in discussing further. Suggest repeat conversations with outpatient palliative care.   Hospice and Palliative Care services outpatient were explained and offered. Daughter is interested in palliative care outpatient.   Questions and concerns were addressed. The family was encouraged to call with questions or concerns.   Primary Decision Maker PATIENT    SUMMARY OF RECOMMENDATIONS   DNR recommended, daughter agrees, not discussed with patient as she refused and is now being discharged Palliative to follow outpatient  Code Status/Advance Care Planning:  Full code  Prognosis:   Unable to determine  Discharge Planning: Home with [place] and palliative     Primary Diagnoses: Present on Admission: . DM (diabetes mellitus), secondary ([place]) . Hypertension . DVT (deep venous thrombosis) ([place]) . Bradycardia . Dehydration . Symptomatic bradycardia . (Resolved) Peripheral blood vessel disorder (HCC) . PAD (peripheral artery disease) ([place]) . Depression   I have reviewed the medical record, interviewed the patient and family, and examined the patient. The following aspects are pertinent.  Past Medical History:  Diagnosis Date  . Depression   . Diabetes mellitus without complication ([place])   . History of deep vein thrombosis    about 30 years ago  . Hyperlipidemia 07/06/2005  . Hypertension    Social History   Socioeconomic History  . Marital status: Widowed  Spouse name: Not on file  . Number of children: 3  . Years of education: Not on file  . Highest education level: 12th grade  Occupational History  . Occupation: Retired   Tobacco Use  . Smoking status: Never Smoker  . Smokeless tobacco: Never Used  . Tobacco comment: smoking cessation materials not required  Substance and Sexual Activity  . Alcohol use: No  . Drug use: No  . Sexual activity: Not on file  Other Topics Concern  . Not on file  Social History Narrative   Lives at home by herself. Has a wheelchair. Daughter checks on her every day   Social Determinants of Health   Financial Resource Strain:   . Difficulty of Paying Living Expenses:   Food Insecurity:   . Worried About Charity fundraiser in the Last Year:   . Arboriculturist in the Last Year:   Transportation Needs:   . Film/video editor (Medical):   Marland Kitchen Lack of Transportation (Non-Medical):   Physical Activity: Inactive  . Days of Exercise per Week: 0 days  . Minutes of Exercise per Session: 0 min  Stress: No Stress Concern Present  . Feeling of Stress : Not at all  Social Connections: Unknown  . Frequency of Communication with Friends and Family: Not on file  . Frequency of Social Gatherings with Friends and Family: Not on file  . Attends Religious Services: Not on file  . Active Member of Clubs or Organizations: Not on file  . Attends Archivist Meetings: Not on file  . Marital Status: Patient refused   Family History  Problem Relation Age of Onset  . Kidney failure Mother   . Heart attack Sister        Hx of MI  . Diabetes Brother        borderline diabetes  . Cancer Maternal Aunt        Ovarian cancer  . Breast cancer Daughter    Scheduled Meds: . amLODipine  10 mg Oral Daily  . atorvastatin  40 mg Oral q1800  . furosemide  20 mg Intravenous Daily  . hydrALAZINE  75 mg Oral Q8H  . insulin aspart  0-15 Units Subcutaneous TID WC  . insulin aspart  4 Units Subcutaneous TID WC  . levothyroxine  50 mcg Oral Q0600  . losartan  100 mg Oral Q24H  . mirtazapine  30 mg Oral QHS  . multivitamin with minerals  1 tablet Oral Daily  . pantoprazole  40 mg Oral  BID  . sodium bicarbonate  650 mg Oral TID  . sodium chloride flush  3 mL Intravenous Once   Continuous Infusions: . sodium chloride 20 mL/hr at 07/28/19 0411  . sodium bicarbonate 150 mEq in dextrose 5% 1000 mL Stopped (07/26/19 1842)   PRN Meds:.acetaminophen **OR** acetaminophen, bisacodyl, hydrALAZINE, ondansetron **OR** ondansetron (ZOFRAN) IV Allergies  Allergen Reactions  . Amoxicillin Other (See Comments)    Has patient had a PCN reaction causing immediate rash, facial/tongue/throat swelling, SOB or lightheadedness with hypotension: Unknown Has patient had a PCN reaction causing severe rash involving mucus membranes or skin necrosis: Unknown Has patient had a PCN reaction that required hospitalization: Unknown Has patient had a PCN reaction occurring within the last 10 years: Unknown If all of the above answers are "NO", then may proceed with Cephalosporin use.   . Azithromycin     vomiting and stomach upset   Review of Systems  Unable to perform  ROS: Other  patient refused  Physical Exam Constitutional:      General: She is not in acute distress. Pulmonary:     Effort: Pulmonary effort is normal. No respiratory distress.  Skin:    General: Skin is warm and dry.  Neurological:     Mental Status: She is alert and oriented to person, place, and time.  Psychiatric:        Behavior: Behavior is withdrawn.     Vital Signs: BP (!) 155/44 (BP Location: Left Arm)   Pulse 63   Temp 99.1 F (37.3 C) (Oral)   Resp 17   Ht 5\' 2"  (1.575 m)   Wt 64.5 kg   SpO2 96%   BMI 26.03 kg/m  Pain Scale: 0-10   Pain Score: 0-No pain   SpO2: SpO2: 96 % O2 Device:SpO2: 96 % O2 Flow Rate: .O2 Flow Rate (L/min): 2 L/min  IO: Intake/output summary:   Intake/Output Summary (Last 24 hours) at 07/28/2019 1547 Last data filed at 07/28/2019 0000 Gross per 24 hour  Intake 560 ml  Output 750 ml  Net -190 ml    LBM: Last BM Date: 07/26/19 Baseline Weight: Weight: 54.4 kg Most  recent weight: Weight: 64.5 kg     Palliative Assessment/Data: PPS 40%    Time Total: 50 minutes Greater than 50%  of this time was spent counseling and coordinating care related to the above assessment and plan.  Juel Burrow, DNP, AGNP-C Palliative Medicine Team [phone] Pager: [phone]

## 2019-07-28 NOTE — Progress Notes (Deleted)
New referral for [facility] hospice home received from [facility] . Writer me tin the room with patient's on Melanie Diaz and daughter in law Lattie Haw to initiate education regarding hospice services, philosophy, team approach to care and current visitation policy with understanding voiced.  Questions answered. Melanie Diaz prefers to sign consents via docusign, plan is for patient to transfer to the hospice home this afternoon. Patient information given to referral. Hospital care team updated. Report called to the hospice home, EMS notified for [facility]. Signed out of facility DNR in place in discharge packet. Thank you for the opportunity to be involved in the care of this patient and her family. Flo Shanks BSN, RN, [place] [phone]

## 2019-07-28 NOTE — Progress Notes (Signed)
Melanie Darby, MD [address]  [place]  [place], [place] [address]  Main: [phone]  Fax: [phone] Pager: [phone]   Subjective: Patient was originally scheduled for upper endoscopy today.  However, patient and her daughter refused to undergo procedure because they felt she is too weak and risky to receive anesthesia.  Her symptoms remain unchanged   Objective: Vital signs in last 24 hours: Vitals:   07/28/19 0713 07/28/19 0753 07/28/19 1108 07/28/19 1423  BP: (!) 168/55 (!) 165/41 (!) 165/54 (!) 138/45  Pulse: 73 81 67 (!) 58  Resp:  17  16  Temp:  98.6 F (37 C)    TempSrc:  Oral    SpO2:  95%  96%  Weight:      Height:       Weight change:   Intake/Output Summary (Last 24 hours) at 07/28/2019 1456 Last data filed at 07/28/2019 0000 Gross per 24 hour  Intake 560 ml  Output 750 ml  Net -190 ml     Exam: Heart:: Regular rate and rhythm, S1S2 present or without murmur or extra heart sounds Lungs: normal and clear to auscultation Abdomen: soft, nontender, normal bowel sounds   Lab Results: CBC Latest Ref Rng & Units 07/28/2019 07/26/2019 07/25/2019  WBC 4.0 - 10.5 K/uL 9.3 9.0 9.0  Hemoglobin 12.0 - 15.0 g/dL 8.7(L) 8.9(L) 9.2(L)  Hematocrit 36.0 - 46.0 % 27.2(L) 27.4(L) 28.8(L)  Platelets 150 - 400 K/uL 385 378 382   CMP Latest Ref Rng & Units 07/28/2019 07/27/2019 07/26/2019  Glucose 70 - 99 mg/dL 104(H) 129(H) 99  BUN 8 - 23 mg/dL 37(H) 38(H) 38(H)  Creatinine 0.44 - 1.00 mg/dL 1.93(H) 1.96(H) 2.00(H)  Sodium 135 - 145 mmol/L 134(L) 131(L) 135  Potassium 3.5 - 5.1 mmol/L 4.4 4.5 4.6  Chloride 98 - 111 mmol/L 107 106 108  CO2 22 - 32 mmol/L 21(L) 22 21(L)  Calcium 8.9 - 10.3 mg/dL 7.5(L) 7.6(L) 7.6(L)  Total Protein 6.5 - 8.1 g/dL - - -  Total Bilirubin 0.3 - 1.2 mg/dL - - -  Alkaline Phos 38 - 126 U/L - - -  AST 15 - 41 U/L - - -  ALT 0 - 44 U/L - - -    Micro Results: Recent Results (from the past 240 hour(s))  SARS CORONAVIRUS 2  (TAT 6-24 HRS) Nasopharyngeal     Status: None   Collection Time: 07/21/19  2:34 PM   Specimen: Nasopharyngeal  Result Value Ref Range Status   SARS Coronavirus 2 NEGATIVE NEGATIVE Final    Comment: (NOTE) SARS-CoV-2 target nucleic acids are NOT DETECTED. The SARS-CoV-2 RNA is generally detectable in upper and lower respiratory specimens during the acute phase of infection. Negative results do not preclude SARS-CoV-2 infection, do not rule out co-infections with other pathogens, and should not be used as the sole basis for treatment or other patient management decisions. Negative results must be combined with clinical observations, patient history, and epidemiological information. The expected result is Negative. Fact Sheet for Patients: SugarRoll.be Fact Sheet for Healthcare Providers: https://www.woods-mathews.com/ This test is not yet approved or cleared by the [place] FDA and  has been authorized for detection and/or diagnosis of SARS-CoV-2 by FDA under an Emergency Use Authorization (EUA). This EUA will remain  in effect (meaning this test can be used) for the duration of the COVID-19 declaration under Section 56 4(b)(1) of the Act, 21 U.S.C. section 360bbb-3(b)(1), unless the authorization is terminated or revoked sooner.  Performed at [facility], [place] [address], [place], [place] [address]    Studies/Results: No results found. Medications:  I have reviewed the patient's current medications. Prior to Admission:  Medications Prior to Admission  Medication Sig Dispense Refill Last Dose  . amLODipine (NORVASC) 10 MG tablet Take 1 tablet (10 mg total) by mouth daily. 90 tablet 0 07/21/2019 at 1000  . atorvastatin (LIPITOR) 40 MG tablet Take 1 tablet (40 mg total) by mouth daily at 6 PM. 30 tablet 5 07/20/2019 at 2200  . glipiZIDE (GLUCOTROL) 10 MG tablet Take 1 tablet (10 mg total) by mouth daily. 30 tablet 5 07/21/2019 at 1000   . losartan (COZAAR) 50 MG tablet Take 1 tablet (50 mg total) by mouth daily. 30 tablet 5 07/21/2019 at 1000  . metoprolol tartrate (LOPRESSOR) 25 MG tablet Take 25 mg by mouth every morning.   07/21/2019 at 1000  . metoprolol tartrate (LOPRESSOR) 50 MG tablet Take 1 tablet (50 mg total) by mouth 2 (two) times daily. (Patient taking differently: Take 50 mg by mouth every evening. ) 60 tablet 5 07/20/2019 at 2200  . mirtazapine (REMERON) 30 MG tablet Take 1 tablet (30 mg total) by mouth at bedtime. 30 tablet 5   . warfarin (COUMADIN) 2 MG tablet Take 1 tablet (2 mg total) by mouth daily. 30 tablet 5 07/20/2019 at 2200  . sulfamethoxazole-trimethoprim (BACTRIM) 400-80 MG tablet Take 1 tablet by mouth 2 (two) times daily. (Patient not taking: Reported on 07/21/2019) 14 tablet 0 Completed Course at Unknown time   Scheduled: . amLODipine  10 mg Oral Daily  . atorvastatin  40 mg Oral q1800  . furosemide  20 mg Intravenous Daily  . hydrALAZINE  75 mg Oral Q8H  . insulin aspart  0-15 Units Subcutaneous TID WC  . insulin aspart  4 Units Subcutaneous TID WC  . levothyroxine  50 mcg Oral Q0600  . losartan  100 mg Oral Q24H  . mirtazapine  30 mg Oral QHS  . multivitamin with minerals  1 tablet Oral Daily  . pantoprazole  40 mg Oral BID  . sodium bicarbonate  650 mg Oral TID  . sodium chloride flush  3 mL Intravenous Once   Continuous: . sodium chloride 20 mL/hr at 07/28/19 0411  . sodium bicarbonate 150 mEq in dextrose 5% 1000 mL Stopped (07/26/19 1842)   ASN:[id number] **OR** acetaminophen, bisacodyl, hydrALAZINE, ondansetron **OR** ondansetron (ZOFRAN) IV Anti-infectives (From admission, onward)   None     Scheduled Meds: . amLODipine  10 mg Oral Daily  . atorvastatin  40 mg Oral q1800  . furosemide  20 mg Intravenous Daily  . hydrALAZINE  75 mg Oral Q8H  . insulin aspart  0-15 Units Subcutaneous TID WC  . insulin aspart  4 Units Subcutaneous TID WC  . levothyroxine  50 mcg Oral Q0600  .  losartan  100 mg Oral Q24H  . mirtazapine  30 mg Oral QHS  . multivitamin with minerals  1 tablet Oral Daily  . pantoprazole  40 mg Oral BID  . sodium bicarbonate  650 mg Oral TID  . sodium chloride flush  3 mL Intravenous Once   Continuous Infusions: . sodium chloride 20 mL/hr at 07/28/19 0411  . sodium bicarbonate 150 mEq in dextrose 5% 1000 mL Stopped (07/26/19 1842)   PRN Meds:.acetaminophen **OR** acetaminophen, bisacodyl, hydrALAZINE, ondansetron **OR** ondansetron (ZOFRAN) IV   Assessment: Principal Problem:   Bradycardia Active Problems:   DM (diabetes mellitus), secondary ([place])  Depression   Hypertension   DVT (deep venous thrombosis) (H[facility])   Dehydration   Generalized weakness   History of anemia due to chronic kidney disease   Symptomatic bradycardia   PAD (peripheral artery disease) (H[facility])   Malnutrition of moderate degree   Plan: Patient refused to undergo endoscopic evaluation Diet as tolerated, encourage high-protein diet Consider to switch from Remeron to Zyprexa as reports nausea Treat underlying depression Patient is being discharged home today Follow-up with GI as outpatient   LOS: 7 days   Meshell Abdulaziz 07/28/2019, 2:56 PM

## 2019-07-28 NOTE — Consult Note (Signed)
[place] for Apixaban Indication: Hx of DVT  Patient Measurements: Height: 5\' 2"  (157.5 cm) Weight: 142 lb 4.8 oz (64.5 kg) IBW/kg (Calculated) : 50.1  Vital Signs: Temp: 98.6 F (37 C) (03/16 0753) Temp Source: Oral (03/16 0753) BP: 165/54 (03/16 1108) Pulse Rate: 67 (03/16 1108)  Labs: Recent Labs    07/26/19 0559 07/27/19 0437 07/28/19 0340  HGB 8.9*  --  8.7*  HCT 27.4*  --  27.2*  PLT 378  --  385  CREATININE 2.00* 1.96* 1.93*    Estimated Creatinine Clearance: 19.5 mL/min (A) (by C-G formula based on SCr of 1.93 mg/dL (H)).   Medical History: Past Medical History:  Diagnosis Date  . Depression   . Diabetes mellitus without complication ([place])   . History of deep vein thrombosis    about 30 years ago  . Hyperlipidemia 07/06/2005  . Hypertension     Medications:  Transitioning warfarin to apixaban per patient preference. Will need to confirm patient is able to afford apixaban.  Assessment: Patient is an 86 y/o F with a history of DVT on chronic anticoagulation with warfarin who is admitted for symptomatic bradycardia. Pharmacy has been consulted to convert warfarin to apixaban.  Her last hemoglobin was 9.4 after 1 unit of PRBC.  No bleeding noted  Goal of Therapy:  INR 2-3 Monitor platelets by anticoagulation protocol: Yes   Plan:   Warfarin has been stopped: last dose 03/11; INR now 1.9 this morning  HOLD apixaban 2.5 mg twice today for procedure  CBC per protocol  Melanie Diaz  07/28/2019,1:31 PM

## 2019-07-28 NOTE — TOC Transition Note (Signed)
Transition of Care [facility]) - CM/SW Discharge Note   Patient Details  Name: Melanie Diaz MRN: [id number] Date of Birth: Jul 08, 1933  Transition of Care [facility] CM/SW Contact:  Elease Hashimoto, LCSW Phone Number: 07/28/2019, 2:26 PM   Clinical Narrative:   Daughter reports hospital bed to be delivered in one hour. MD plans on discharging today. Daughter aware pt requires 24 hr care and has the ability to provide this with other family members. She has all other equipment at home. [facility] to provide follow up, daughter is aware of this. No further follow due to DC today.    Final next level of care: [place] Barriers to Discharge: Barriers Resolved   Patient Goals and CMS Choice Patient states their goals for this hospitalization and ongoing recovery are:: "Plan to go to my daughter's at discharge" CMS Medicare.gov Compare Post Acute Care list provided to:: Patient Choice offered to / list presented to : Patient, Adult Children(Angie)  Discharge Placement                Patient to be transferred to facility by: Daughter to transport home Name of family member notified: Angie Patient and family notified of of transfer: 07/28/19  Discharge Plan and Services                DME Arranged: Hospital bed DME Agency: [employer] Date DME Agency Contacted: 07/27/19 Time DME Agency Contacted: 1000 Representative spoke with at DME Agency: [place]: PT, OT, RN [place] Agency: [facility] (now [facility]) Date [place]: 07/27/19 Time [place]: 1210 Representative spoke with at [place]: teresa  Social Determinants of Health ([place]) Interventions     Readmission Risk Interventions No flowsheet data found.

## 2019-07-28 NOTE — Progress Notes (Signed)
New referral for [facility] community Palliative program to follow post discharge received from Palliative NP Kathie Rhodes. TOC Becky Dupree aware. Plan is for discharge home today. Patient information given to referral. Flo Shanks BSN, RN, [place] [phone]

## 2019-07-28 NOTE — Progress Notes (Signed)
[facility]  ROUNDING NOTE   Subjective:   Refused her endoscopy this morning.   Patient continues to worry about her peripheral edema.   Continues to have nausea.    Objective:  Vital signs in last 24 hours:  Temp:  [98.3 F (36.8 C)-98.6 F (37 C)] 98.6 F (37 C) (03/16 0753) Pulse Rate:  [62-81] 67 (03/16 1108) Resp:  [16-17] 17 (03/16 0753) BP: (148-175)/(32-55) 165/54 (03/16 1108) SpO2:  [93 %-97 %] 95 % (03/16 0753)  Weight change:  Filed Weights   07/23/19 0603 07/24/19 0452 07/26/19 0407  Weight: 56.6 kg 62.2 kg 64.5 kg    Intake/Output: I/O last 3 completed shifts: In: 680 [P.O.:680] Out: 750 [Urine:750]   Intake/Output this shift:  No intake/output data recorded.  Physical Exam: General: NAD, laying in bed  Head: Normocephalic, atraumatic. Moist oral mucosal membranes  Eyes: Anicteric, PERRL  Neck: Supple, trachea midline  Lungs:  Clear to auscultation  Heart: Regular rate and rhythm  Abdomen:  Soft, nontender,   Extremities: + upper extremity peripheral edema. Left BKA  Neurologic: Nonfocal, moving all four extremities  Skin: No lesions        Basic Metabolic Panel: Recent Labs  Lab 07/24/19 0533 07/24/19 0533 07/25/19 0606 07/25/19 0606 07/26/19 0559 07/27/19 0437 07/28/19 0340  NA 133*  --  132*  --  135 131* 134*  K 4.4  --  4.5  --  4.6 4.5 4.4  CL 108  --  106  --  108 106 107  CO2 18*  --  19*  --  21* 22 21*  GLUCOSE 136*  --  142*  --  99 129* 104*  BUN 38*  --  37*  --  38* 38* 37*  CREATININE 2.15*  --  2.06*  --  2.00* 1.96* 1.93*  CALCIUM 7.6*   < > 7.4*   < > 7.6* 7.6* 7.5*  PHOS 5.2*  --  4.9*  --  4.3 3.7 3.4   < > = values in this interval not displayed.    Liver Function Tests: Recent Labs  Lab 07/21/19 1221 07/23/19 0450 07/24/19 0533 07/25/19 0606 07/26/19 0559 07/27/19 0437 07/28/19 0340  AST 25  --   --   --   --   --   --   ALT 27  --   --   --   --   --   --   ALKPHOS 83  --   --   --    --   --   --   BILITOT 0.2*  --   --   --   --   --   --   PROT 5.7*  --   --   --   --   --   --   ALBUMIN 2.2*   < > 1.9* 1.9* 1.9* 2.2* 2.1*   < > = values in this interval not displayed.   No results for input(s): LIPASE, AMYLASE in the last 168 hours. No results for input(s): AMMONIA in the last 168 hours.  CBC: Recent Labs  Lab 07/23/19 0450 07/24/19 0533 07/25/19 0606 07/26/19 0559 07/28/19 0340  WBC 8.5 10.1 9.0 9.0 9.3  HGB 7.2* 9.4* 9.2* 8.9* 8.7*  HCT 22.9* 28.7* 28.8* 27.4* 27.2*  MCV 87.1 86.2 87.8 87.0 88.6  PLT 357 379 382 378 385    Cardiac Enzymes: No results for input(s): CKTOTAL, CKMB, CKMBINDEX, TROPONINI in the last 168 hours.  BNP: Invalid input(s): POCBNP  CBG: Recent Labs  Lab 07/27/19 0828 07/27/19 1151 07/27/19 1557 07/27/19 2112 07/28/19 0756  GLUCAP 124* 119* 148* 120* 101*    Microbiology: Results for orders placed or performed during the hospital encounter of 07/21/19  SARS CORONAVIRUS 2 (TAT 6-24 HRS) Nasopharyngeal     Status: None   Collection Time: 07/21/19  2:34 PM   Specimen: Nasopharyngeal  Result Value Ref Range Status   SARS Coronavirus 2 NEGATIVE NEGATIVE Final    Comment: (NOTE) SARS-CoV-2 target nucleic acids are NOT DETECTED. The SARS-CoV-2 RNA is generally detectable in upper and lower respiratory specimens during the acute phase of infection. Negative results do not preclude SARS-CoV-2 infection, do not rule out co-infections with other pathogens, and should not be used as the sole basis for treatment or other patient management decisions. Negative results must be combined with clinical observations, patient history, and epidemiological information. The expected result is Negative. Fact Sheet for Patients: SugarRoll.be Fact Sheet for Healthcare Providers: https://www.woods-mathews.com/ This test is not yet approved or cleared by the [place] FDA and  has been  authorized for detection and/or diagnosis of SARS-CoV-2 by FDA under an Emergency Use Authorization (EUA). This EUA will remain  in effect (meaning this test can be used) for the duration of the COVID-19 declaration under Section 56 4(b)(1) of the Act, 21 U.S.C. section 360bbb-3(b)(1), unless the authorization is terminated or revoked sooner. Performed at [facility], [place] [address], [place], [place] [address]     Coagulation Studies: No results for input(s): LABPROT, INR in the last 72 hours.  Urinalysis: No results for input(s): COLORURINE, LABSPEC, PHURINE, GLUCOSEU, HGBUR, BILIRUBINUR, KETONESUR, PROTEINUR, UROBILINOGEN, NITRITE, LEUKOCYTESUR in the last 72 hours.  Invalid input(s): APPERANCEUR    Imaging: No results found.   Medications:   . sodium chloride 20 mL/hr at 07/28/19 0411  . sodium bicarbonate 150 mEq in dextrose 5% 1000 mL Stopped (07/26/19 1842)   . amLODipine  10 mg Oral Daily  . atorvastatin  40 mg Oral q1800  . furosemide  20 mg Intravenous Daily  . hydrALAZINE  75 mg Oral Q8H  . insulin aspart  0-15 Units Subcutaneous TID WC  . insulin aspart  4 Units Subcutaneous TID WC  . levothyroxine  50 mcg Oral Q0600  . losartan  100 mg Oral Q24H  . mirtazapine  30 mg Oral QHS  . multivitamin with minerals  1 tablet Oral Daily  . sodium bicarbonate  650 mg Oral TID  . sodium chloride flush  3 mL Intravenous Once   acetaminophen **OR** acetaminophen, bisacodyl, hydrALAZINE, ondansetron **OR** ondansetron (ZOFRAN) IV  Assessment/ Plan:  [name] is a 86 y.o. white female with diabetes mellitus type 2, hyperlipidemia, hypertension, chronic kidney disease stage IV baseline creatinine 1.75 with EGFR 27, who was admitted to [facility] 07/21/2019 for Bradycardia [R00.1] Weakness [R53.1] Arm swelling [M79.89] Symptomatic bradycardia [R00.1] AKI (acute kidney injury) ([place]) [N17.9]  1.  Acute kidney injury with metabolic acidosis on chronic kidney  disease stage IV with proteinuria and glycosuria.  Baseline creatinine 1.75, GFR of 27 on 07/15/19.  Acute renal secondary to bactrim, acute prerenal azotemia Chronic kidney disease secondary to diabetic nephropathy Status post albumin on 3/14.  - IV furosemide   2.  Anemia of chronic kidney disease.  Hemoglobin 8.7 - no indication for EPO  3. Hyponatremia: secondary to renal failure. Na 134. Secondary to volume overload.  - Furosemide as above.   4.  Hypertension: elevated 165/54.  - amlodipine, losartan and furosemide.    LOS: 7 Rainn Zupko 3/16/202111:28 AM

## 2019-07-28 NOTE — Discharge Summary (Signed)
Physician Discharge Summary  Melanie Diaz XTA:[id number] DOB: 12/02/1933 DOA: 07/21/2019  PCP: [name]., MD  Admit date: 07/21/2019 Discharge date: 07/28/2019  Admitted From: Home Disposition: Home  Recommendations for Outpatient Follow-up:  1. Follow up with PCP in 1-2 weeks 2. Follow-up with nephrology in 2 weeks. 3. Follow-up with gastroenterology in 1 to 2 weeks. 4. Follow-up with cardiology. 5. Please obtain BMP/CBC in one week 6. Please follow up on the following pending results: None  Home Health: Yes Equipment/Devices: Hospital bed, wheelchair, bedside commode. Discharge Condition: Guarded but stable.  Patient is high risk for deterioration and readmission. CODE STATUS: Full-ongoing palliative care discussion. Diet recommendation: Heart Healthy / Carb Modified   Brief/Interim Summary: Melanie Diaz Brightis a 86 y.o.femalewith medical history significant forDVT on anticoagulation,hypertension,diabetes mellitus and depression.Patient was recently treated for UTI with Bactrim.Brought to emergency room by family members for evaluation of increased swelling involving her right upper extremity. Venous Doppler studies were negative for DVT but she found to have bradycardia.  Bradycardia. Likely secondary to beta-blocker.  Apparently patient was on 2 separate doses of metoprolol 25 mg twice daily and 50 mg twice daily, not sure what she was taking.  Bradycardia improved after holding beta-blocker. Echocardiogram with grade 1 diastolic dysfunction, without any regional wall motion abnormalities, normal EF.  Mildly elevated BNP at 668.  Patient does not appear volume overload, has CKD. TSH elevated at 7.9 with free T4 within normal limit, free T3 decreased. She will continue holding metoprolol until she will see her cardiologist and they can adjust or restart if needed. She was also started on Synthroid will need a repeat TSH for further titration of the dose in 3 to  4 weeks which can be done at her PCP office.  AKI with CKD stage IIIb.  She developed AKI with her current CKD stage IIIb associated with hyponatremia and non-anion gap metabolic acidosis.  Thought to be due to prerenal secondary to bradycardia, recent Bactrim use.  Nephrology was consulted and she was given gentle IV fluids with resulted in some improvement in her creatinine.  Patient with very poor p.o. intake.  Encouraged to keep her well-hydrated.  She needs to follow-up with nephrology as an outpatient.  Dyspepsia/nausea with the site of food.  Patient with very poor p.o. intake and continue to complain of becoming nauseated with the site of food.  No obvious sign of aspiration or difficulty swallowing.  GI was consulted and they want to do EGD but refused by patient and her daughter.  She never had any endoscopy or colonoscopy before.  They were advised to follow-up with gastroenterology as an outpatient.  Anemia of chronic disease.  Patient developed worsening anemia with no sign of bleeding.  Thought to be due to her chronic kidney disease.  She did received 1 unit of packed RBC due to her complaint of worsening weakness and lethargy.  She was also given IV iron as anemia panel did show some iron deficiency. She should continue with iron supplement.  Generalized weakness.  Be related to her dehydration, bradycardia poor p.o. intake. hypothyroidism  can be contribute to this complaint.  Worsening renal function and anemia can be contributory.  It also appears depressed and there is a recent death in her loved ones.  She was started on Remeron to help with her depression and stimulate appetite.  She will need outpatient psych consult if continue with the symptoms. PT/OT evaluation was done and they were recommending SNF placement  which was declined by patient and her daughter.  Patient decided to go back to her daughter's home with the maximum home health services.  Hospital bed, bedside commode and  wheelchair along with home health PT and OT was ordered. Palliative was consulted due to her persistent decline and refusing p.o. intake. Palliative will continue their goals of life discussion and outpatient support.  Patient is currently full code. She did had severe protein caloric malnutrition and hypoalbuminemia.  Nutrition consult was obtained and they placed the recommendations.  She also received some albumin by nephrology.  Hypertension.  Blood pressure remained elevated.  We increased her home dose of losartan 200 mg daily and added hydralazine 75 mg 3 times a day with some improvement.  She was also given Lasix 20 mg daily for her lower extremity edema which should also help with the blood pressure.  Type 2 diabetes.  Well-controlled with A1c of 5.9.  She will continue her home dose of glipizide on discharge.  Right upper extremity edema with history of DVT.  Resolved.  Venous Doppler negative for VTE.  She was on Coumadin which was converted to Eliquis yesterday at their request.   Discharge Diagnoses:  Principal Problem:   Bradycardia Active Problems:   DM (diabetes mellitus), secondary ([place])   Depression   Hypertension   DVT (deep venous thrombosis) (HCC)   Dehydration   Generalized weakness   History of anemia due to chronic kidney disease   Symptomatic bradycardia   PAD (peripheral artery disease) (HCC)   Malnutrition of moderate degree  Discharge Instructions  Discharge Instructions    Diet - low sodium heart healthy   Complete by: As directed    Discharge instructions   Complete by: As directed    It was pleasure taking care of you. I hope you feel better soon.  Please follow-up with your primary care physician, cardiologist, nephrologist and gastroenterologist. We have make changes to your current medications.  We changed Coumadin with Eliquis at your request.  We added thyroid replacement because of your low levels and your thyroid level needs to be checked in  3 to 4 weeks for further titration of dose.  We added another medicine hydralazine for your persistently elevated blood pressure.  You can also take a low-dose water pill for your lower extremity swelling. Please keep yourself well-hydrated.   Increase activity slowly   Complete by: As directed      Allergies as of 07/28/2019      Reactions   Amoxicillin Other (See Comments)   Has patient had a PCN reaction causing immediate rash, facial/tongue/throat swelling, SOB or lightheadedness with hypotension: Unknown Has patient had a PCN reaction causing severe rash involving mucus membranes or skin necrosis: Unknown Has patient had a PCN reaction that required hospitalization: Unknown Has patient had a PCN reaction occurring within the last 10 years: Unknown If all of the above answers are "NO", then may proceed with Cephalosporin use.   Azithromycin    vomiting and stomach upset      Medication List    STOP taking these medications   metoprolol tartrate 25 MG tablet Commonly known as: LOPRESSOR   metoprolol tartrate 50 MG tablet Commonly known as: LOPRESSOR   sulfamethoxazole-trimethoprim 400-80 MG tablet Commonly known as: Bactrim   warfarin 2 MG tablet Commonly known as: COUMADIN     TAKE these medications   amLODipine 10 MG tablet Commonly known as: NORVASC Take 1 tablet (10 mg total) by mouth daily.  apixaban 2.5 MG Tabs tablet Commonly known as: ELIQUIS Take 1 tablet (2.5 mg total) by mouth 2 (two) times daily.   atorvastatin 40 MG tablet Commonly known as: LIPITOR Take 1 tablet (40 mg total) by mouth daily at 6 PM.   bisacodyl 10 MG suppository Commonly known as: DULCOLAX Place 1 suppository (10 mg total) rectally daily as needed for moderate constipation.   furosemide 20 MG tablet Commonly known as: Lasix Take 1 tablet (20 mg total) by mouth daily.   glipiZIDE 10 MG tablet Commonly known as: GLUCOTROL Take 1 tablet (10 mg total) by mouth daily.    hydrALAZINE 25 MG tablet Commonly known as: APRESOLINE Take 3 tablets (75 mg total) by mouth every 8 (eight) hours.   levothyroxine 50 MCG tablet Commonly known as: SYNTHROID Take 1 tablet (50 mcg total) by mouth daily at 6 (six) AM. Start taking on: July 29, 2019   losartan 100 MG tablet Commonly known as: COZAAR Take 1 tablet (100 mg total) by mouth daily. What changed:   medication strength  how much to take  when to take this   mirtazapine 30 MG tablet Commonly known as: Remeron Take 1 tablet (30 mg total) by mouth at bedtime.   multivitamin with minerals Tabs tablet Take 1 tablet by mouth daily. Start taking on: July 29, 2019   ondansetron 4 MG tablet Commonly known as: ZOFRAN Take 1 tablet (4 mg total) by mouth every 6 (six) hours as needed for nausea.   pantoprazole 40 MG tablet Commonly known as: PROTONIX Take 1 tablet (40 mg total) by mouth 2 (two) times daily.   sodium bicarbonate 650 MG tablet Take 1 tablet (650 mg total) by mouth 3 (three) times daily.            Durable Medical Equipment  (From admission, onward)         Start     Ordered   07/26/19 1336  For home use only DME Hospital bed  Once    Question Answer Comment  Length of Need 12 Months   Patient has (list medical condition): CKD, anemia, generalized weakness, heart failure   The above medical condition requires: Patient requires the ability to reposition frequently   Head must be elevated greater than: 30 degrees   Bed type Semi-electric   Support Surface: Gel Overlay      07/26/19 1336         Follow-up Information    Vanga, Tally Due, MD. Schedule an appointment as soon as possible for a visit.   Specialty: Gastroenterology Contact information: [place] [address] [phone]        [name]., MD. Schedule an appointment as soon as possible for a visit.   Specialty: Family Medicine Contact information: [address] [place] [address] [phone]        Anthonette Legato, MD. Schedule an appointment as soon as possible for a visit.   Specialty: Nephrology Contact information: [place] [address] [phone]          Allergies  Allergen Reactions  . Amoxicillin Other (See Comments)    Has patient had a PCN reaction causing immediate rash, facial/tongue/throat swelling, SOB or lightheadedness with hypotension: Unknown Has patient had a PCN reaction causing severe rash involving mucus membranes or skin necrosis: Unknown Has patient had a PCN reaction that required hospitalization: Unknown Has patient had a PCN reaction occurring within the  last 10 years: Unknown If all of the above answers are "NO", then may proceed with Cephalosporin use.   . Azithromycin     vomiting and stomach upset    Consultations:  Cardiology  Nephrology  Gastroenterology  Procedures/Studies: US RENAL  Result Date: 07/22/2019 CLINICAL DATA:  Acute kidney injury EXAM: RENAL / URINARY TRACT ULTRASOUND COMPLETE COMPARISON:  None. FINDINGS: Right Kidney: Renal measurements: 11 x 5.3 x 5.5 cm = volume: 166 mL. Cortex is echogenic. No hydronephrosis. Cyst in the lower pole measuring 15 mm. Shadowing echogenic focus in the mid to lower pole of right kidney may reflect a small stone. Left Kidney: Renal measurements: 11.7 x 5.6 x 5.3 cm = volume: 166 mL. Cortex is echogenic. No hydronephrosis. Cyst in the midpole measuring 11 mm. Bladder: Appears normal for degree of bladder distention. Other: Bilateral pleural effusions are noted. Small amount of abdominal ascites within the 4 quadrants IMPRESSION: 1. Echogenic kidneys consistent with medical renal disease. No hydronephrosis. 2. Possible 5 mm stone in the lower pole right kidney 3. Bilateral pleural effusions and small amount of ascites Electronically Signed   By: Donavan Foil M.D.   On: 07/22/2019 15:28   US Venous Img Upper Uni  Right(DVT)  Result Date: 07/21/2019 CLINICAL DATA:  Right upper extremity edema. EXAM: RIGHT UPPER EXTREMITY VENOUS DOPPLER ULTRASOUND TECHNIQUE: Gray-scale sonography with graded compression, as well as color Doppler and duplex ultrasound were performed to evaluate the upper extremity deep venous system from the level of the subclavian vein and including the jugular, axillary, basilic, radial, ulnar and upper cephalic vein. Spectral Doppler was utilized to evaluate flow at rest and with distal augmentation maneuvers. COMPARISON:  None. FINDINGS: Contralateral Subclavian Vein: Respiratory phasicity is normal and symmetric with the symptomatic side. No evidence of thrombus. Normal compressibility. Internal Jugular Vein: No evidence of thrombus. Normal compressibility, respiratory phasicity and response to augmentation. Subclavian Vein: No evidence of thrombus. Normal compressibility, respiratory phasicity and response to augmentation. Axillary Vein: No evidence of thrombus. Normal compressibility, respiratory phasicity and response to augmentation. Cephalic Vein: No evidence of thrombus. Normal compressibility, respiratory phasicity and response to augmentation. Basilic Vein: No evidence of thrombus. Normal compressibility, respiratory phasicity and response to augmentation. Brachial Veins: No evidence of thrombus. Normal compressibility, respiratory phasicity and response to augmentation. Radial Veins: No evidence of thrombus. Normal compressibility, respiratory phasicity and response to augmentation. Ulnar Veins: No evidence of thrombus. Normal compressibility, respiratory phasicity and response to augmentation. Venous Reflux:  None visualized. Other Findings: No evidence of superficial thrombophlebitis or abnormal fluid collection. IMPRESSION: No evidence of DVT within the right upper extremity. Electronically Signed   By: Aletta Edouard M.D.   On: 07/21/2019 14:40   DG Chest Portable 1 View  Result Date:  07/21/2019 CLINICAL DATA:  Weakness, diabetes, hypertension EXAM: PORTABLE CHEST 1 VIEW COMPARISON:  08/18/2012 FINDINGS: Single frontal view of the chest demonstrates right basilar consolidation and effusion. Left chest is relatively clear. No pneumothorax. Cardiac silhouette is grossly unremarkable, though partially obscured by the density at the right lung base. No acute bony abnormalities. Significant bilateral shoulder osteoarthritis. IMPRESSION: 1. Right basilar consolidation and moderate right-sided pleural effusion. Electronically Signed   By: Randa Ngo M.D.   On: 07/21/2019 13:19   US RENAL ARTERY DUPLEX COMPLETE  Result Date: 07/25/2019 CLINICAL DATA:  86 year old with uncontrolled hypertension. EXAM: RENAL/URINARY TRACT ULTRASOUND RENAL DUPLEX DOPPLER ULTRASOUND COMPARISON:  Renal ultrasound 07/22/2019 FINDINGS: Right Kidney: Length: 10.9. Echogenic cortex. No mass or hydronephrosis visualized.  Again noted is an echogenic focus in the lower pole that measures up to 0.4 cm and likely represents a nonobstructive stone. Evidence for a small anechoic cyst in the lower pole measuring up to 1.7 cm. Left Kidney: Length: 11.5. Slightly increased echogenicity in the cortex. Evidence for small left renal cysts. No hydronephrosis. Bladder:  Normal appearance. RENAL DUPLEX ULTRASOUND Right Renal Artery Velocities: Origin:  161 cm/sec Mid:  217 cm/sec Hilum:  140 cm/sec Interlobar:  40 cm/sec Arcuate:  27 cm/sec Left Renal Artery Velocities: Origin:  73 cm/sec Mid:  163 cm/sec Hilum:  55 cm/sec Interlobar:  64 cm/sec Arcuate:  26 cm/sec Aortic Velocity:  137 cm/sec Right Renal-Aortic Ratios: Origin: 1.2 Mid:  1.6 Hilum: 1.0 Interlobar: 0.3 Arcuate: 0.2 Left Renal-Aortic Ratios: Origin: 0.5 Mid: 1.2 Hilum: 0.4 Interlobar: 0.5 Arcuate: 0.2 Bilateral renal veins are patent. Bilateral pleural effusions. Ascites. IMPRESSION: 1. No evidence for hemodynamically significant renal artery stenosis. 2. Ascites and  bilateral pleural effusions. 3. Increased echogenicity in both kidneys and suggestive for chronic medical renal disease. 4. Small bilateral renal cysts. 5. Possible nonobstructive right kidney stone. Electronically Signed   By: Markus Daft M.D.   On: 07/25/2019 15:07   ECHOCARDIOGRAM COMPLETE  Result Date: 07/22/2019    ECHOCARDIOGRAM REPORT   Patient Name:   NEREA BORDENAVE Date of Exam: 07/21/2019 Medical Rec #:  [id number]        Height:       63.0 in Accession #:    [id number]       Weight:       120.0 lb Date of Birth:  05/31/1933        BSA:          1.556 m Patient Age:    39 years         BP:           170/48 mmHg Patient Gender: F                HR:           53 bpm. Exam Location:  [facility] Procedure: 2D Echo, Cardiac Doppler and Color Doppler Indications:     I42.9 Cardiomyopathy  History:         Patient has prior history of Echocardiogram examinations, most                  recent 08/11/2017. Risk Factors:Hypertension, Dyslipidemia and                  Diabetes. DVT.  Sonographer:     Wilford Sports Rodgers-Jones Referring Phys:  [id number] SKAJGOTL AGBATA Diagnosing Phys: Nelva Bush MD  Sonographer Comments: Technically difficult study due to poor echo windows. IMPRESSIONS  1. Left ventricular ejection fraction, by estimation, is 60 to 65%. The left ventricle has normal function. Left ventricular endocardial border not optimally defined to evaluate regional wall motion. Left ventricular diastolic parameters are consistent with Grade I diastolic dysfunction (impaired relaxation). Elevated left atrial pressure.  2. Right ventricular systolic function is normal. The right ventricular size is normal. mildly increased right ventricular wall thickness. Tricuspid regurgitation signal is inadequate for assessing PA pressure.  3. The mitral valve is degenerative. Trivial mitral valve regurgitation. No evidence of mitral stenosis.  4. The aortic valve was not well visualized. Aortic valve regurgitation is trivial. No  aortic stenosis is present. FINDINGS  Left Ventricle: Left ventricular ejection fraction, by estimation, is 60 to 65%. The left ventricle has normal function. Left  ventricular endocardial border not optimally defined to evaluate regional wall motion. The left ventricular internal cavity size was normal in size. There is no left ventricular hypertrophy. Left ventricular diastolic parameters are consistent with Grade I diastolic dysfunction (impaired relaxation). Elevated left atrial pressure. Right Ventricle: The right ventricular size is normal. Mildly increased right ventricular wall thickness. Right ventricular systolic function is normal. Tricuspid regurgitation signal is inadequate for assessing PA pressure. Left Atrium: Left atrial size was not well visualized. Right Atrium: Right atrial size was not well visualized. Pericardium: There is no evidence of pericardial effusion. Mitral Valve: The mitral valve is degenerative in appearance. There is mild thickening of the mitral valve leaflet(s). There is moderate calcification of the mitral valve leaflet(s). Severe mitral annular calcification. Trivial mitral valve regurgitation. No evidence of mitral valve stenosis. Tricuspid Valve: The tricuspid valve is not well visualized. Tricuspid valve regurgitation is trivial. Aortic Valve: The aortic valve was not well visualized. Aortic valve regurgitation is trivial. No aortic stenosis is present. Pulmonic Valve: The pulmonic valve was not well visualized. Pulmonic valve regurgitation is not visualized. No evidence of pulmonic stenosis. Aorta: The aortic root is normal in size and structure. Pulmonary Artery: The pulmonary artery is not well seen. Venous: The inferior vena cava was not well visualized. IAS/Shunts: The interatrial septum was not well visualized.  LEFT VENTRICLE PLAX 2D LVIDd:         4.44 cm  Diastology LVIDs:         2.41 cm  LV e' lateral:   5.66 cm/s LV PW:         0.95 cm  LV E/e' lateral: 24.2 LV  IVS:        0.82 cm  LV e' medial:    6.64 cm/s LVOT diam:     1.60 cm  LV E/e' medial:  20.6 LV SV:         65 LV SV Index:   42 LVOT Area:     2.01 cm  RIGHT VENTRICLE RV S prime:     16.40 cm/s LEFT ATRIUM         Index LA diam:    5.00 cm 3.21 cm/m  AORTIC VALVE LVOT Vmax:   107.00 cm/s LVOT Vmean:  77.000 cm/s LVOT VTI:    0.322 m  AORTA Ao Root diam: 3.00 cm MITRAL VALVE MV Area (PHT): 3.91 cm     SHUNTS MV Decel Time: 194 msec     Systemic VTI:  0.32 m MV E velocity: 137.00 cm/s  Systemic Diam: 1.60 cm MV A velocity: 145.00 cm/s MV E/A ratio:  0.94 Harrell Gave End MD Electronically signed by Nelva Bush MD Signature Date/Time: 07/22/2019/7:20:24 AM    Final     Subjective: Patient continued to feel weak and lethargic.  No new complaints.  Discharge Exam: Vitals:   07/28/19 1108 07/28/19 1423  BP: (!) 165/54 (!) 138/45  Pulse: 67 (!) 58  Resp:  16  Temp:    SpO2:  96%   Vitals:   07/28/19 0713 07/28/19 0753 07/28/19 1108 07/28/19 1423  BP: (!) 168/55 (!) 165/41 (!) 165/54 (!) 138/45  Pulse: 73 81 67 (!) 58  Resp:  17  16  Temp:  98.6 F (37 C)    TempSrc:  Oral    SpO2:  95%  96%  Weight:      Height:        General: Pt is alert, awake, not in acute distress Cardiovascular: RRR, S1/S2 +,  no rubs, no gallops Respiratory: CTA bilaterally, no wheezing, no rhonchi Abdominal: Soft, NT, ND, bowel sounds + Extremities: no edema, no cyanosis.  Right BKA   The results of significant diagnostics from this hospitalization (including imaging, microbiology, ancillary and laboratory) are listed below for reference.    Microbiology: Recent Results (from the past 240 hour(s))  SARS CORONAVIRUS 2 (TAT 6-24 HRS) Nasopharyngeal     Status: None   Collection Time: 07/21/19  2:34 PM   Specimen: Nasopharyngeal  Result Value Ref Range Status   SARS Coronavirus 2 NEGATIVE NEGATIVE Final    Comment: (NOTE) SARS-CoV-2 target nucleic acids are NOT DETECTED. The SARS-CoV-2 RNA is  generally detectable in upper and lower respiratory specimens during the acute phase of infection. Negative results do not preclude SARS-CoV-2 infection, do not rule out co-infections with other pathogens, and should not be used as the sole basis for treatment or other patient management decisions. Negative results must be combined with clinical observations, patient history, and epidemiological information. The expected result is Negative. Fact Sheet for Patients: SugarRoll.be Fact Sheet for Healthcare Providers: https://www.woods-mathews.com/ This test is not yet approved or cleared by the [place] FDA and  has been authorized for detection and/or diagnosis of SARS-CoV-2 by FDA under an Emergency Use Authorization (EUA). This EUA will remain  in effect (meaning this test can be used) for the duration of the COVID-19 declaration under Section 56 4(b)(1) of the Act, 21 U.S.C. section 360bbb-3(b)(1), unless the authorization is terminated or revoked sooner. Performed at [facility], [place] [address], [place], [place] [address]      Labs: BNP (last 3 results) Recent Labs    07/21/19 1221  BNP 101.7*   Basic Metabolic Panel: Recent Labs  Lab 07/24/19 0533 07/25/19 0606 07/26/19 0559 07/27/19 0437 07/28/19 0340  NA 133* 132* 135 131* 134*  K 4.4 4.5 4.6 4.5 4.4  CL 108 106 108 106 107  CO2 18* 19* 21* 22 21*  GLUCOSE 136* 142* 99 129* 104*  BUN 38* 37* 38* 38* 37*  CREATININE 2.15* 2.06* 2.00* 1.96* 1.93*  CALCIUM 7.6* 7.4* 7.6* 7.6* 7.5*  PHOS 5.2* 4.9* 4.3 3.7 3.4   Liver Function Tests: Recent Labs  Lab 07/24/19 0533 07/25/19 0606 07/26/19 0559 07/27/19 0437 07/28/19 0340  ALBUMIN 1.9* 1.9* 1.9* 2.2* 2.1*   No results for input(s): LIPASE, AMYLASE in the last 168 hours. No results for input(s): AMMONIA in the last 168 hours. CBC: Recent Labs  Lab 07/23/19 0450 07/24/19 0533 07/25/19 0606 07/26/19 0559  07/28/19 0340  WBC 8.5 10.1 9.0 9.0 9.3  HGB 7.2* 9.4* 9.2* 8.9* 8.7*  HCT 22.9* 28.7* 28.8* 27.4* 27.2*  MCV 87.1 86.2 87.8 87.0 88.6  PLT 357 379 382 378 385   Cardiac Enzymes: No results for input(s): CKTOTAL, CKMB, CKMBINDEX, TROPONINI in the last 168 hours. BNP: Invalid input(s): POCBNP CBG: Recent Labs  Lab 07/27/19 1151 07/27/19 1557 07/27/19 2112 07/28/19 0756 07/28/19 1217  GLUCAP 119* 148* 120* 101* 139*   D-Dimer No results for input(s): DDIMER in the last 72 hours. Hgb A1c No results for input(s): HGBA1C in the last 72 hours. Lipid Profile No results for input(s): CHOL, HDL, LDLCALC, TRIG, CHOLHDL, LDLDIRECT in the last 72 hours. Thyroid function studies No results for input(s): TSH, T4TOTAL, T3FREE, THYROIDAB in the last 72 hours.  Invalid input(s): FREET3 Anemia work up No results for input(s): VITAMINB12, FOLATE, FERRITIN, TIBC, IRON, RETICCTPCT in the last 72 hours. Urinalysis  Component Value Date/Time   COLORURINE YELLOW (A) 07/21/2019 1434   APPEARANCEUR CLEAR (A) 07/21/2019 1434   LABSPEC 1.013 07/21/2019 1434   PHURINE 6.0 07/21/2019 1434   GLUCOSEU >=500 (A) 07/21/2019 1434   HGBUR NEGATIVE 07/21/2019 1434   BILIRUBINUR NEGATIVE 07/21/2019 1434   BILIRUBINUR negative 07/14/2019 1029   KETONESUR NEGATIVE 07/21/2019 1434   PROTEINUR >=300 (A) 07/21/2019 1434   UROBILINOGEN 0.2 07/14/2019 1029   NITRITE NEGATIVE 07/21/2019 1434   LEUKOCYTESUR NEGATIVE 07/21/2019 1434   Sepsis Labs Invalid input(s): PROCALCITONIN,  WBC,  LACTICIDVEN Microbiology Recent Results (from the past 240 hour(s))  SARS CORONAVIRUS 2 (TAT 6-24 HRS) Nasopharyngeal     Status: None   Collection Time: 07/21/19  2:34 PM   Specimen: Nasopharyngeal  Result Value Ref Range Status   SARS Coronavirus 2 NEGATIVE NEGATIVE Final    Comment: (NOTE) SARS-CoV-2 target nucleic acids are NOT DETECTED. The SARS-CoV-2 RNA is generally detectable in upper and lower respiratory  specimens during the acute phase of infection. Negative results do not preclude SARS-CoV-2 infection, do not rule out co-infections with other pathogens, and should not be used as the sole basis for treatment or other patient management decisions. Negative results must be combined with clinical observations, patient history, and epidemiological information. The expected result is Negative. Fact Sheet for Patients: SugarRoll.be Fact Sheet for Healthcare Providers: https://www.woods-mathews.com/ This test is not yet approved or cleared by the [place] FDA and  has been authorized for detection and/or diagnosis of SARS-CoV-2 by FDA under an Emergency Use Authorization (EUA). This EUA will remain  in effect (meaning this test can be used) for the duration of the COVID-19 declaration under Section 56 4(b)(1) of the Act, 21 U.S.C. section 360bbb-3(b)(1), unless the authorization is terminated or revoked sooner. Performed at [facility], [place] [address] [place], [place] [address]     Time coordinating discharge: Over 30 minutes  SIGNED:  Lorella Nimrod, MD  [facility] 07/28/2019, 2:36 PM  If 7PM-7AM, please contact night-coverage [email or web address]  This record has been created using Systems analyst. Errors have been sought and corrected,but may not always be located. Such creation errors do not reflect on the standard of care.

## 2019-07-29 ENCOUNTER — Telehealth: Payer: Self-pay

## 2019-07-29 NOTE — Telephone Encounter (Signed)
HFU scheduled for 08/04/19 @ 1:20 PM.

## 2019-07-29 NOTE — Telephone Encounter (Signed)
FYI

## 2019-07-29 NOTE — Telephone Encounter (Signed)
I have made the 1st attempt to contact the patient or family member in charge, in order to follow up from recently being discharged from the hospital. I left a message on voicemail requesting a CB. -MM 

## 2019-07-29 NOTE — Telephone Encounter (Signed)
Copied from [place] [id number]. Topic: General - Other >> Jul 29, 2019 12:13 PM Virl Axe D wrote: Reason for TMM:[facility] stated they received a referral for pt from the hospital. They will start home health services next week.

## 2019-07-29 NOTE — Telephone Encounter (Signed)
Okay for visit to be telephone?

## 2019-07-29 NOTE — Telephone Encounter (Signed)
Transition Care Management Follow-up Telephone Call  Date of discharge and from where: [facility] on 07/28/19.  How have you been since you were released from the hospital? Not doing well, pt is still SOB and nausea. Pt is unable to get out of the bed. Pt is staying with her daughter currently due to being bedridden. Pt was able to eat this morning but has not wanted anything since. Pt slept well last night. The swelling in her arms have went down since taking the fluid pill. B/p is running around 150/60 and heart rate is in the 60s. Pt vomited up water last night but was able to keep down potatoes. Declines pain or diarrhea.   Any questions or concerns? Yes, concerned about the constant nausea. Zofran is not helping.  Items Reviewed:  Did the pt receive and understand the discharge instructions provided? Yes   Medications obtained and verified? Yes   Any new allergies since your discharge? No   Dietary orders reviewed? Yes  Do you have support at home? Yes   Other (ie: DME, Home Health, etc) Daughter states HH was ordered but she is unsure what for. Palliative care was ordered as well.   Functional Questionnaire: (I = Independent and D = Dependent)  Bathing/Dressing- D   Meal Prep- D  Eating- D  Maintaining continence- D  Transferring/Ambulation- D, has a wheelchair to get around in once she is able to get out of bed.   Managing Meds- D, daughter manages.   Follow up appointments reviewed:    PCP Hospital f/u appt confirmed? Yes  Scheduled to see Dr Rosanna Randy on 08/04/19 @ 3:40 AM. Daughter would like to complete a telephonic visit due to pt being bedridden.   [facility] f/u appt confirmed? Yes    Are transportation arrangements needed? No   If their condition worsens, is the pt aware to call  their PCP or go to the ED? Yes  Was the patient provided with contact information for the PCP's office or ED? Yes  Was the pt encouraged to call back with questions or  concerns? Yes

## 2019-07-30 ENCOUNTER — Telehealth: Payer: Self-pay | Admitting: Adult Health Nurse Practitioner

## 2019-07-30 NOTE — Telephone Encounter (Signed)
Patient's daughter advised.  

## 2019-07-30 NOTE — Telephone Encounter (Signed)
She needs to be seen in the office as she will need exam and blood work.

## 2019-07-31 ENCOUNTER — Telehealth: Payer: Self-pay | Admitting: Adult Health Nurse Practitioner

## 2019-07-31 ENCOUNTER — Encounter: Payer: Self-pay | Admitting: Family Medicine

## 2019-07-31 ENCOUNTER — Encounter: Payer: Self-pay | Admitting: Gastroenterology

## 2019-07-31 ENCOUNTER — Ambulatory Visit: Payer: Self-pay

## 2019-07-31 ENCOUNTER — Telehealth: Payer: Self-pay

## 2019-07-31 ENCOUNTER — Encounter: Payer: Self-pay | Admitting: Adult Health Nurse Practitioner

## 2019-07-31 DIAGNOSIS — R531 Weakness: Secondary | ICD-10-CM

## 2019-07-31 NOTE — Telephone Encounter (Signed)
Spoke with patient's daughter Janace Hoard regarding Palliative services and all questions were answered and she was in agreement with starting services for her Mom.  She has requested a visit either tomorrow or Monday, so I told her that I would reach out to the NP to see if she could work patient in tomorrow and that I would get back with her in the AM and she was in agreement with this.

## 2019-07-31 NOTE — Telephone Encounter (Signed)
Called Angie (daughter) back to let her know that NP could see patient on Monday and she was in agreement with this.  I have scheduled an In-Person Palliative Consult for 08/03/19 @ 3:30 PM

## 2019-07-31 NOTE — Telephone Encounter (Signed)
Please advised if we can do a virtual appointment

## 2019-07-31 NOTE — Telephone Encounter (Signed)
Copied from [place] [id number]. Topic: General - Other >> Jul 30, 2019  4:57 PM Yvette Rack wrote: Reason for CRM: Pt would like to request approval for a Home Health nurse. Pt stated she would prefer to use [facility]

## 2019-07-31 NOTE — Telephone Encounter (Signed)
Incoming call from Patient's Daughter. With a complaint of evated blood pressure of Patient.  Daughter states that Patient  Feels clammy. Blood sugar is 54.  Daughter reports  Had not eaten much today.  Reports Patient is alert. Blood Pressure values are 163/64 and 171/64 right arm is swelling. Patient recently discharged from hospital.   HR is 64.  Reviewed protocol with family.  Recommended Patient go to ED for evaluation. Voiced understanding.        reccoc      Reason for Disposition . Systolic BP  >= 003 OR Diastolic >= 704 . [8] Systolic BP  >= 889 OR Diastolic >= 169 AND [4] cardiac or neurologic symptoms (e.g., chest pain, difficulty breathing, unsteady gait, blurred vision)  Answer Assessment - Initial Assessment Questions 1. BLOOD PRESSURE: "What is the blood pressure?" "Did you take at least two measurements 5 minutes apart?"     163/64 ,   171/64 2. ONSET: "When did you take your blood pressure?"     *No Answer* 3. HOW: "How did you obtain the blood pressure?" (e.g., visiting nurse, automatic home BP monitor)    automatic 4. HISTORY: "Do you have a history of high blood pressure?"      5. MEDICATIONS: "Are you taking any medications for blood pressure?" "Have you missed any doses recently?"     Denies  6. OTHER SYMPTOMS: "Do you have any symptoms?" (e.g., headache, chest pain, blurred vision, difficulty breathing, weakness)     *No Answer* 7. PREGNANCY: "Is there any chance you are pregnant?" "When was your last menstrual period?"    na  Protocols used: HIGH BLOOD PRESSURE-A-AH

## 2019-08-02 ENCOUNTER — Encounter: Payer: Self-pay | Admitting: Adult Health Nurse Practitioner

## 2019-08-02 ENCOUNTER — Encounter: Payer: Self-pay | Admitting: Gastroenterology

## 2019-08-03 ENCOUNTER — Other Ambulatory Visit: Payer: Self-pay | Admitting: Family Medicine

## 2019-08-03 ENCOUNTER — Other Ambulatory Visit: Payer: Self-pay

## 2019-08-03 ENCOUNTER — Other Ambulatory Visit: Payer: Medicare Other | Admitting: Adult Health Nurse Practitioner

## 2019-08-03 DIAGNOSIS — Z515 Encounter for palliative care: Secondary | ICD-10-CM | POA: Diagnosis not present

## 2019-08-03 DIAGNOSIS — I509 Heart failure, unspecified: Secondary | ICD-10-CM | POA: Diagnosis not present

## 2019-08-03 NOTE — Telephone Encounter (Signed)
Please advise 

## 2019-08-03 NOTE — Progress Notes (Signed)
[facility] Consult Note Telephone: [phone]  Fax: [phone]  PATIENT NAME: Melanie Diaz DOB: 11/20/1933 MRN: [id number]  PRIMARY CARE PROVIDER:   [name]., MD  REFERRING PROVIDER:  [name]., MD [address],  [place] [address]  RESPONSIBLE PARTY:   Loistine Chance, daughter [phone]      RECOMMENDATIONS and PLAN:  1.  Advanced care planning.  Patient is a full code.  Patient does not want to discuss ACP today.  Left MOST form with daughter and encouraged patient to talk about her medical wishes with her family.  2.  Weakness.  Patient was in hospital 3/9-3/16/21 for upper extremity swelling.  Was found to be anemic with acute kidney injury.  Patient prior to hospitalization was wheelchair bound but able to live alone and transfer herself.  Daughter states that over a few weeks she started having weakness and not eating.  Now she is bedbound and requires extensive assistance with transfers and ADLs.  Daughter is primary caregiver and is unable to transfer her or pull her up in bed by herself.  She has to wait for family to help. A hoyer lift would help daughter with transfers when by herself.  Patient is able to help minimally with repositioning in bed. [place] is to start PT in the home and this is still waiting to be set up.  Will have RN clinical navigator reach out to [facility] to get start date of PT and will reach out to PCP for order for lift. Patient is starting to get bogginess over left heel.  Encouraged frequent repositioning and keeping skin clean and dry to prevent pressure injury.  Also suggested using lotion on her skin to prevent dryness that leads to cracked skin.    3.  Appetite.  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  She also has problems with constipation.  She had a good BM last night after taking laxative.  Daughter  states that last BM prior to this was Tuesday of last week.  She takes a stool softener daily.  Have encouraged to give her the laxative daily.  If starts causing diarrhea can cut back to every other day or PRN.  Patient states that she feels full all the time and that is why she does not have an appetite.  Believe she may have gastroparesis related to her diabetes.  Hesitant to use metoclopramide due to her history of blood clots.  She was started on remeron 2 weeks ago.  Believe her full feeling could also be related to her constipation.  Will monitor for effectiveness of daily laxative.  She is on glipizide 10 mg daily for her DMT2 and her blood sugars have been running in 120s- 130s before and after meals.  Daughter has been holding her glipizide and encouraged to continue holding as long as her blood sugars stay low.  Daughter states that she has been feeling like her pills and food get stuck in her throat and has been coughing with liquids.  Will reach out to PCP for order for ST to evaluate.  She has appointment with GI for endoscopy.  Encouraged to keep this but may require special arrangements to get her to the appointment.   Patient has multiple comorbidities including CHF, DMT2, CKD and has had increasing weakness and loss of appetite.  Denies increased SOB, cough, fever,  chest pain, palpitations, headaches.  Labs from hospital lab show low protein 6.0 and albumin of 2.1.  She is anemic with H&H of 8.7/27.2, iron 14, and TIBC 185.  Discussed with patient hospice eligibility with her rapid decline and not eating much.  She wants to try to work with PT and to increase her appetite.  Will continue to monitor for symptom management/decline and make recommendations as needed.  Next appointment is in 2 weeks.  I spent 90 minutes providing this consultation,  from 3:30 to 5:00 including time with patient/family, chart review, provider coordination, and documentation. More than 50% of the time in this  consultation was spent coordinating communication.   HISTORY OF PRESENT ILLNESS:  Melanie Diaz is a 86 y.o. year old female with multiple medical problems including PCM, CKD, anemia, DMT2, HTN, PAD, h/o DVT, depression. Palliative Care was asked to help address goals of care.   CODE STATUS: full code  PPS: 30% HOSPICE ELIGIBILITY/DIAGNOSIS: TBD  PHYSICAL EXAM:  BP 178/62  HR 60  O2 92% on RA General: NAD, frail appearing, thin Cardiovascular: regular rate and rhythm Pulmonary: lung sounds clear; normal respiratory effort Abdomen:  + bowel sounds; some firmness noted due to constipation GU: no suprapubic tenderness Extremities: Hs non pitting edema noted to bilateral upper extremities; right BKA Skin: some bogginess to left heel with no erythema noted Neurological: Weakness but otherwise nonfocal  PAST MEDICAL HISTORY:  Past Medical History:  Diagnosis Date  . Depression   . Diabetes mellitus without complication ([place])   . History of deep vein thrombosis    about 30 years ago  . Hyperlipidemia 07/06/2005  . Hypertension     SOCIAL HX:  Social History   Tobacco Use  . Smoking status: Never Smoker  . Smokeless tobacco: Never Used  . Tobacco comment: smoking cessation materials not required  Substance Use Topics  . Alcohol use: No    ALLERGIES:  Allergies  Allergen Reactions  . Amoxicillin Other (See Comments)    Has patient had a PCN reaction causing immediate rash, facial/tongue/throat swelling, SOB or lightheadedness with hypotension: Unknown Has patient had a PCN reaction causing severe rash involving mucus membranes or skin necrosis: Unknown Has patient had a PCN reaction that required hospitalization: Unknown Has patient had a PCN reaction occurring within the last 10 years: Unknown If all of the above answers are "NO", then may proceed with Cephalosporin use.   . Azithromycin     vomiting and stomach upset     PERTINENT MEDICATIONS:  Outpatient Encounter  Medications as of 08/03/2019  Medication Sig  . amLODipine (NORVASC) 10 MG tablet Take 1 tablet (10 mg total) by mouth daily.  Marland Kitchen apixaban (ELIQUIS) 2.5 MG TABS tablet Take 1 tablet (2.5 mg total) by mouth 2 (two) times daily.  Marland Kitchen atorvastatin (LIPITOR) 40 MG tablet Take 1 tablet (40 mg total) by mouth daily at 6 PM.  . bisacodyl (DULCOLAX) 10 MG suppository Place 1 suppository (10 mg total) rectally daily as needed for moderate constipation.  . furosemide (LASIX) 20 MG tablet Take 1 tablet (20 mg total) by mouth daily.  Marland Kitchen glipiZIDE (GLUCOTROL) 10 MG tablet Take 1 tablet (10 mg total) by mouth daily.  . hydrALAZINE (APRESOLINE) 25 MG tablet Take 3 tablets (75 mg total) by mouth every 8 (eight) hours.  Marland Kitchen levothyroxine (SYNTHROID) 50 MCG tablet Take 1 tablet (50 mcg total) by mouth daily at 6 (six) AM.  . losartan (COZAAR) 100 MG tablet Take 1  tablet (100 mg total) by mouth daily.  . mirtazapine (REMERON) 30 MG tablet Take 1 tablet (30 mg total) by mouth at bedtime.  . Multiple Vitamin (MULTIVITAMIN WITH MINERALS) TABS tablet Take 1 tablet by mouth daily.  . ondansetron (ZOFRAN) 4 MG tablet Take 1 tablet (4 mg total) by mouth every 6 (six) hours as needed for nausea.  . pantoprazole (PROTONIX) 40 MG tablet Take 1 tablet (40 mg total) by mouth 2 (two) times daily.  . sodium bicarbonate 650 MG tablet Take 1 tablet (650 mg total) by mouth 3 (three) times daily.   No facility-administered encounter medications on file as of 08/03/2019.      Oshua Mcconaha Jenetta Downer, NP

## 2019-08-03 NOTE — Telephone Encounter (Signed)
Can you please send link for Wednesday appointment with [facility]

## 2019-08-03 NOTE — Telephone Encounter (Signed)
Refill request for METOPROLOL TARTRATE 50 MG TAB. Advised to stop taking this med on 07/28/19 at discharge from  Hospital.

## 2019-08-04 ENCOUNTER — Inpatient Hospital Stay: Payer: Medicare Other | Admitting: Family Medicine

## 2019-08-04 ENCOUNTER — Other Ambulatory Visit: Payer: Self-pay | Admitting: Family Medicine

## 2019-08-04 ENCOUNTER — Ambulatory Visit: Payer: Self-pay | Admitting: Family Medicine

## 2019-08-04 ENCOUNTER — Encounter: Payer: Self-pay | Admitting: Family Medicine

## 2019-08-04 DIAGNOSIS — E1122 Type 2 diabetes mellitus with diabetic chronic kidney disease: Secondary | ICD-10-CM | POA: Diagnosis not present

## 2019-08-04 DIAGNOSIS — Z86718 Personal history of other venous thrombosis and embolism: Secondary | ICD-10-CM | POA: Diagnosis not present

## 2019-08-04 DIAGNOSIS — Z7901 Long term (current) use of anticoagulants: Secondary | ICD-10-CM | POA: Diagnosis not present

## 2019-08-04 DIAGNOSIS — I10 Essential (primary) hypertension: Secondary | ICD-10-CM

## 2019-08-04 DIAGNOSIS — Z4781 Encounter for orthopedic aftercare following surgical amputation: Secondary | ICD-10-CM | POA: Diagnosis not present

## 2019-08-04 DIAGNOSIS — E1151 Type 2 diabetes mellitus with diabetic peripheral angiopathy without gangrene: Secondary | ICD-10-CM | POA: Diagnosis not present

## 2019-08-04 DIAGNOSIS — E785 Hyperlipidemia, unspecified: Secondary | ICD-10-CM | POA: Diagnosis not present

## 2019-08-04 DIAGNOSIS — D631 Anemia in chronic kidney disease: Secondary | ICD-10-CM | POA: Diagnosis not present

## 2019-08-04 DIAGNOSIS — Z7984 Long term (current) use of oral hypoglycemic drugs: Secondary | ICD-10-CM | POA: Diagnosis not present

## 2019-08-04 DIAGNOSIS — M6281 Muscle weakness (generalized): Secondary | ICD-10-CM | POA: Diagnosis not present

## 2019-08-04 DIAGNOSIS — Z89511 Acquired absence of right leg below knee: Secondary | ICD-10-CM | POA: Diagnosis not present

## 2019-08-04 NOTE — Addendum Note (Signed)
Addended by: Julieta Bellini on: 08/04/2019 02:46 PM   Modules accepted: Orders

## 2019-08-04 NOTE — Telephone Encounter (Signed)
ok 

## 2019-08-04 NOTE — Addendum Note (Signed)
Addended by: Julieta Bellini on: 08/04/2019 02:25 PM   Modules accepted: Orders

## 2019-08-04 NOTE — Telephone Encounter (Signed)
Requested medication (s) are due for refill today: yes  Requested medication (s) are on the active medication list: no  Last refill:   Future visit scheduled: yes  Notes to clinic:  not on active med list: was to stop taking at discharge    Requested Prescriptions  Pending Prescriptions Disp Refills   metoprolol tartrate (LOPRESSOR) 50 MG tablet [Pharmacy Med Name: METOPROLOL TARTRATE 50 MG TAB] 60 tablet 0    Sig: Take 1 tablet (50 mg total) by mouth 2 (two) times daily.      Cardiovascular:  Beta Blockers Failed - 08/04/2019 10:14 AM      Failed - Last BP in normal range    BP Readings from Last 1 Encounters:  07/28/19 (!) 153/43          Passed - Last Heart Rate in normal range    Pulse Readings from Last 1 Encounters:  07/28/19 69          Passed - Valid encounter within last 6 months    Recent Outpatient Visits           2 weeks ago Type 2 diabetes mellitus with other specified complication, without long-term current use of insulin [facility])   [facility] [name]., MD   5 months ago Essential hypertension   [facility] [name]., MD   6 months ago Essential hypertension   [facility] [name]., MD   7 months ago Type 2 diabetes mellitus with other specified complication, without long-term current use of insulin [facility])   [facility] [name]., MD   7 months ago Cellulitis, unspecified cellulitis site   [facility] [name]., MD       Future Appointments             Today [name]., MD [facility]   Tomorrow Vanga, Tally Due, MD [facility]   In 3 weeks [name]., MD [facility], [facility]

## 2019-08-04 NOTE — Progress Notes (Deleted)
Patient: Melanie Diaz Female    DOB: 28-Mar-1934   86 y.o.   MRN: [id number] Visit Date: 08/04/2019  Today's Provider: Wilhemena Durie, MD   No chief complaint on file.  Subjective:     HPI  Allergies  Allergen Reactions  . Amoxicillin Other (See Comments)    Has patient had a PCN reaction causing immediate rash, facial/tongue/throat swelling, SOB or lightheadedness with hypotension: Unknown Has patient had a PCN reaction causing severe rash involving mucus membranes or skin necrosis: Unknown Has patient had a PCN reaction that required hospitalization: Unknown Has patient had a PCN reaction occurring within the last 10 years: Unknown If all of the above answers are "NO", then may proceed with Cephalosporin use.   . Azithromycin     vomiting and stomach upset     Current Outpatient Medications:  .  amLODipine (NORVASC) 10 MG tablet, Take 1 tablet (10 mg total) by mouth daily., Disp: 90 tablet, Rfl: 0 .  apixaban (ELIQUIS) 2.5 MG TABS tablet, Take 1 tablet (2.5 mg total) by mouth 2 (two) times daily., Disp: 60 tablet, Rfl: 1 .  atorvastatin (LIPITOR) 40 MG tablet, Take 1 tablet (40 mg total) by mouth daily at 6 PM., Disp: 30 tablet, Rfl: 5 .  bisacodyl (DULCOLAX) 10 MG suppository, Place 1 suppository (10 mg total) rectally daily as needed for moderate constipation., Disp: 12 suppository, Rfl: 0 .  furosemide (LASIX) 20 MG tablet, Take 1 tablet (20 mg total) by mouth daily., Disp: 30 tablet, Rfl: 11 .  glipiZIDE (GLUCOTROL) 10 MG tablet, Take 1 tablet (10 mg total) by mouth daily., Disp: 30 tablet, Rfl: 5 .  hydrALAZINE (APRESOLINE) 25 MG tablet, Take 3 tablets (75 mg total) by mouth every 8 (eight) hours., Disp: 90 tablet, Rfl: 0 .  levothyroxine (SYNTHROID) 50 MCG tablet, Take 1 tablet (50 mcg total) by mouth daily at 6 (six) AM., Disp: 30 tablet, Rfl: 0 .  losartan (COZAAR) 100 MG tablet, Take 1 tablet (100 mg total) by mouth daily., Disp: 30 tablet, Rfl: 0 .   mirtazapine (REMERON) 30 MG tablet, Take 1 tablet (30 mg total) by mouth at bedtime., Disp: 30 tablet, Rfl: 5 .  Multiple Vitamin (MULTIVITAMIN WITH MINERALS) TABS tablet, Take 1 tablet by mouth daily., Disp: 90 tablet, Rfl: 0 .  ondansetron (ZOFRAN) 4 MG tablet, Take 1 tablet (4 mg total) by mouth every 6 (six) hours as needed for nausea., Disp: 20 tablet, Rfl: 0 .  pantoprazole (PROTONIX) 40 MG tablet, Take 1 tablet (40 mg total) by mouth 2 (two) times daily., Disp: 60 tablet, Rfl: 0 .  sodium bicarbonate 650 MG tablet, Take 1 tablet (650 mg total) by mouth 3 (three) times daily., Disp: 90 tablet, Rfl: 0  Review of Systems  Constitutional: Negative for appetite change, chills, fatigue and fever.  Respiratory: Negative for chest tightness and shortness of breath.   Cardiovascular: Negative for chest pain and palpitations.  Gastrointestinal: Negative for abdominal pain, nausea and vomiting.  Neurological: Negative for dizziness and weakness.    Social History   Tobacco Use  . Smoking status: Never Smoker  . Smokeless tobacco: Never Used  . Tobacco comment: smoking cessation materials not required  Substance Use Topics  . Alcohol use: No      Objective:   There were no vitals taken for this visit. There were no vitals filed for this visit.There is no height or weight on file to calculate BMI.  Physical Exam   No results found for any visits on 08/04/19.     Assessment & Plan        Wilhemena Durie, MD  [facility]

## 2019-08-04 NOTE — Telephone Encounter (Signed)
Order for [place] Nurse/Aide in epic.

## 2019-08-05 ENCOUNTER — Ambulatory Visit (INDEPENDENT_AMBULATORY_CARE_PROVIDER_SITE_OTHER): Payer: Medicare Other | Admitting: Gastroenterology

## 2019-08-05 ENCOUNTER — Other Ambulatory Visit: Payer: Self-pay

## 2019-08-05 ENCOUNTER — Telehealth: Payer: Self-pay | Admitting: Family Medicine

## 2019-08-05 ENCOUNTER — Encounter: Payer: Self-pay | Admitting: Gastroenterology

## 2019-08-05 DIAGNOSIS — R1310 Dysphagia, unspecified: Secondary | ICD-10-CM | POA: Diagnosis not present

## 2019-08-05 DIAGNOSIS — R630 Anorexia: Secondary | ICD-10-CM | POA: Diagnosis not present

## 2019-08-05 DIAGNOSIS — R1013 Epigastric pain: Secondary | ICD-10-CM | POA: Diagnosis not present

## 2019-08-05 MED ORDER — OLANZAPINE 2.5 MG PO TABS: 2.5000 mg | ORAL_TABLET | Freq: Every day | ORAL | 0 refills | Status: AC

## 2019-08-05 NOTE — Telephone Encounter (Signed)
ok 

## 2019-08-05 NOTE — Telephone Encounter (Signed)
Home Health Verbal Orders - Caller/Agency: [facility] Callback Number: [phone] secure Requesting OT/PT/Skilled Nursing/Social Work/Speech Therapy: PT Frequency: 2x for 1wk, 3x for 1wk, 2x for 5 weks, 1x for 1wk  Nursing and Speech Evaluations hoyer list script.

## 2019-08-05 NOTE — Progress Notes (Signed)
Melanie Sear, MD [address]  [place]  [place], [place] [address]  Main: [phone]  Fax: [phone]    Gastroenterology Consultation Tele Visit  Referring Provider:     [name].,* Primary Care Physician:  [name]., MD Primary Gastroenterologist:  Dr. Cephas Darby Reason for Consultation: Nausea, early satiety, lack of appetite        HPI:   Melanie Diaz is a 86 y.o. female referred by Dr. Rosanna Randy, Retia Passe., MD  for consultation & management of nausea, early satiety, lack of appetite  Virtual Visit via Telephone Note  I connected with Melanie Diaz on 08/05/19 at  1:15 PM EDT by telephone and verified that I am speaking with the correct person using two identifiers.   I discussed the limitations, risks, security and privacy concerns of performing an evaluation and management service by telephone and the availability of in person appointments. I also discussed with the patient that there may be a patient responsible charge related to this service. The patient expressed understanding and agreed to proceed.  Location of the Patient: Home  Location of the provider: Office  Persons participating in the visit: Patient, patient's daughter and provider  History of Present Illness: I originally saw Melanie Diaz at [facility] which was admitted on 07/21/2019 secondary to swelling of her right upper extremity, poor p.o. intake and generalized weakness.  She is found to have worsening kidney function, bradycardia heart rate between 42 and 44.  Patient was evaluated by cardiology, beta blockers were discontinued with improvement in heart rate.  She is also started on Synthroid due to elevated TSH and lethargy.  GI is consulted due to anorexia, nausea without any vomiting.  She was also complaining of fullness in stomach and burping. Patient reports that she had a tick bite and was treated with antibiotics last summer.  She had stomach upset with  antibiotics which has improved.  Subsequently, she developed right upper arm swelling, was told she had cellulitis.  Patient reports that since then her appetite has been gradually declining.  Patient's daughter is bedside who reports that she has been depressed lately since the death of her best body.  Patient reports feeling nauseous and full after a few bites only.  She denies abdominal pain, constipation, vomiting.  Coumadin was discontinued since admission and switched to Eliquis on 3/15  At that time, upper endoscopy was recommended.  However patient and her daughter felt the procedure need risky and therefore deferred.  She is discharged home on Protonix 40mg  twice daily, Remeron at bedtime as well as Zofran as needed for nausea.  Since discharge, patient is living with her daughter.  Regarding to her, nausea is improved, occasionally requires Zofran.  She is taking Protonix 40 mg 2 times a day.  Her main issue is difficulty swallowing pills, food.  She does not have desire to eat more than 5-6 bites of food.  She feels full in her stomach.  She denies good appetite.  According to her daughter, her weight is overall stable.  Her daughter also expressed concern regarding constipation   NSAIDs: None  Antiplts/Anticoagulants/Anti thrombotics:  Previously on Coumadin for history of DVT, currently on Eliquis  GI Procedures: None  Past Medical History:  Diagnosis Date  . Depression   . Diabetes mellitus without complication ([place])   . History of deep vein thrombosis    about 30 years ago  . Hyperlipidemia 07/06/2005  .  Hypertension     Past Surgical History:  Procedure Laterality Date  . LEG AMPUTATION Right     Current Outpatient Medications:  .  amLODipine (NORVASC) 10 MG tablet, Take 1 tablet (10 mg total) by mouth daily., Disp: 90 tablet, Rfl: 0 .  apixaban (ELIQUIS) 2.5 MG TABS tablet, Take 1 tablet (2.5 mg total) by mouth 2 (two) times daily., Disp: 60 tablet, Rfl: 1 .   atorvastatin (LIPITOR) 40 MG tablet, Take 1 tablet (40 mg total) by mouth daily at 6 PM., Disp: 30 tablet, Rfl: 5 .  bisacodyl (DULCOLAX) 10 MG suppository, Place 1 suppository (10 mg total) rectally daily as needed for moderate constipation., Disp: 12 suppository, Rfl: 0 .  furosemide (LASIX) 20 MG tablet, Take 1 tablet (20 mg total) by mouth daily., Disp: 30 tablet, Rfl: 11 .  glipiZIDE (GLUCOTROL) 10 MG tablet, Take 1 tablet (10 mg total) by mouth daily., Disp: 30 tablet, Rfl: 5 .  hydrALAZINE (APRESOLINE) 25 MG tablet, Take 3 tablets (75 mg total) by mouth every 8 (eight) hours., Disp: 90 tablet, Rfl: 0 .  levothyroxine (SYNTHROID) 50 MCG tablet, Take 1 tablet (50 mcg total) by mouth daily at 6 (six) AM., Disp: 30 tablet, Rfl: 0 .  losartan (COZAAR) 100 MG tablet, Take 1 tablet (100 mg total) by mouth daily., Disp: 30 tablet, Rfl: 0 .  mirtazapine (REMERON) 30 MG tablet, Take 1 tablet (30 mg total) by mouth at bedtime., Disp: 30 tablet, Rfl: 5 .  ondansetron (ZOFRAN) 4 MG tablet, Take 1 tablet (4 mg total) by mouth every 6 (six) hours as needed for nausea., Disp: 20 tablet, Rfl: 0 .  pantoprazole (PROTONIX) 40 MG tablet, Take 1 tablet (40 mg total) by mouth 2 (two) times daily., Disp: 60 tablet, Rfl: 0 .  OLANZapine (ZYPREXA) 2.5 MG tablet, Take 1 tablet (2.5 mg total) by mouth at bedtime., Disp: 30 tablet, Rfl: 0 .  sodium bicarbonate 650 MG tablet, Take 1 tablet (650 mg total) by mouth 3 (three) times daily. (Patient not taking: Reported on 08/05/2019), Disp: 90 tablet, Rfl: 0   Family History  Problem Relation Age of Onset  . Kidney failure Mother   . Heart attack Sister        Hx of MI  . Diabetes Brother        borderline diabetes  . Cancer Maternal Aunt        Ovarian cancer  . Breast cancer Daughter      Social History   Tobacco Use  . Smoking status: Never Smoker  . Smokeless tobacco: Never Used  . Tobacco comment: smoking cessation materials not required  Substance Use  Topics  . Alcohol use: No  . Drug use: No    Allergies as of 08/05/2019 - Review Complete 08/05/2019  Allergen Reaction Noted  . Amoxicillin Other (See Comments) 12/20/2014  . Azithromycin  11/16/2014    Imaging Studies: No abdominal imaging  Assessment and Plan:   Melanie Diaz is a 86 y.o. female with history of DVT on Eliquis, diabetes, hypertension, hyperlipidemia, depression, chronic kidney disease is connected on a telephone visit as a hospital follow-up of nausea, difficulty swallowing as well early satiety, loss of appetite.  Today, I reiterated about upper endoscopy however, patient is reluctant to undergo at this time.  I also offered x-ray esophagus with upper GI series and her daughter is concerned that she may not drink the barium contrast.  Advised to switch from Remeron to low-dose Zyprexa  2.5mg  at bedtime and her daughter is agreeable.  Suggested to start MiraLAX once a day and fiber supplements.  If her symptoms are worsening, recommend CT abdomen and pelvis without contrast.  I have advised the daughter to call my office with any questions or concerns.  She expressed understanding of the plan   Follow Up Instructions:   I discussed the assessment and treatment plan with the patient. The patient was provided an opportunity to ask questions and all were answered. The patient agreed with the plan and demonstrated an understanding of the instructions.   The patient was advised to call back or seek an in-person evaluation if the symptoms worsen or if the condition fails to improve as anticipated.  I provided 20 minutes of non-face-to-face time during this encounter.   Follow up in 1-33months   Cephas Darby, MD

## 2019-08-06 ENCOUNTER — Encounter: Payer: Self-pay | Admitting: Family Medicine

## 2019-08-06 ENCOUNTER — Telehealth: Payer: Self-pay | Admitting: *Deleted

## 2019-08-06 ENCOUNTER — Telehealth: Payer: Self-pay | Admitting: Adult Health Nurse Practitioner

## 2019-08-06 DIAGNOSIS — Z515 Encounter for palliative care: Secondary | ICD-10-CM

## 2019-08-06 DIAGNOSIS — I509 Heart failure, unspecified: Secondary | ICD-10-CM | POA: Diagnosis not present

## 2019-08-06 NOTE — Telephone Encounter (Signed)
Daughter called about increased edema that he mother is having.  States that she is having increased swelling of left breast and having increased SOB.  States that she has left a message with Dr. Alben Spittle office.  Have instructed her to go ahead and give her one extra dose of her lasix now and then follow further instructions when Dr. Rosanna Randy calls back.  Discussed disease progression and recommendation for hospice.  She wants to see how she will do with therapy and increasing her oral intake.  She did say that her mother has been eating a little bit more.  Encouraged to call with any concerns Waino Mounsey K. Olena Heckle NP

## 2019-08-06 NOTE — Telephone Encounter (Signed)
Daria from [facility] called stating that for [facility] lift order that was sent in yesterday for pt. There needs to be a note and dx of generalized weakness; in patient's most recent office visit note. Which would be 07/15/2019. Note must say "Patient requires maximum assistance for all transfers, due to generalized weakness".

## 2019-08-07 ENCOUNTER — Telehealth: Payer: Self-pay

## 2019-08-07 DIAGNOSIS — I10 Essential (primary) hypertension: Secondary | ICD-10-CM | POA: Diagnosis not present

## 2019-08-07 DIAGNOSIS — E785 Hyperlipidemia, unspecified: Secondary | ICD-10-CM | POA: Diagnosis not present

## 2019-08-07 DIAGNOSIS — Z86718 Personal history of other venous thrombosis and embolism: Secondary | ICD-10-CM | POA: Diagnosis not present

## 2019-08-07 DIAGNOSIS — E1122 Type 2 diabetes mellitus with diabetic chronic kidney disease: Secondary | ICD-10-CM | POA: Diagnosis not present

## 2019-08-07 DIAGNOSIS — E1151 Type 2 diabetes mellitus with diabetic peripheral angiopathy without gangrene: Secondary | ICD-10-CM | POA: Diagnosis not present

## 2019-08-07 DIAGNOSIS — Z89511 Acquired absence of right leg below knee: Secondary | ICD-10-CM | POA: Diagnosis not present

## 2019-08-07 DIAGNOSIS — D631 Anemia in chronic kidney disease: Secondary | ICD-10-CM | POA: Diagnosis not present

## 2019-08-07 DIAGNOSIS — M6281 Muscle weakness (generalized): Secondary | ICD-10-CM | POA: Diagnosis not present

## 2019-08-07 DIAGNOSIS — Z7901 Long term (current) use of anticoagulants: Secondary | ICD-10-CM | POA: Diagnosis not present

## 2019-08-07 DIAGNOSIS — Z7984 Long term (current) use of oral hypoglycemic drugs: Secondary | ICD-10-CM | POA: Diagnosis not present

## 2019-08-07 DIAGNOSIS — Z4781 Encounter for orthopedic aftercare following surgical amputation: Secondary | ICD-10-CM | POA: Diagnosis not present

## 2019-08-07 LAB — BASIC METABOLIC PANEL
BUN: 33 — AB (ref 4–21)
CO2: 19 (ref 13–22)
Chloride: 106 (ref 99–108)
Creatinine: 1.9 — AB (ref 0.5–1.1)
Glucose: 148
Potassium: 5 (ref 3.4–5.3)
Sodium: 137 (ref 137–147)

## 2019-08-07 LAB — COMPREHENSIVE METABOLIC PANEL
Calcium: 7.9 — AB (ref 8.7–10.7)
GFR calc Af Amer: 27
GFR calc non Af Amer: 24

## 2019-08-07 NOTE — Telephone Encounter (Signed)
[facility] with [facility] is at the pt home and would like verbal order to draw patient labs today

## 2019-08-07 NOTE — Telephone Encounter (Signed)
Copied from [place] [phone]. Topic: General - Other >> Aug 07, 2019  8:45 AM Rainey Pines A wrote: Patients daughter would like a callback from nurse in regards to if lab orders have been to sent to home health nurse for patients appointment today. Please advise

## 2019-08-07 NOTE — Telephone Encounter (Signed)
Melanie Diaz verbal okay to do blood draw. Labs advised in recent ER discharge note advised repeat BMP and CBC.

## 2019-08-08 LAB — BASIC METABOLIC PANEL: BUN/Creatinine Ratio: 17

## 2019-08-10 DIAGNOSIS — D631 Anemia in chronic kidney disease: Secondary | ICD-10-CM | POA: Diagnosis not present

## 2019-08-10 DIAGNOSIS — Z89511 Acquired absence of right leg below knee: Secondary | ICD-10-CM | POA: Diagnosis not present

## 2019-08-10 DIAGNOSIS — Z86718 Personal history of other venous thrombosis and embolism: Secondary | ICD-10-CM | POA: Diagnosis not present

## 2019-08-10 DIAGNOSIS — I10 Essential (primary) hypertension: Secondary | ICD-10-CM | POA: Diagnosis not present

## 2019-08-10 DIAGNOSIS — Z7901 Long term (current) use of anticoagulants: Secondary | ICD-10-CM | POA: Diagnosis not present

## 2019-08-10 DIAGNOSIS — E785 Hyperlipidemia, unspecified: Secondary | ICD-10-CM | POA: Diagnosis not present

## 2019-08-10 DIAGNOSIS — Z7984 Long term (current) use of oral hypoglycemic drugs: Secondary | ICD-10-CM | POA: Diagnosis not present

## 2019-08-10 DIAGNOSIS — E1151 Type 2 diabetes mellitus with diabetic peripheral angiopathy without gangrene: Secondary | ICD-10-CM | POA: Diagnosis not present

## 2019-08-10 DIAGNOSIS — Z4781 Encounter for orthopedic aftercare following surgical amputation: Secondary | ICD-10-CM | POA: Diagnosis not present

## 2019-08-10 DIAGNOSIS — E1122 Type 2 diabetes mellitus with diabetic chronic kidney disease: Secondary | ICD-10-CM | POA: Diagnosis not present

## 2019-08-10 DIAGNOSIS — M6281 Muscle weakness (generalized): Secondary | ICD-10-CM | POA: Diagnosis not present

## 2019-08-10 NOTE — Telephone Encounter (Signed)
Patient's daughter Janace Hoard was advised. Angie states she will speak to Home Health concerning swelling.

## 2019-08-11 ENCOUNTER — Telehealth: Payer: Self-pay | Admitting: Family Medicine

## 2019-08-11 ENCOUNTER — Telehealth: Payer: Self-pay

## 2019-08-11 DIAGNOSIS — M6281 Muscle weakness (generalized): Secondary | ICD-10-CM | POA: Diagnosis not present

## 2019-08-11 DIAGNOSIS — E1122 Type 2 diabetes mellitus with diabetic chronic kidney disease: Secondary | ICD-10-CM | POA: Diagnosis not present

## 2019-08-11 DIAGNOSIS — Z7984 Long term (current) use of oral hypoglycemic drugs: Secondary | ICD-10-CM | POA: Diagnosis not present

## 2019-08-11 DIAGNOSIS — Z4781 Encounter for orthopedic aftercare following surgical amputation: Secondary | ICD-10-CM | POA: Diagnosis not present

## 2019-08-11 DIAGNOSIS — D631 Anemia in chronic kidney disease: Secondary | ICD-10-CM | POA: Diagnosis not present

## 2019-08-11 DIAGNOSIS — Z86718 Personal history of other venous thrombosis and embolism: Secondary | ICD-10-CM | POA: Diagnosis not present

## 2019-08-11 DIAGNOSIS — E785 Hyperlipidemia, unspecified: Secondary | ICD-10-CM | POA: Diagnosis not present

## 2019-08-11 DIAGNOSIS — Z89511 Acquired absence of right leg below knee: Secondary | ICD-10-CM | POA: Diagnosis not present

## 2019-08-11 DIAGNOSIS — E1151 Type 2 diabetes mellitus with diabetic peripheral angiopathy without gangrene: Secondary | ICD-10-CM | POA: Diagnosis not present

## 2019-08-11 DIAGNOSIS — Z7901 Long term (current) use of anticoagulants: Secondary | ICD-10-CM | POA: Diagnosis not present

## 2019-08-11 DIAGNOSIS — I10 Essential (primary) hypertension: Secondary | ICD-10-CM | POA: Diagnosis not present

## 2019-08-11 NOTE — Telephone Encounter (Signed)
Please advise 

## 2019-08-11 NOTE — Telephone Encounter (Signed)
ok 

## 2019-08-11 NOTE — Telephone Encounter (Signed)
Copied from [place] [phone]. Topic: General - Other >> Aug 11, 2019  9:26 AM Parke Poisson wrote: Reason for CRM: Ronalee Belts from [facility] states that pt would benefit from OT and social work if that could be added to referral. They do not have home health aid available at this time

## 2019-08-11 NOTE — Telephone Encounter (Signed)
Home Health Verbal Orders - Caller/Agency: Anna Genre [facility] Callback Number: [phone] vm can be left  Requesting Speech Therapy Frequency: 1 x a week for 2 weeks  2x's a week for 2 weeks

## 2019-08-12 ENCOUNTER — Encounter: Payer: Self-pay | Admitting: *Deleted

## 2019-08-12 DIAGNOSIS — M6281 Muscle weakness (generalized): Secondary | ICD-10-CM | POA: Diagnosis not present

## 2019-08-12 DIAGNOSIS — D631 Anemia in chronic kidney disease: Secondary | ICD-10-CM | POA: Diagnosis not present

## 2019-08-12 DIAGNOSIS — E1122 Type 2 diabetes mellitus with diabetic chronic kidney disease: Secondary | ICD-10-CM | POA: Diagnosis not present

## 2019-08-12 DIAGNOSIS — Z7984 Long term (current) use of oral hypoglycemic drugs: Secondary | ICD-10-CM | POA: Diagnosis not present

## 2019-08-12 DIAGNOSIS — Z86718 Personal history of other venous thrombosis and embolism: Secondary | ICD-10-CM | POA: Diagnosis not present

## 2019-08-12 DIAGNOSIS — E1151 Type 2 diabetes mellitus with diabetic peripheral angiopathy without gangrene: Secondary | ICD-10-CM | POA: Diagnosis not present

## 2019-08-12 DIAGNOSIS — Z89511 Acquired absence of right leg below knee: Secondary | ICD-10-CM | POA: Diagnosis not present

## 2019-08-12 DIAGNOSIS — Z7901 Long term (current) use of anticoagulants: Secondary | ICD-10-CM | POA: Diagnosis not present

## 2019-08-12 DIAGNOSIS — Z4781 Encounter for orthopedic aftercare following surgical amputation: Secondary | ICD-10-CM | POA: Diagnosis not present

## 2019-08-12 DIAGNOSIS — I10 Essential (primary) hypertension: Secondary | ICD-10-CM | POA: Diagnosis not present

## 2019-08-12 DIAGNOSIS — E785 Hyperlipidemia, unspecified: Secondary | ICD-10-CM | POA: Diagnosis not present

## 2019-08-12 NOTE — Telephone Encounter (Signed)
Advised 

## 2019-08-12 NOTE — Telephone Encounter (Signed)
ok 

## 2019-08-13 DIAGNOSIS — Z4781 Encounter for orthopedic aftercare following surgical amputation: Secondary | ICD-10-CM | POA: Diagnosis not present

## 2019-08-13 DIAGNOSIS — E1122 Type 2 diabetes mellitus with diabetic chronic kidney disease: Secondary | ICD-10-CM | POA: Diagnosis not present

## 2019-08-13 DIAGNOSIS — Z7901 Long term (current) use of anticoagulants: Secondary | ICD-10-CM | POA: Diagnosis not present

## 2019-08-13 DIAGNOSIS — Z7984 Long term (current) use of oral hypoglycemic drugs: Secondary | ICD-10-CM | POA: Diagnosis not present

## 2019-08-13 DIAGNOSIS — E785 Hyperlipidemia, unspecified: Secondary | ICD-10-CM | POA: Diagnosis not present

## 2019-08-13 DIAGNOSIS — D631 Anemia in chronic kidney disease: Secondary | ICD-10-CM | POA: Diagnosis not present

## 2019-08-13 DIAGNOSIS — Z89511 Acquired absence of right leg below knee: Secondary | ICD-10-CM | POA: Diagnosis not present

## 2019-08-13 DIAGNOSIS — I10 Essential (primary) hypertension: Secondary | ICD-10-CM | POA: Diagnosis not present

## 2019-08-13 DIAGNOSIS — Z86718 Personal history of other venous thrombosis and embolism: Secondary | ICD-10-CM | POA: Diagnosis not present

## 2019-08-13 DIAGNOSIS — M6281 Muscle weakness (generalized): Secondary | ICD-10-CM | POA: Diagnosis not present

## 2019-08-13 DIAGNOSIS — E1151 Type 2 diabetes mellitus with diabetic peripheral angiopathy without gangrene: Secondary | ICD-10-CM | POA: Diagnosis not present

## 2019-08-14 DIAGNOSIS — Z7901 Long term (current) use of anticoagulants: Secondary | ICD-10-CM | POA: Diagnosis not present

## 2019-08-14 DIAGNOSIS — E1122 Type 2 diabetes mellitus with diabetic chronic kidney disease: Secondary | ICD-10-CM | POA: Diagnosis not present

## 2019-08-14 DIAGNOSIS — Z89511 Acquired absence of right leg below knee: Secondary | ICD-10-CM | POA: Diagnosis not present

## 2019-08-14 DIAGNOSIS — M6281 Muscle weakness (generalized): Secondary | ICD-10-CM | POA: Diagnosis not present

## 2019-08-14 DIAGNOSIS — E1151 Type 2 diabetes mellitus with diabetic peripheral angiopathy without gangrene: Secondary | ICD-10-CM | POA: Diagnosis not present

## 2019-08-14 DIAGNOSIS — E785 Hyperlipidemia, unspecified: Secondary | ICD-10-CM | POA: Diagnosis not present

## 2019-08-14 DIAGNOSIS — Z86718 Personal history of other venous thrombosis and embolism: Secondary | ICD-10-CM | POA: Diagnosis not present

## 2019-08-14 DIAGNOSIS — D631 Anemia in chronic kidney disease: Secondary | ICD-10-CM | POA: Diagnosis not present

## 2019-08-14 DIAGNOSIS — Z4781 Encounter for orthopedic aftercare following surgical amputation: Secondary | ICD-10-CM | POA: Diagnosis not present

## 2019-08-14 DIAGNOSIS — I10 Essential (primary) hypertension: Secondary | ICD-10-CM | POA: Diagnosis not present

## 2019-08-14 DIAGNOSIS — Z7984 Long term (current) use of oral hypoglycemic drugs: Secondary | ICD-10-CM | POA: Diagnosis not present

## 2019-08-17 ENCOUNTER — Other Ambulatory Visit: Payer: Medicare Other | Admitting: Adult Health Nurse Practitioner

## 2019-08-17 ENCOUNTER — Other Ambulatory Visit: Payer: Self-pay

## 2019-08-17 DIAGNOSIS — Z515 Encounter for palliative care: Secondary | ICD-10-CM | POA: Diagnosis not present

## 2019-08-17 DIAGNOSIS — I509 Heart failure, unspecified: Secondary | ICD-10-CM

## 2019-08-17 NOTE — Progress Notes (Signed)
[facility] Consult Note Telephone: [phone]  Fax: [phone]  PATIENT NAME: Melanie Diaz DOB: 26-May-1933 MRN: [id number]  PRIMARY CARE PROVIDER:   [name]., MD  REFERRING PROVIDER:  [name]., MD [address] [place],  [place] [address]  RESPONSIBLE PARTY:   Loistine Chance, daughter [phone]      RECOMMENDATIONS and PLAN:  1.  Advanced care planning.  Patient is a full code.  Patient does not want to discuss ACP today.  Daughter does indicate that this is something her mother does not like to discuss.    2.  Protein calorie malnutrition.  Patient still has poor appetite. Though daughter states she is starting to eat a little more. She states that things don't have any taste or smell and that food does not feel good in her mouth.  She is supplementing with fortified breakfast drinks.  She gets plenty to drink throughout the day but may only get a half cup of solid food a day.  Encouraged her to try to get more calories as her body is healing.  The nonpitting edema in her arms and breast are more likely due to protein deficiency.  Patient is on remeron 15 mg QHS.  May need to start marinol to see this increases in her appetite. Patient is being followed by GI and she will be starting with ST.  Will reevaluate in 2 weeks at next appointment  3.  Functional status.  Patient is still bedridden and requires help to sit up.  She is working with PT but is not able to stand or walk at this time.  Discussed that she needs to be taking in more calories to help give her the strength to work with PT.  Daughter does state that they will sit her up in her wheelchair to make the bed and she gets worn out sitting up for that long.  Continue to work with PT as ordered  Patient not improving much.  Discussed hospice again with the daughter.  Patient not mentally ready to discuss hospice.  Will continue to evaluate  every couple of weeks for symptom management/decline and make recommendations as needed.  I spent 50 minutes providing this consultation,  from 3:00 to 3:50 including time with patient/family, chart review, provider coordination, and documentation. More than 50% of the time in this consultation was spent coordinating communication.   HISTORY OF PRESENT ILLNESS:  Melanie Diaz is a 86 y.o. year old female with multiple medical problems including PCM, CKD, anemia, DMT2, HTN, PAD, h/o DVT, depression. Palliative Care was asked to help address goals of care.   CODE STATUS: full code  PPS: 30% HOSPICE ELIGIBILITY/DIAGNOSIS: TBD  PHYSICAL EXAM:  BP 132/58  HR 59  O2 88-90% on RA General: NAD, frail appearing, thin Cardiovascular: regular rate and rhythm Pulmonary: lung sounds clear; normal respiratory effort Abdomen: soft, nontender, + bowel sounds Extremities: Has non pitting edema noted to bilateral upper extremities; right BKA Neurological: Weakness but otherwise nonfocal   PAST MEDICAL HISTORY:  Past Medical History:  Diagnosis Date  . Depression   . Diabetes mellitus without complication ([place])   . History of deep vein thrombosis    about 30 years ago  . Hyperlipidemia 07/06/2005  . Hypertension     SOCIAL HX:  Social History   Tobacco Use  . Smoking status: Never Smoker  . Smokeless tobacco: Never Used  . Tobacco comment: smoking  cessation materials not required  Substance Use Topics  . Alcohol use: No    ALLERGIES:  Allergies  Allergen Reactions  . Amoxicillin Other (See Comments)    Has patient had a PCN reaction causing immediate rash, facial/tongue/throat swelling, SOB or lightheadedness with hypotension: Unknown Has patient had a PCN reaction causing severe rash involving mucus membranes or skin necrosis: Unknown Has patient had a PCN reaction that required hospitalization: Unknown Has patient had a PCN reaction occurring within the last 10 years: Unknown If  all of the above answers are "NO", then may proceed with Cephalosporin use.   . Azithromycin     vomiting and stomach upset     PERTINENT MEDICATIONS:  Outpatient Encounter Medications as of 08/17/2019  Medication Sig  . amLODipine (NORVASC) 10 MG tablet Take 1 tablet (10 mg total) by mouth daily.  Marland Kitchen apixaban (ELIQUIS) 2.5 MG TABS tablet Take 1 tablet (2.5 mg total) by mouth 2 (two) times daily.  Marland Kitchen atorvastatin (LIPITOR) 40 MG tablet Take 1 tablet (40 mg total) by mouth daily at 6 PM.  . bisacodyl (DULCOLAX) 10 MG suppository Place 1 suppository (10 mg total) rectally daily as needed for moderate constipation.  . furosemide (LASIX) 20 MG tablet Take 1 tablet (20 mg total) by mouth daily.  Marland Kitchen glipiZIDE (GLUCOTROL) 10 MG tablet Take 1 tablet (10 mg total) by mouth daily.  . hydrALAZINE (APRESOLINE) 25 MG tablet Take 3 tablets (75 mg total) by mouth every 8 (eight) hours.  Marland Kitchen levothyroxine (SYNTHROID) 50 MCG tablet Take 1 tablet (50 mcg total) by mouth daily at 6 (six) AM.  . losartan (COZAAR) 100 MG tablet Take 1 tablet (100 mg total) by mouth daily.  . mirtazapine (REMERON) 30 MG tablet Take 1 tablet (30 mg total) by mouth at bedtime.  Marland Kitchen OLANZapine (ZYPREXA) 2.5 MG tablet Take 1 tablet (2.5 mg total) by mouth at bedtime.  . ondansetron (ZOFRAN) 4 MG tablet Take 1 tablet (4 mg total) by mouth every 6 (six) hours as needed for nausea.  . pantoprazole (PROTONIX) 40 MG tablet Take 1 tablet (40 mg total) by mouth 2 (two) times daily.  . sodium bicarbonate 650 MG tablet Take 1 tablet (650 mg total) by mouth 3 (three) times daily. (Patient not taking: Reported on 08/05/2019)   No facility-administered encounter medications on file as of 08/17/2019.     Josselyne Onofrio Jenetta Downer, NP

## 2019-08-18 DIAGNOSIS — Z86718 Personal history of other venous thrombosis and embolism: Secondary | ICD-10-CM | POA: Diagnosis not present

## 2019-08-18 DIAGNOSIS — Z89511 Acquired absence of right leg below knee: Secondary | ICD-10-CM | POA: Diagnosis not present

## 2019-08-18 DIAGNOSIS — E1151 Type 2 diabetes mellitus with diabetic peripheral angiopathy without gangrene: Secondary | ICD-10-CM | POA: Diagnosis not present

## 2019-08-18 DIAGNOSIS — Z7984 Long term (current) use of oral hypoglycemic drugs: Secondary | ICD-10-CM | POA: Diagnosis not present

## 2019-08-18 DIAGNOSIS — E1122 Type 2 diabetes mellitus with diabetic chronic kidney disease: Secondary | ICD-10-CM | POA: Diagnosis not present

## 2019-08-18 DIAGNOSIS — Z7901 Long term (current) use of anticoagulants: Secondary | ICD-10-CM | POA: Diagnosis not present

## 2019-08-18 DIAGNOSIS — Z4781 Encounter for orthopedic aftercare following surgical amputation: Secondary | ICD-10-CM | POA: Diagnosis not present

## 2019-08-18 DIAGNOSIS — M6281 Muscle weakness (generalized): Secondary | ICD-10-CM | POA: Diagnosis not present

## 2019-08-18 DIAGNOSIS — I10 Essential (primary) hypertension: Secondary | ICD-10-CM | POA: Diagnosis not present

## 2019-08-18 DIAGNOSIS — D631 Anemia in chronic kidney disease: Secondary | ICD-10-CM | POA: Diagnosis not present

## 2019-08-18 DIAGNOSIS — E785 Hyperlipidemia, unspecified: Secondary | ICD-10-CM | POA: Diagnosis not present

## 2019-08-19 ENCOUNTER — Other Ambulatory Visit: Payer: Self-pay | Admitting: Family Medicine

## 2019-08-19 NOTE — Telephone Encounter (Signed)
[facility] faxed refill request for the following medications:  levothyroxine (SYNTHROID) 50 MCG tablet sodium bicarbonate 10 grain tablet losartan (COZAAR) 100 MG tablet ondansetron (ZOFRAN) 4 MG tablet hydrALAZINE (APRESOLINE) 25 MG tablet  Please advise.  Thanks, [employer]

## 2019-08-20 DIAGNOSIS — Z7901 Long term (current) use of anticoagulants: Secondary | ICD-10-CM | POA: Diagnosis not present

## 2019-08-20 DIAGNOSIS — E1151 Type 2 diabetes mellitus with diabetic peripheral angiopathy without gangrene: Secondary | ICD-10-CM | POA: Diagnosis not present

## 2019-08-20 DIAGNOSIS — Z7984 Long term (current) use of oral hypoglycemic drugs: Secondary | ICD-10-CM | POA: Diagnosis not present

## 2019-08-20 DIAGNOSIS — M6281 Muscle weakness (generalized): Secondary | ICD-10-CM | POA: Diagnosis not present

## 2019-08-20 DIAGNOSIS — Z4781 Encounter for orthopedic aftercare following surgical amputation: Secondary | ICD-10-CM | POA: Diagnosis not present

## 2019-08-20 DIAGNOSIS — Z86718 Personal history of other venous thrombosis and embolism: Secondary | ICD-10-CM | POA: Diagnosis not present

## 2019-08-20 DIAGNOSIS — Z89511 Acquired absence of right leg below knee: Secondary | ICD-10-CM | POA: Diagnosis not present

## 2019-08-20 DIAGNOSIS — I10 Essential (primary) hypertension: Secondary | ICD-10-CM | POA: Diagnosis not present

## 2019-08-20 DIAGNOSIS — E785 Hyperlipidemia, unspecified: Secondary | ICD-10-CM | POA: Diagnosis not present

## 2019-08-20 DIAGNOSIS — D631 Anemia in chronic kidney disease: Secondary | ICD-10-CM | POA: Diagnosis not present

## 2019-08-20 DIAGNOSIS — E1122 Type 2 diabetes mellitus with diabetic chronic kidney disease: Secondary | ICD-10-CM | POA: Diagnosis not present

## 2019-08-20 MED ORDER — HYDRALAZINE HCL 25 MG PO TABS: 75.0000 mg | ORAL_TABLET | Freq: Three times a day (TID) | ORAL | 0 refills | Status: DC

## 2019-08-20 MED ORDER — LEVOTHYROXINE SODIUM 50 MCG PO TABS: 50.0000 ug | ORAL_TABLET | Freq: Every day | ORAL | 0 refills | Status: DC

## 2019-08-20 MED ORDER — SODIUM BICARBONATE 650 MG PO TABS: 650.0000 mg | ORAL_TABLET | Freq: Three times a day (TID) | ORAL | 0 refills | Status: AC

## 2019-08-20 MED ORDER — ONDANSETRON HCL 4 MG PO TABS: 4.0000 mg | ORAL_TABLET | Freq: Four times a day (QID) | ORAL | 0 refills | Status: AC | PRN

## 2019-08-20 MED ORDER — LOSARTAN POTASSIUM 100 MG PO TABS: 100.0000 mg | ORAL_TABLET | ORAL | 0 refills | Status: DC

## 2019-08-20 NOTE — Telephone Encounter (Signed)
Please advise refills? They were given to patient in hospital.

## 2019-08-21 DIAGNOSIS — Z86718 Personal history of other venous thrombosis and embolism: Secondary | ICD-10-CM | POA: Diagnosis not present

## 2019-08-21 DIAGNOSIS — E1151 Type 2 diabetes mellitus with diabetic peripheral angiopathy without gangrene: Secondary | ICD-10-CM | POA: Diagnosis not present

## 2019-08-21 DIAGNOSIS — Z7901 Long term (current) use of anticoagulants: Secondary | ICD-10-CM | POA: Diagnosis not present

## 2019-08-21 DIAGNOSIS — Z89511 Acquired absence of right leg below knee: Secondary | ICD-10-CM | POA: Diagnosis not present

## 2019-08-21 DIAGNOSIS — I10 Essential (primary) hypertension: Secondary | ICD-10-CM | POA: Diagnosis not present

## 2019-08-21 DIAGNOSIS — E785 Hyperlipidemia, unspecified: Secondary | ICD-10-CM | POA: Diagnosis not present

## 2019-08-21 DIAGNOSIS — D631 Anemia in chronic kidney disease: Secondary | ICD-10-CM | POA: Diagnosis not present

## 2019-08-21 DIAGNOSIS — M6281 Muscle weakness (generalized): Secondary | ICD-10-CM | POA: Diagnosis not present

## 2019-08-21 DIAGNOSIS — E1122 Type 2 diabetes mellitus with diabetic chronic kidney disease: Secondary | ICD-10-CM | POA: Diagnosis not present

## 2019-08-21 DIAGNOSIS — Z7984 Long term (current) use of oral hypoglycemic drugs: Secondary | ICD-10-CM | POA: Diagnosis not present

## 2019-08-21 DIAGNOSIS — Z4781 Encounter for orthopedic aftercare following surgical amputation: Secondary | ICD-10-CM | POA: Diagnosis not present

## 2019-08-24 ENCOUNTER — Telehealth: Payer: Self-pay

## 2019-08-24 ENCOUNTER — Telehealth: Payer: Self-pay | Admitting: Family Medicine

## 2019-08-24 NOTE — Telephone Encounter (Signed)
Home Health Verbal Orders - Caller/Agency: Melanie Diaz// [facility] Number: [phone] SKAJGO TLXB w/ First and last name and title Requesting OT/PT/Skilled Nursing/Social Work/Speech Therapy: Melanie Diaz for Evaluation and Admit//  Frequency: n/a  Melanie Soda is also requesting to know if pcp would like to continue to be attending physician or turn over to hospice director. She states that this urgent. Please advise.

## 2019-08-24 NOTE — Telephone Encounter (Signed)
Form faxed

## 2019-08-24 NOTE — Telephone Encounter (Signed)
Verbal order was given to [facility]. Also per Dr. Rosanna Randy, will turn care over to hospice director.

## 2019-08-24 NOTE — Telephone Encounter (Signed)
Copied from [place] [phone]. Topic: General - Inquiry >> Aug 24, 2019  9:18 AM Lennox Solders wrote: Reason for JAR:[facility] dropped off a form on 08-20-2019 and she is calling to see if the form is ready for pick up or the office can fax to [phone]

## 2019-08-25 ENCOUNTER — Other Ambulatory Visit: Payer: Self-pay | Admitting: Family Medicine

## 2019-08-25 MED ORDER — PANTOPRAZOLE SODIUM 40 MG PO TBEC: 40.0000 mg | DELAYED_RELEASE_TABLET | Freq: Two times a day (BID) | ORAL | 0 refills | Status: AC

## 2019-08-25 NOTE — Telephone Encounter (Signed)
[facility] faxed refill request for the following medications:  pantoprazole (PROTONIX) 40 MG tablet   Please advise.  Thanks, [employer]

## 2019-08-25 NOTE — Telephone Encounter (Signed)
Please advise? Medication has never been filled by you.  

## 2019-08-27 ENCOUNTER — Ambulatory Visit: Payer: Self-pay | Admitting: Family Medicine

## 2019-08-28 DIAGNOSIS — I129 Hypertensive chronic kidney disease with stage 1 through stage 4 chronic kidney disease, or unspecified chronic kidney disease: Secondary | ICD-10-CM | POA: Diagnosis not present

## 2019-08-28 DIAGNOSIS — R269 Unspecified abnormalities of gait and mobility: Secondary | ICD-10-CM | POA: Diagnosis not present

## 2019-08-31 ENCOUNTER — Other Ambulatory Visit: Payer: Medicare Other | Admitting: Adult Health Nurse Practitioner

## 2019-09-04 ENCOUNTER — Other Ambulatory Visit: Payer: Self-pay | Admitting: Family Medicine

## 2019-09-04 DIAGNOSIS — R42 Dizziness and giddiness: Secondary | ICD-10-CM

## 2019-09-06 ENCOUNTER — Other Ambulatory Visit: Payer: Self-pay | Admitting: Physician Assistant

## 2019-09-06 NOTE — Telephone Encounter (Signed)
Requested Prescriptions  Pending Prescriptions Disp Refills  . hydrALAZINE (APRESOLINE) 25 MG tablet [Pharmacy Med Name: HYDRALAZINE 25 MG TABLET] 90 tablet 0    Sig: Take 3 tablets (75 mg total) by mouth every 8 (eight) hours.     Cardiovascular:  Vasodilators Failed - 09/06/2019 12:59 PM      Failed - HCT in normal range and within 360 days    HCT  Date Value Ref Range Status  07/28/2019 27.2 (L) 36.0 - 46.0 % Final   Hematocrit  Date Value Ref Range Status  07/15/2019 22.4 (L) 34.0 - 46.6 % Final         Failed - HGB in normal range and within 360 days    Hemoglobin  Date Value Ref Range Status  07/28/2019 8.7 (L) 12.0 - 15.0 g/dL Final  07/15/2019 7.3 (L) 11.1 - 15.9 g/dL Final         Failed - RBC in normal range and within 360 days    RBC  Date Value Ref Range Status  07/28/2019 3.07 (L) 3.87 - 5.11 MIL/uL Final         Failed - Last BP in normal range    BP Readings from Last 1 Encounters:  07/28/19 (!) 153/43         Passed - WBC in normal range and within 360 days    WBC  Date Value Ref Range Status  07/28/2019 9.3 4.0 - 10.5 K/uL Final         Passed - PLT in normal range and within 360 days    Platelets  Date Value Ref Range Status  07/28/2019 385 150 - 400 K/uL Final  07/15/2019 386 150 - 450 x10E3/uL Final         Passed - Valid encounter within last 12 months    Recent Outpatient Visits          1 month ago Type 2 diabetes mellitus with other specified complication, without long-term current use of insulin [facility])   [facility] [name]., MD   6 months ago Essential hypertension   [facility] [name]., MD   7 months ago Essential hypertension   [facility] [name]., MD   8 months ago Type 2 diabetes mellitus with other specified complication, without long-term current use of insulin [facility])   [facility] [name]., MD   8 months ago  Cellulitis, unspecified cellulitis site   [facility] [name]., MD

## 2019-09-11 ENCOUNTER — Other Ambulatory Visit: Payer: Self-pay | Admitting: Family Medicine

## 2019-09-11 DIAGNOSIS — I10 Essential (primary) hypertension: Secondary | ICD-10-CM

## 2019-09-14 ENCOUNTER — Other Ambulatory Visit: Payer: Self-pay | Admitting: Family Medicine

## 2019-09-14 ENCOUNTER — Other Ambulatory Visit: Payer: Self-pay | Admitting: Physician Assistant

## 2019-09-14 NOTE — Telephone Encounter (Signed)
Requested medication (s) are due for refill today: yes  Requested medication (s) are on the active medication list: yes  Last refill:  09/09/18  Future visit scheduled: no  Notes to clinic:  overdue for labwork   Requested Prescriptions  Pending Prescriptions Disp Refills   atorvastatin (LIPITOR) 40 MG tablet [Pharmacy Med Name: ATORVASTATIN 40 MG TABLET] 30 tablet 0    Sig: Take 1 tablet (40 mg total) by mouth daily at 6 PM.      Cardiovascular:  Antilipid - Statins Failed - 09/14/2019  4:16 PM      Failed - Total Cholesterol in normal range and within 360 days    Cholesterol, Total  Date Value Ref Range Status  11/20/2017 131 100 - 199 mg/dL Final          Failed - LDL in normal range and within 360 days    LDL Calculated  Date Value Ref Range Status  11/20/2017 76 0 - 99 mg/dL Final          Failed - HDL in normal range and within 360 days    HDL  Date Value Ref Range Status  11/20/2017 39 (L) >39 mg/dL Final          Failed - Triglycerides in normal range and within 360 days    Triglycerides  Date Value Ref Range Status  11/20/2017 81 0 - 149 mg/dL Final          Passed - Patient is not pregnant      Passed - Valid encounter within last 12 months    Recent Outpatient Visits           2 months ago Type 2 diabetes mellitus with other specified complication, without long-term current use of insulin ([place])   [facility] [name]., MD   7 months ago Essential hypertension   [facility] [name]., MD   7 months ago Essential hypertension   [facility] [name]., MD   8 months ago Type 2 diabetes mellitus with other specified complication, without long-term current use of insulin [facility])   [facility] [name]., MD   9 months ago Cellulitis, unspecified cellulitis site   [facility] [name]., MD

## 2019-09-14 NOTE — Telephone Encounter (Signed)
Last lipids 11/2017

## 2019-09-15 ENCOUNTER — Other Ambulatory Visit: Payer: Self-pay | Admitting: Physician Assistant

## 2019-09-15 ENCOUNTER — Telehealth: Payer: Self-pay | Admitting: Family Medicine

## 2019-09-15 NOTE — Telephone Encounter (Signed)
[facility] faxed refill request for the following medications:  apixaban (ELIQUIS) 2.5 MG TABS tablet   Please advise.  Thanks, [employer]

## 2019-09-15 NOTE — Telephone Encounter (Signed)
Patient is no longer under our care, view messages from 08/24/19.

## 2019-09-16 ENCOUNTER — Other Ambulatory Visit: Payer: Self-pay | Admitting: Physician Assistant

## 2019-09-22 ENCOUNTER — Telehealth: Payer: Self-pay | Admitting: Family Medicine

## 2019-09-22 NOTE — Chronic Care Management (AMB) (Signed)
  Care Management   Note  09/22/2019 Name: JAI BEAR MRN: [id number] DOB: [birth year]  KARLIE AUNG is a 86 y.o. year old female who is a primary care patient of [name]., MD and is actively engaged with the care management team. I reached out to Pieter Partridge by phone today to assist with scheduling an initial visit with the Pharmacist  Follow up plan: Patient's daughter Janace Hoard declines further follow up and engagement by the care management team due to patient being under Hospice care at this time. Appropriate care team members and provider have been notified via electronic communication.   Noreene Larsson, [place], [place],  [address] Direct Dial: [phone] [email or web address]: [email or web address]

## 2019-09-27 DIAGNOSIS — R269 Unspecified abnormalities of gait and mobility: Secondary | ICD-10-CM | POA: Diagnosis not present

## 2019-09-27 DIAGNOSIS — I129 Hypertensive chronic kidney disease with stage 1 through stage 4 chronic kidney disease, or unspecified chronic kidney disease: Secondary | ICD-10-CM | POA: Diagnosis not present

## 2019-10-28 DIAGNOSIS — R269 Unspecified abnormalities of gait and mobility: Secondary | ICD-10-CM | POA: Diagnosis not present

## 2019-10-28 DIAGNOSIS — I129 Hypertensive chronic kidney disease with stage 1 through stage 4 chronic kidney disease, or unspecified chronic kidney disease: Secondary | ICD-10-CM | POA: Diagnosis not present

## 2019-11-27 DIAGNOSIS — I129 Hypertensive chronic kidney disease with stage 1 through stage 4 chronic kidney disease, or unspecified chronic kidney disease: Secondary | ICD-10-CM | POA: Diagnosis not present

## 2019-11-27 DIAGNOSIS — R269 Unspecified abnormalities of gait and mobility: Secondary | ICD-10-CM | POA: Diagnosis not present

## 2019-12-28 DIAGNOSIS — I129 Hypertensive chronic kidney disease with stage 1 through stage 4 chronic kidney disease, or unspecified chronic kidney disease: Secondary | ICD-10-CM | POA: Diagnosis not present

## 2019-12-28 DIAGNOSIS — R269 Unspecified abnormalities of gait and mobility: Secondary | ICD-10-CM | POA: Diagnosis not present

## 2020-01-28 DIAGNOSIS — I129 Hypertensive chronic kidney disease with stage 1 through stage 4 chronic kidney disease, or unspecified chronic kidney disease: Secondary | ICD-10-CM | POA: Diagnosis not present

## 2020-01-28 DIAGNOSIS — R269 Unspecified abnormalities of gait and mobility: Secondary | ICD-10-CM | POA: Diagnosis not present

## 2020-02-01 ENCOUNTER — Other Ambulatory Visit: Payer: Self-pay | Admitting: Family Medicine

## 2020-02-01 DIAGNOSIS — F329 Major depressive disorder, single episode, unspecified: Secondary | ICD-10-CM

## 2020-02-17 ENCOUNTER — Other Ambulatory Visit: Payer: Self-pay | Admitting: Family Medicine

## 2020-02-17 DIAGNOSIS — F329 Major depressive disorder, single episode, unspecified: Secondary | ICD-10-CM

## 2020-02-22 ENCOUNTER — Telehealth: Payer: Self-pay | Admitting: Family Medicine

## 2020-02-22 ENCOUNTER — Other Ambulatory Visit: Payer: Self-pay | Admitting: Family Medicine

## 2020-02-22 DIAGNOSIS — F329 Major depressive disorder, single episode, unspecified: Secondary | ICD-10-CM

## 2020-02-22 NOTE — Telephone Encounter (Signed)
Copied from [place] [phone]. Topic: Medicare AWV >> Feb 22, 2020  2:32 PM Cher Nakai R wrote: Reason for CRM:   No answer unable to leave message for patient to call back and schedule Medicare Annual Wellness Visit (AWV) either virtually or in office.  Last AWV 02/17/2019  Please schedule at anytime with [facility] Health Advisor.  If any questions, please contact me at [phone]

## 2020-02-27 DIAGNOSIS — I129 Hypertensive chronic kidney disease with stage 1 through stage 4 chronic kidney disease, or unspecified chronic kidney disease: Secondary | ICD-10-CM | POA: Diagnosis not present

## 2020-02-27 DIAGNOSIS — R269 Unspecified abnormalities of gait and mobility: Secondary | ICD-10-CM | POA: Diagnosis not present

## 2020-03-02 ENCOUNTER — Other Ambulatory Visit: Payer: Self-pay | Admitting: Family Medicine

## 2020-03-02 DIAGNOSIS — F329 Major depressive disorder, single episode, unspecified: Secondary | ICD-10-CM

## 2020-03-07 ENCOUNTER — Encounter: Payer: Self-pay | Admitting: Family Medicine

## 2020-03-07 ENCOUNTER — Telehealth (INDEPENDENT_AMBULATORY_CARE_PROVIDER_SITE_OTHER): Payer: Medicare Other | Admitting: Family Medicine

## 2020-03-07 DIAGNOSIS — E1169 Type 2 diabetes mellitus with other specified complication: Secondary | ICD-10-CM | POA: Diagnosis not present

## 2020-03-07 DIAGNOSIS — E1151 Type 2 diabetes mellitus with diabetic peripheral angiopathy without gangrene: Secondary | ICD-10-CM

## 2020-03-07 DIAGNOSIS — E440 Moderate protein-calorie malnutrition: Secondary | ICD-10-CM | POA: Diagnosis not present

## 2020-03-07 DIAGNOSIS — R627 Adult failure to thrive: Secondary | ICD-10-CM

## 2020-03-07 DIAGNOSIS — F329 Major depressive disorder, single episode, unspecified: Secondary | ICD-10-CM | POA: Diagnosis not present

## 2020-03-07 DIAGNOSIS — R630 Anorexia: Secondary | ICD-10-CM

## 2020-03-07 DIAGNOSIS — S88919A Complete traumatic amputation of unspecified lower leg, level unspecified, initial encounter: Secondary | ICD-10-CM

## 2020-03-07 MED ORDER — MIRTAZAPINE 30 MG PO TABS: 30.0000 mg | ORAL_TABLET | Freq: Every day | ORAL | 5 refills | Status: AC

## 2020-03-07 NOTE — Progress Notes (Signed)
MyChart Video Visit    Virtual Visit via Video Note   This visit type was conducted due to national recommendations for restrictions regarding the COVID-19 Pandemic (e.g. social distancing) in an effort to limit this patient's exposure and mitigate transmission in our community. This patient is at least at moderate risk for complications without adequate follow up. This format is felt to be most appropriate for this patient at this time. Physical exam was limited by quality of the video and audio technology used for the visit.   Patient location: Writer location: Office  I discussed the limitations of evaluation and management by telemedicine and the availability of in person appointments. The patient expressed understanding and agreed to proceed.  Patient: Melanie Diaz   DOB: 1933-06-19   86 y.o. Female  MRN: [id number] Visit Date: 03/07/2020  Today's healthcare provider: Wilhemena Durie, MD   Chief Complaint  Patient presents with   Medication Refill   Subjective    HPI Spoke with Angie patient's daughter on DPR. Patient need refills on Mirtazapine. Reports that she is stable with this and helps with her appetite. This is mostly a virtual visit for the patient but the daughter is running grandchildren to school so this is a brief phone visit with the daughter.  The patient is bedbound and palliative care is involved 3 times a week with visits. The patient moved in with the daughter and son-in-law few months ago.  She has been out of the bed to wheelchair maybe 2 or 3 times according to the daughter.  She is off of glipizide.  She has not had lab work in at least 7 months.  She evidently is still taking Eliquis. Palliative care tried to talk to her about CODE STATUS and advanced care planning and patient and daughter are not interested in having this discussion. Past Medical History:  Diagnosis Date   Depression    Diabetes mellitus without complication ([place])     History of deep vein thrombosis    about 30 years ago   Hyperlipidemia 07/06/2005   Hypertension       Medications: Outpatient Medications Prior to Visit  Medication Sig   amLODipine (NORVASC) 10 MG tablet Take 1 tablet (10 mg total) by mouth daily.   apixaban (ELIQUIS) 2.5 MG TABS tablet Take 1 tablet (2.5 mg total) by mouth 2 (two) times daily.   atorvastatin (LIPITOR) 40 MG tablet Take 1 tablet (40 mg total) by mouth daily at 6 PM.   furosemide (LASIX) 20 MG tablet Take 1 tablet (20 mg total) by mouth daily.   hydrALAZINE (APRESOLINE) 25 MG tablet Take 3 tablets (75 mg total) by mouth every 8 (eight) hours.   levothyroxine (SYNTHROID) 50 MCG tablet Take 1 tablet (50 mcg total) by mouth daily at 6 (six) AM.   losartan (COZAAR) 100 MG tablet Take 1 tablet (100 mg total) by mouth daily.   mirtazapine (REMERON) 30 MG tablet Take 1 tablet (30 mg total) by mouth at bedtime.   ondansetron (ZOFRAN) 4 MG tablet Take 1 tablet (4 mg total) by mouth every 6 (six) hours as needed for nausea.   pantoprazole (PROTONIX) 40 MG tablet Take 1 tablet (40 mg total) by mouth 2 (two) times daily.   bisacodyl (DULCOLAX) 10 MG suppository Place 1 suppository (10 mg total) rectally daily as needed for moderate constipation.   glipiZIDE (GLUCOTROL) 10 MG tablet Take 1 tablet (10 mg total) by mouth daily. (Patient not taking: Reported  on 03/07/2020)   OLANZapine (ZYPREXA) 2.5 MG tablet Take 1 tablet (2.5 mg total) by mouth at bedtime.   sodium bicarbonate 650 MG tablet Take 1 tablet (650 mg total) by mouth 3 (three) times daily. (Patient not taking: Reported on 03/07/2020)   No facility-administered medications prior to visit.    Review of Systems  Last hemoglobin A1c Lab Results  Component Value Date   HGBA1C 5.9 (H) 07/21/2019      Objective    There were no vitals taken for this visit. BP Readings from Last 3 Encounters:  07/28/19 (!) 153/43  07/15/19 (!) 187/49  02/16/19 (!)  142/70   Wt Readings from Last 3 Encounters:  07/26/19 142 lb 4.8 oz (64.5 kg)  02/13/18 117 lb (53.1 kg)  10/14/17 117 lb (53.1 kg)      Physical Exam     Assessment & Plan     1. Failure to thrive in adult Patient is in failing health.  She is always been noncompliant with follow-up and now has not been seen in the office since the spring of the year.  She has not had lab work.  We will refill mirtazapine to help with her appetite but I think otherwise she needs to be in hospice care.  Daughter did not want to discuss advance care planning.  2. Anorexia   3. Reactive depression (situational)  - mirtazapine (REMERON) 30 MG tablet; Take 1 tablet (30 mg total) by mouth at bedtime.  Dispense: 30 tablet; Refill: 5  4. Type 2 diabetes mellitus with other specified complication, without long-term current use of insulin (HCC) Off of the final urea.  5. Malnutrition of moderate degree   6. Amputation of lower limb ([place])   7. Type 2 diabetes mellitus with diabetic peripheral angiopathy without gangrene, without long-term current use of insulin (HCC) Status post amputation   No follow-ups on file.     I discussed the assessment and treatment plan with the patient. The patient was provided an opportunity to ask questions and all were answered. The patient agreed with the plan and demonstrated an understanding of the instructions.   The patient was advised to call back or seek an in-person evaluation if the symptoms worsen or if the condition fails to improve as anticipated.  I provided 12 minutes of non-face-to-face time during this encounter.    Tarrence Enck Cranford Mon, MD [facility] [phone] (phone) [phone] (fax)  [place]

## 2020-03-29 DIAGNOSIS — I129 Hypertensive chronic kidney disease with stage 1 through stage 4 chronic kidney disease, or unspecified chronic kidney disease: Secondary | ICD-10-CM | POA: Diagnosis not present

## 2020-03-29 DIAGNOSIS — R269 Unspecified abnormalities of gait and mobility: Secondary | ICD-10-CM | POA: Diagnosis not present

## 2020-04-28 DIAGNOSIS — R269 Unspecified abnormalities of gait and mobility: Secondary | ICD-10-CM | POA: Diagnosis not present

## 2020-04-28 DIAGNOSIS — I129 Hypertensive chronic kidney disease with stage 1 through stage 4 chronic kidney disease, or unspecified chronic kidney disease: Secondary | ICD-10-CM | POA: Diagnosis not present

## 2020-06-30 DIAGNOSIS — I96 Gangrene, not elsewhere classified: Secondary | ICD-10-CM | POA: Diagnosis not present

## 2020-06-30 DIAGNOSIS — I70229 Atherosclerosis of native arteries of extremities with rest pain, unspecified extremity: Secondary | ICD-10-CM | POA: Diagnosis not present

## 2020-06-30 DIAGNOSIS — M79672 Pain in left foot: Secondary | ICD-10-CM | POA: Diagnosis not present

## 2020-06-30 DIAGNOSIS — G894 Chronic pain syndrome: Secondary | ICD-10-CM | POA: Diagnosis not present

## 2020-06-30 DIAGNOSIS — I739 Peripheral vascular disease, unspecified: Secondary | ICD-10-CM | POA: Diagnosis not present

## 2020-06-30 DIAGNOSIS — E1142 Type 2 diabetes mellitus with diabetic polyneuropathy: Secondary | ICD-10-CM | POA: Diagnosis not present

## 2020-06-30 DIAGNOSIS — B351 Tinea unguium: Secondary | ICD-10-CM | POA: Diagnosis not present

## 2020-06-30 DIAGNOSIS — Z89611 Acquired absence of right leg above knee: Secondary | ICD-10-CM | POA: Diagnosis not present

## 2020-07-04 ENCOUNTER — Telehealth: Payer: Self-pay | Admitting: Family Medicine

## 2020-07-04 NOTE — Telephone Encounter (Signed)
Copied from [place] [id number]. Topic: Medicare AWV >> Jul 04, 2020  2:55 PM Cher Nakai R wrote: Reason for CRM:   No answer unable to leave a message for patient to call back and schedule Medicare Annual Wellness Visit (AWV) in office.   If not able to come in office, please offer to do virtually or by telephone.   Last AWV 02/17/2019  Please schedule at anytime with [facility] Health Advisor.  If any questions, please contact me at [phone]

## 2020-08-12 DEATH — deceased
